# Patient Record
Sex: Female | Born: 1960
Health system: Southern US, Community
[De-identification: ages and names within clinical notes are randomized; demographics above are authoritative.]

## PROBLEM LIST (undated history)

## (undated) DIAGNOSIS — D509 Iron deficiency anemia, unspecified: Secondary | ICD-10-CM

## (undated) DIAGNOSIS — R000 Tachycardia, unspecified: Secondary | ICD-10-CM

## (undated) DIAGNOSIS — I10 Essential (primary) hypertension: Secondary | ICD-10-CM

## (undated) DIAGNOSIS — K219 Gastro-esophageal reflux disease without esophagitis: Secondary | ICD-10-CM

## (undated) DIAGNOSIS — R011 Cardiac murmur, unspecified: Secondary | ICD-10-CM

## (undated) DIAGNOSIS — E559 Vitamin D deficiency, unspecified: Secondary | ICD-10-CM

## (undated) DIAGNOSIS — E0590 Thyrotoxicosis, unspecified without thyrotoxic crisis or storm: Secondary | ICD-10-CM

## (undated) HISTORY — PX: ABDOMINAL HYSTERECTOMY: SHX81

## (undated) HISTORY — DX: Essential (primary) hypertension: I10

## (undated) HISTORY — PX: BREAST CYST ASPIRATION: SHX578

## (undated) HISTORY — DX: Cardiac murmur, unspecified: R01.1

## (undated) HISTORY — DX: Tachycardia, unspecified: R00.0

## (undated) HISTORY — DX: Thyrotoxicosis, unspecified without thyrotoxic crisis or storm: E05.90

## (undated) HISTORY — DX: Vitamin D deficiency, unspecified: E55.9

## (undated) HISTORY — DX: Gastro-esophageal reflux disease without esophagitis: K21.9

## (undated) HISTORY — PX: BREAST EXCISIONAL BIOPSY: SUR124

---
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
## 1999-04-13 HISTORY — PX: VAGINAL HYSTERECTOMY: SHX2639

## 2004-10-11 ENCOUNTER — Emergency Department (HOSPITAL_COMMUNITY): Admission: EM | Admit: 2004-10-11 | Discharge: 2004-10-11 | Payer: Self-pay | Admitting: Emergency Medicine

## 2008-07-18 ENCOUNTER — Encounter: Admission: RE | Admit: 2008-07-18 | Discharge: 2008-07-18 | Payer: Self-pay | Admitting: General Surgery

## 2012-06-12 ENCOUNTER — Ambulatory Visit: Payer: PRIVATE HEALTH INSURANCE | Admitting: Family Medicine

## 2012-06-14 ENCOUNTER — Encounter: Payer: Self-pay | Admitting: Family Medicine

## 2012-06-14 ENCOUNTER — Ambulatory Visit (INDEPENDENT_AMBULATORY_CARE_PROVIDER_SITE_OTHER): Payer: BC Managed Care – PPO | Admitting: Family Medicine

## 2012-06-14 VITALS — BP 140/82 | HR 91 | Temp 97.4°F | Ht 68.0 in | Wt 228.2 lb

## 2012-06-14 DIAGNOSIS — I10 Essential (primary) hypertension: Secondary | ICD-10-CM | POA: Insufficient documentation

## 2012-06-14 DIAGNOSIS — M7582 Other shoulder lesions, left shoulder: Secondary | ICD-10-CM

## 2012-06-14 DIAGNOSIS — M719 Bursopathy, unspecified: Secondary | ICD-10-CM

## 2012-06-14 DIAGNOSIS — E559 Vitamin D deficiency, unspecified: Secondary | ICD-10-CM | POA: Insufficient documentation

## 2012-06-14 DIAGNOSIS — M67919 Unspecified disorder of synovium and tendon, unspecified shoulder: Secondary | ICD-10-CM

## 2012-06-14 DIAGNOSIS — R011 Cardiac murmur, unspecified: Secondary | ICD-10-CM | POA: Insufficient documentation

## 2012-06-14 DIAGNOSIS — E0590 Thyrotoxicosis, unspecified without thyrotoxic crisis or storm: Secondary | ICD-10-CM | POA: Insufficient documentation

## 2012-06-14 DIAGNOSIS — K219 Gastro-esophageal reflux disease without esophagitis: Secondary | ICD-10-CM | POA: Insufficient documentation

## 2012-06-14 DIAGNOSIS — Z79899 Other long term (current) drug therapy: Secondary | ICD-10-CM

## 2012-06-14 LAB — BASIC METABOLIC PANEL
BUN: 10 mg/dL (ref 6–23)
CO2: 29 mEq/L (ref 19–32)
Chloride: 104 mEq/L (ref 96–112)
GFR: 110.49 mL/min (ref >60.00)
Glucose, Bld: 104 mg/dL — ABNORMAL HIGH (ref 70–99)
Sodium: 140 mEq/L (ref 135–145)

## 2012-06-14 MED ORDER — POTASSIUM CHLORIDE CRYS ER 20 MEQ PO TBCR: 20.0000 meq | EXTENDED_RELEASE_TABLET | Freq: Every day | ORAL | Status: DC

## 2012-06-14 MED ORDER — HYDROCHLOROTHIAZIDE 12.5 MG PO CAPS: 12.5000 mg | ORAL_CAPSULE | Freq: Every day | ORAL | Status: DC

## 2012-06-14 MED ORDER — METOPROLOL TARTRATE 50 MG PO TABS: 50.0000 mg | ORAL_TABLET | Freq: Two times a day (BID) | ORAL | Status: DC

## 2012-06-14 NOTE — Progress Notes (Signed)
Nature conservation officer at [facility] [address] [place] [address] Phone: [phone] Fax: [phone]  Date:  06/14/2012   Name:  Annette Romero   DOB:  [birth year]   MRN:  [id number] Gender: female Age: 52 y.o.  Primary Physician:  Hannah Beat, MD  Evaluating MD: Hannah Beat, MD   Chief Complaint: Establish Care   History of Present Illness:  Annette Romero is a 52 y.o. pleasant patient who presents with the following:  52 year old patient:   50 hours a week working  HTN. Has been stable on current meds.   GERD, stable on prilosec  Hyperthyroid: sees Dr. Chestine Spore, considering ablation, but hesitant and controlled on Tapazole.  Fell at her house and could not stop herself about two months ago. And hurt her back and now her eft shoulder continues to hurt.   The patient noted above presents with shoulder pain that has been ongoing for 2 months.  Fell down stairs 2 mo ago The patient denies neck pain or radicular symptoms. Denies dislocation, subluxation, separation of the shoulder. The patient does complain of pain in the overhead plane.  Medications Tried: motrin, tylenol Tried PT: No  Prior shoulder Injury: No Prior surgery: No Prior fracture: No   Patient Active Problem List  Diagnosis  . Hyperthyroidism  . Heart murmur  . Vitamin D deficiency  . Hypertension  . GERD (gastroesophageal reflux disease)    Past Medical History  Diagnosis Date  . Hyperthyroidism   . Heart murmur   . Tachycardia     with increased thyroid level  . Vitamin D deficiency   . Hypertension   . GERD (gastroesophageal reflux disease)     Past Surgical History  Procedure Laterality Date  . Vaginal hysterectomy  2001    heavy bleeding, not cancer  . Cesarean section  1991  . Breast biopsy  1974    breast cyst    History   Social History  . Marital Status: Married    Spouse Name: N/A    Number of Children: 2  . Years of Education: N/A   Occupational  History  . 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 Smoking status: Never Smoker   . Smokeless tobacco: Not on file  . Alcohol Use: No  . Drug Use: No  . Sexually Active: Not on file   Other Topics Concern  . Not on file   Social History Narrative   Separated   2 children   Son, 62, lives in [place], [address], [place]. [place]    Family History  Problem Relation Age of Onset  . CVA Father   . Heart attack Father   . Drug abuse Brother     d/c from drugs    Allergies  Allergen Reactions  . Zantac (Ranitidine Hcl) Rash    No current outpatient prescriptions on file prior to visit.   No current facility-administered medications on file prior to visit.     Review of Systems:   GEN: No fevers, chills. Nontoxic. Primarily MSK c/o today. MSK: Detailed in the HPI GI: tolerating PO intake without difficulty Neuro: No numbness, parasthesias, or tingling associated. Otherwise the pertinent positives of the ROS are noted above.    Physical Examination: BP 140/82  Pulse 91  Temp(Src) 97.4 F (36.3 C) (Oral)  Ht 5\' 8"  (1.727 m)  Wt 228 lb 4 oz (103.534 kg)  BMI 34.71  kg/m2  SpO2 99%  Ideal Body Weight: Weight in (lb) to have BMI = 25: 164.1   GEN: Well-developed,well-nourished,in no acute distress; alert,appropriate and cooperative throughout examination HEENT: Normocephalic and atraumatic without obvious abnormalities. Ears, externally no deformities PULM: Breathing comfortably in no respiratory distress CV: RRR, 2/6 SEM EXT: No clubbing, cyanosis, or edema PSYCH: Normally interactive. Cooperative during the interview. Pleasant. Friendly and conversant. Not anxious or depressed appearing. Normal, full affect.  Shoulder: L Inspection: No muscle wasting or winging Ecchymosis/edema: neg  AC joint, scapula, clavicle: NT Cervical spine: NT, full ROM Spurling's: neg Abduction: full, 5/5 Flexion: full, 5/5 IR, full, lift-off: 5/5 ER at  neutral: full, 5/5 AC crossover: neg Neer: pos Hawkins: pos Drop Test: neg Empty Can: pos Supraspinatus insertion: mild-mod T Bicipital groove: NT Speed's: neg Yergason's: neg Sulcus sign: neg Scapular dyskinesis: none C5-T1 intact  Neuro: Sensation intact Grip 5/5   Assessment and Plan:  Rotator cuff tendonitis, left : AAOS RTC and scap stab, f/u if not improving and we can think about imaging, inj, pt  Encounter for long-term (current) use of other medications - Plan: Basic metabolic panel  Hyperthyroidism: cont to f/u with Dr. Chestine Spore  Heart murmur  Vitamin D deficiency: cont D  Hypertension: cont meds  GERD (gastroesophageal reflux disease)  Orders Today:  Orders Placed This Encounter  Procedures  . Basic metabolic panel    Updated Medication List: (Includes new medications, updates to list, dose adjustments) Meds ordered this encounter  Medications  . aspirin 81 MG tablet    Sig: Take 81 mg by mouth daily.  Marland Kitchen DISCONTD: hydrochlorothiazide (MICROZIDE) 12.5 MG capsule    Sig: Take 12.5 mg by mouth daily.  Marland Kitchen DISCONTD: potassium chloride SA (K-DUR,KLOR-CON) 20 MEQ tablet    Sig: Take 20 mEq by mouth daily.  Marland Kitchen DISCONTD: metoprolol (LOPRESSOR) 50 MG tablet    Sig: Take 50 mg by mouth 2 (two) times daily.  . methimazole (TAPAZOLE) 10 MG tablet    Sig: Take 10 mg by mouth daily.  . traZODone (DESYREL) 100 MG tablet    Sig: Take 100 mg by mouth at bedtime.  . Vitamin D, Ergocalciferol, (DRISDOL) 50000 UNITS CAPS    Sig: Take 50,000 Units by mouth 2 (two) times a week.  Marland Kitchen omeprazole (PRILOSEC OTC) 20 MG tablet    Sig: Take 20 mg by mouth daily.  . hydrochlorothiazide (MICROZIDE) 12.5 MG capsule    Sig: Take 1 capsule (12.5 mg total) by mouth daily.    Dispense:  90 capsule    Refill:  3  . metoprolol (LOPRESSOR) 50 MG tablet    Sig: Take 1 tablet (50 mg total) by mouth 2 (two) times daily.    Dispense:  90 tablet    Refill:  3  . potassium chloride SA  (K-DUR,KLOR-CON) 20 MEQ tablet    Sig: Take 1 tablet (20 mEq total) by mouth daily.    Dispense:  90 tablet    Refill:  3    Medications Discontinued: Medications Discontinued During This Encounter  Medication Reason  . hydrochlorothiazide (MICROZIDE) 12.5 MG capsule Reorder  . metoprolol (LOPRESSOR) 50 MG tablet Reorder  . potassium chloride SA (K-DUR,KLOR-CON) 20 MEQ tablet Reorder      Signed, Karleen Hampshire T. Copland, MD 06/14/2012 10:24 AM

## 2012-06-29 ENCOUNTER — Encounter: Payer: Self-pay | Admitting: Obstetrics & Gynecology

## 2012-06-29 ENCOUNTER — Ambulatory Visit (INDEPENDENT_AMBULATORY_CARE_PROVIDER_SITE_OTHER): Payer: BC Managed Care – PPO | Admitting: Obstetrics & Gynecology

## 2012-06-29 DIAGNOSIS — Z01419 Encounter for gynecological examination (general) (routine) without abnormal findings: Secondary | ICD-10-CM

## 2012-06-29 DIAGNOSIS — Z1239 Encounter for other screening for malignant neoplasm of breast: Secondary | ICD-10-CM

## 2012-06-29 NOTE — Progress Notes (Signed)
Gynecology Clinic Progress Note  Subjective:     Annette Romero is a 52 y.o. female and is here for a gynecologic physical exam. The patient reports no GYN concerns. Was recently seen by her PCP and had an annual exam; she needs breast and pelvic exam. Not sexuallly active.  History   Social History  . Marital Status: Married    Spouse Name: N/A    Number of Children: 2  . Years of Education: N/A   Occupational History  . 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 Smoking status: Never Smoker   . Smokeless tobacco: Not on file  . Alcohol Use: No  . Drug Use: No  . Sexually Active: Not on file   Other Topics Concern  . Not on file   Social History Narrative   Separated   2 children   Son, 29, lives in [place], [address], [place]. [place]   Health Maintenance  Topic Date Due  . Influenza Vaccine  12/12/1958  . Pap Smear  11/07/1976  . Tetanus/tdap  11/07/1977  . Mammogram  11/07/2008  . Colonoscopy  11/07/2008   The following portions of the patient's history were reviewed and updated as appropriate: allergies, current medications, past family history, past medical history, past social history, past surgical history and problem list.  Review of Systems: Negative apart from is discussed above  Objective:   There were no vitals taken for this visit. GENERAL: Well-developed, well-nourished female in no acute distress.  BREASTS: Symmetric in size. No masses, skin changes, nipple drainage, or lymphadenopathy. ABDOMEN: Soft, nontender, nondistended. No organomegaly. PELVIC: Normal external female genitalia. Vagina is pink and rugated; well-healed vaginal cuff.  Normal discharge.  No abnormal masses palpated during bimanual. No adnexal mass or tenderness.  EXTREMITIES: No cyanosis, clubbing, or edema, 2+ distal pulses.   Assessment:    Healthy female exam.    Plan:   Mammogram scheduled Routine preventative health maintenance measures  emphasized

## 2012-06-29 NOTE — Patient Instructions (Signed)
Preventive Care for Adults, Female A healthy lifestyle and preventive care can promote health and wellness. Preventive health guidelines for women include the following key practices.  A routine yearly physical is a good way to check with your caregiver about your health and preventive screening. It is a chance to share any concerns and updates on your health, and to receive a thorough exam.  Visit your dentist for a routine exam and preventive care every 6 months. Brush your teeth twice a day and floss once a day. Good oral hygiene prevents tooth decay and gum disease.  The frequency of eye exams is based on your age, health, family medical history, use of contact lenses, and other factors. Follow your caregiver's recommendations for frequency of eye exams.  Eat a healthy diet. Foods like vegetables, fruits, whole grains, low-fat dairy products, and lean protein foods contain the nutrients you need without too many calories. Decrease your intake of foods high in solid fats, added sugars, and salt. Eat the right amount of calories for you.Get information about a proper diet from your caregiver, if necessary.  Regular physical exercise is one of the most important things you can do for your health. Most adults should get at least 150 minutes of moderate-intensity exercise (any activity that increases your heart rate and causes you to sweat) each week. In addition, most adults need muscle-strengthening exercises on 2 or more days a week.  Maintain a healthy weight. The body mass index (BMI) is a screening tool to identify possible weight problems. It provides an estimate of body fat based on height and weight. Your caregiver can help determine your BMI, and can help you achieve or maintain a healthy weight.For adults 20 years and older:  A BMI below 18.5 is considered underweight.  A BMI of 18.5 to 24.9 is normal.  A BMI of 25 to 29.9 is considered overweight.  A BMI of 30 and above is  considered obese.  Maintain normal blood lipids and cholesterol levels by exercising and minimizing your intake of saturated fat. Eat a balanced diet with plenty of fruit and vegetables. Blood tests for lipids and cholesterol should begin at age 20 and be repeated every 5 years. If your lipid or cholesterol levels are high, you are over 50, or you are at high risk for heart disease, you may need your cholesterol levels checked more frequently.Ongoing high lipid and cholesterol levels should be treated with medicines if diet and exercise are not effective.  If you smoke, find out from your caregiver how to quit. If you do not use tobacco, do not start.  If you are pregnant, do not drink alcohol. If you are breastfeeding, be very cautious about drinking alcohol. If you are not pregnant and choose to drink alcohol, do not exceed 1 drink per day. One drink is considered to be 12 ounces (355 mL) of beer, 5 ounces (148 mL) of wine, or 1.5 ounces (44 mL) of liquor.  Avoid use of street drugs. Do not share needles with anyone. Ask for help if you need support or instructions about stopping the use of drugs.  High blood pressure causes heart disease and increases the risk of stroke. Your blood pressure should be checked at least every 1 to 2 years. Ongoing high blood pressure should be treated with medicines if weight loss and exercise are not effective.  If you are 54 to 54 years old, ask your caregiver if you should take aspirin to prevent strokes.  Diabetes   screening involves taking a blood sample to check your fasting blood sugar level. This should be done once every 3 years, after age 54, if you are within normal weight and without risk factors for diabetes. Testing should be considered at a younger age or be carried out more frequently if you are overweight and have at least 1 risk factor for diabetes.  Breast cancer screening is essential preventive care for women. You should practice "breast  self-awareness." This means understanding the normal appearance and feel of your breasts and may include breast self-examination. Any changes detected, no matter how small, should be reported to a caregiver. Women in their 20s and 30s should have a clinical breast exam (CBE) by a caregiver as part of a regular health exam every 1 to 3 years. After age 40, women should have a CBE every year. Starting at age 40, women should consider having a mammography (breast X-ray test) every year. Women who have a family history of breast cancer should talk to their caregiver about genetic screening. Women at a high risk of breast cancer should talk to their caregivers about having magnetic resonance imaging (MRI) and a mammography every year.  The Pap test is a screening test for cervical cancer. A Pap test can show cell changes on the cervix that might become cervical cancer if left untreated. A Pap test is a procedure in which cells are obtained and examined from the lower end of the uterus (cervix).  Women should have a Pap test starting at age 21.  Between ages 21 and 29, Pap tests should be repeated every 2 years.  Beginning at age 30, you should have a Pap test every 3 years as long as the past 3 Pap tests have been normal.  Some women have medical problems that increase the chance of getting cervical cancer. Talk to your caregiver about these problems. It is especially important to talk to your caregiver if a new problem develops soon after your last Pap test. In these cases, your caregiver may recommend more frequent screening and Pap tests.  The above recommendations are the same for women who have or have not gotten the vaccine for human papillomavirus (HPV).  If you had a hysterectomy for a problem that was not cancer or a condition that could lead to cancer, then you no longer need Pap tests. Even if you no longer need a Pap test, a regular exam is a good idea to make sure no other problems are  starting.  If you are between ages 65 and 70, and you have had normal Pap tests going back 10 years, you no longer need Pap tests. Even if you no longer need a Pap test, a regular exam is a good idea to make sure no other problems are starting.  If you have had past treatment for cervical cancer or a condition that could lead to cancer, you need Pap tests and screening for cancer for at least 20 years after your treatment.  If Pap tests have been discontinued, risk factors (such as a new sexual partner) need to be reassessed to determine if screening should be resumed.  The HPV test is an additional test that may be used for cervical cancer screening. The HPV test looks for the virus that can cause the cell changes on the cervix. The cells collected during the Pap test can be tested for HPV. The HPV test could be used to screen women aged 30 years and older, and should   be used in women of any age who have unclear Pap test results. After the age of 30, women should have HPV testing at the same frequency as a Pap test.  Colorectal cancer can be detected and often prevented. Most routine colorectal cancer screening begins at the age of 50 and continues through age 75. However, your caregiver may recommend screening at an earlier age if you have risk factors for colon cancer. On a yearly basis, your caregiver may provide home test kits to check for hidden blood in the stool. Use of a small camera at the end of a tube, to directly examine the colon (sigmoidoscopy or colonoscopy), can detect the earliest forms of colorectal cancer. Talk to your caregiver about this at age 50, when routine screening begins. Direct examination of the colon should be repeated every 5 to 10 years through age 75, unless early forms of pre-cancerous polyps or small growths are found.  Hepatitis C blood testing is recommended for all people born from 1945 through 1965 and any individual with known risks for hepatitis C.  Practice  safe sex. Use condoms and avoid high-risk sexual practices to reduce the spread of sexually transmitted infections (STIs). STIs include gonorrhea, chlamydia, syphilis, trichomonas, herpes, HPV, and human immunodeficiency virus (HIV). Herpes, HIV, and HPV are viral illnesses that have no cure. They can result in disability, cancer, and death. Sexually active women aged 25 and younger should be checked for chlamydia. Older women with new or multiple partners should also be tested for chlamydia. Testing for other STIs is recommended if you are sexually active and at increased risk.  Osteoporosis is a disease in which the bones lose minerals and strength with aging. This can result in serious bone fractures. The risk of osteoporosis can be identified using a bone density scan. Women ages 65 and over and women at risk for fractures or osteoporosis should discuss screening with their caregivers. Ask your caregiver whether you should take a calcium supplement or vitamin D to reduce the rate of osteoporosis.  Menopause can be associated with physical symptoms and risks. Hormone replacement therapy is available to decrease symptoms and risks. You should talk to your caregiver about whether hormone replacement therapy is right for you.  Use sunscreen with sun protection factor (SPF) of 30 or more. Apply sunscreen liberally and repeatedly throughout the day. You should seek shade when your shadow is shorter than you. Protect yourself by wearing long sleeves, pants, a wide-brimmed hat, and sunglasses year round, whenever you are outdoors.  Once a month, do a whole body skin exam, using a mirror to look at the skin on your back. Notify your caregiver of new moles, moles that have irregular borders, moles that are larger than a pencil eraser, or moles that have changed in shape or color.  Stay current with required immunizations.  Influenza. You need a dose every fall (or winter). The composition of the flu vaccine  changes each year, so being vaccinated once is not enough.  Pneumococcal polysaccharide. You need 1 to 2 doses if you smoke cigarettes or if you have certain chronic medical conditions. You need 1 dose at age 65 (or older) if you have never been vaccinated.  Tetanus, diphtheria, pertussis (Tdap, Td). Get 1 dose of Tdap vaccine if you are younger than age 65, are over 65 and have contact with an infant, are a healthcare worker, are pregnant, or simply want to be protected from whooping cough. After that, you need a Td   booster dose every 10 years. Consult your caregiver if you have not had at least 3 tetanus and diphtheria-containing shots sometime in your life or have a deep or dirty wound.  HPV. You need this vaccine if you are a woman age 26 or younger. The vaccine is given in 3 doses over 6 months.  Measles, mumps, rubella (MMR). You need at least 1 dose of MMR if you were born in 1957 or later. You may also need a second dose.  Meningococcal. If you are age 19 to 21 and a first-year college student living in a residence hall, or have one of several medical conditions, you need to get vaccinated against meningococcal disease. You may also need additional booster doses.  Zoster (shingles). If you are age 60 or older, you should get this vaccine.  Varicella (chickenpox). If you have never had chickenpox or you were vaccinated but received only 1 dose, talk to your caregiver to find out if you need this vaccine.  Hepatitis A. You need this vaccine if you have a specific risk factor for hepatitis A virus infection or you simply wish to be protected from this disease. The vaccine is usually given as 2 doses, 6 to 18 months apart.  Hepatitis B. You need this vaccine if you have a specific risk factor for hepatitis B virus infection or you simply wish to be protected from this disease. The vaccine is given in 3 doses, usually over 6 months. Preventive Services / Frequency Ages 19 to 39  Blood  pressure check.** / Every 1 to 2 years.  Lipid and cholesterol check.** / Every 5 years beginning at age 20.  Clinical breast exam.** / Every 3 years for women in their 20s and 30s.  Pap test.** / Every 2 years from ages 21 through 29. Every 3 years starting at age 30 through age 65 or 70 with a history of 3 consecutive normal Pap tests.  HPV screening.** / Every 3 years from ages 30 through ages 65 to 70 with a history of 3 consecutive normal Pap tests.  Hepatitis C blood test.** / For any individual with known risks for hepatitis C.  Skin self-exam. / Monthly.  Influenza immunization.** / Every year.  Pneumococcal polysaccharide immunization.** / 1 to 2 doses if you smoke cigarettes or if you have certain chronic medical conditions.  Tetanus, diphtheria, pertussis (Tdap, Td) immunization. / A one-time dose of Tdap vaccine. After that, you need a Td booster dose every 10 years.  HPV immunization. / 3 doses over 6 months, if you are 26 and younger.  Measles, mumps, rubella (MMR) immunization. / You need at least 1 dose of MMR if you were born in 1957 or later. You may also need a second dose.  Meningococcal immunization. / 1 dose if you are age 19 to 21 and a first-year college student living in a residence hall, or have one of several medical conditions, you need to get vaccinated against meningococcal disease. You may also need additional booster doses.  Varicella immunization.** / Consult your caregiver.  Hepatitis A immunization.** / Consult your caregiver. 2 doses, 6 to 18 months apart.  Hepatitis B immunization.** / Consult your caregiver. 3 doses usually over 6 months. Ages 40 to 64  Blood pressure check.** / Every 1 to 2 years.  Lipid and cholesterol check.** / Every 5 years beginning at age 20.  Clinical breast exam.** / Every year after age 40.  Mammogram.** / Every year beginning at age 40   and continuing for as long as you are in good health. Consult with your  caregiver.  Pap test.** / Every 3 years starting at age 30 through age 65 or 70 with a history of 3 consecutive normal Pap tests.  HPV screening.** / Every 3 years from ages 30 through ages 65 to 70 with a history of 3 consecutive normal Pap tests.  Fecal occult blood test (FOBT) of stool. / Every year beginning at age 50 and continuing until age 75. You may not need to do this test if you get a colonoscopy every 10 years.  Flexible sigmoidoscopy or colonoscopy.** / Every 5 years for a flexible sigmoidoscopy or every 10 years for a colonoscopy beginning at age 50 and continuing until age 75.  Hepatitis C blood test.** / For all people born from 1945 through 1965 and any individual with known risks for hepatitis C.  Skin self-exam. / Monthly.  Influenza immunization.** / Every year.  Pneumococcal polysaccharide immunization.** / 1 to 2 doses if you smoke cigarettes or if you have certain chronic medical conditions.  Tetanus, diphtheria, pertussis (Tdap, Td) immunization.** / A one-time dose of Tdap vaccine. After that, you need a Td booster dose every 10 years.  Measles, mumps, rubella (MMR) immunization. / You need at least 1 dose of MMR if you were born in 1957 or later. You may also need a second dose.  Varicella immunization.** / Consult your caregiver.  Meningococcal immunization.** / Consult your caregiver.  Hepatitis A immunization.** / Consult your caregiver. 2 doses, 6 to 18 months apart.  Hepatitis B immunization.** / Consult your caregiver. 3 doses, usually over 6 months. Ages 65 and over  Blood pressure check.** / Every 1 to 2 years.  Lipid and cholesterol check.** / Every 5 years beginning at age 20.  Clinical breast exam.** / Every year after age 40.  Mammogram.** / Every year beginning at age 40 and continuing for as long as you are in good health. Consult with your caregiver.  Pap test.** / Every 3 years starting at age 30 through age 65 or 70 with a 3  consecutive normal Pap tests. Testing can be stopped between 65 and 70 with 3 consecutive normal Pap tests and no abnormal Pap or HPV tests in the past 10 years.  HPV screening.** / Every 3 years from ages 30 through ages 65 or 70 with a history of 3 consecutive normal Pap tests. Testing can be stopped between 65 and 70 with 3 consecutive normal Pap tests and no abnormal Pap or HPV tests in the past 10 years.  Fecal occult blood test (FOBT) of stool. / Every year beginning at age 50 and continuing until age 75. You may not need to do this test if you get a colonoscopy every 10 years.  Flexible sigmoidoscopy or colonoscopy.** / Every 5 years for a flexible sigmoidoscopy or every 10 years for a colonoscopy beginning at age 50 and continuing until age 75.  Hepatitis C blood test.** / For all people born from 1945 through 1965 and any individual with known risks for hepatitis C.  Osteoporosis screening.** / A one-time screening for women ages 65 and over and women at risk for fractures or osteoporosis.  Skin self-exam. / Monthly.  Influenza immunization.** / Every year.  Pneumococcal polysaccharide immunization.** / 1 dose at age 65 (or older) if you have never been vaccinated.  Tetanus, diphtheria, pertussis (Tdap, Td) immunization. / A one-time dose of Tdap vaccine if you are over   65 and have contact with an infant, are a healthcare worker, or simply want to be protected from whooping cough. After that, you need a Td booster dose every 10 years.  Varicella immunization.** / Consult your caregiver.  Meningococcal immunization.** / Consult your caregiver.  Hepatitis A immunization.** / Consult your caregiver. 2 doses, 6 to 18 months apart.  Hepatitis B immunization.** / Check with your caregiver. 3 doses, usually over 6 months. ** Family history and personal history of risk and conditions may change your caregiver's recommendations. Document Released: 05/25/2001 Document Revised: 06/21/2011  Document Reviewed: 08/24/2010 [employer] Patient Information 2013 [employer].  

## 2012-07-06 ENCOUNTER — Ambulatory Visit (HOSPITAL_COMMUNITY)
Admission: RE | Admit: 2012-07-06 | Discharge: 2012-07-06 | Disposition: A | Discharge status: Home / Self Care | Payer: BC Managed Care – PPO | Source: Ambulatory Visit | Attending: Obstetrics & Gynecology | Admitting: Obstetrics & Gynecology

## 2012-07-06 DIAGNOSIS — Z1239 Encounter for other screening for malignant neoplasm of breast: Secondary | ICD-10-CM

## 2012-07-06 DIAGNOSIS — Z1231 Encounter for screening mammogram for malignant neoplasm of breast: Secondary | ICD-10-CM | POA: Insufficient documentation

## 2012-07-06 DIAGNOSIS — Z01419 Encounter for gynecological examination (general) (routine) without abnormal findings: Secondary | ICD-10-CM

## 2012-08-10 ENCOUNTER — Other Ambulatory Visit: Payer: Self-pay

## 2012-08-10 MED ORDER — TRAZODONE HCL 100 MG PO TABS: 100.0000 mg | ORAL_TABLET | Freq: Every day | ORAL | Status: DC

## 2012-08-10 NOTE — Telephone Encounter (Signed)
Refilled electronically as directed.

## 2012-08-10 NOTE — Telephone Encounter (Signed)
Ok #30, 3 refills

## 2012-08-10 NOTE — Telephone Encounter (Signed)
Pt left v/m requesting refill Trazodone to [facility]; pt had tried taking Melatonin but did not help pt sleep.Please advise.

## 2012-12-26 ENCOUNTER — Telehealth: Payer: Self-pay | Admitting: Family Medicine

## 2012-12-26 NOTE — Telephone Encounter (Signed)
Annette Romero dropped off a physcial form to be filed out.

## 2012-12-26 NOTE — Telephone Encounter (Signed)
Routine Preventive Care Form placed in Dr. Cyndie Chime in box.

## 2012-12-26 NOTE — Telephone Encounter (Signed)
i will review tomorrow.

## 2012-12-27 NOTE — Telephone Encounter (Signed)
Per Dr. Patsy Lager.  He has not done a complete physical for this patient, so he is unable to sign form.  Left message for patient to return phone call.

## 2012-12-28 NOTE — Telephone Encounter (Signed)
Left message for patient to return my call.

## 2012-12-29 NOTE — Telephone Encounter (Signed)
Patient notified as instructed by telephone.  CPE scheduled for 01/01/2013 @ 10:30am.  Ileana Ladd

## 2013-01-01 ENCOUNTER — Ambulatory Visit (INDEPENDENT_AMBULATORY_CARE_PROVIDER_SITE_OTHER): Payer: BC Managed Care – PPO | Admitting: Family Medicine

## 2013-01-01 ENCOUNTER — Encounter: Payer: Self-pay | Admitting: Family Medicine

## 2013-01-01 VITALS — BP 110/78 | HR 90 | Temp 97.8°F | Ht 68.0 in | Wt 227.5 lb

## 2013-01-01 DIAGNOSIS — I10 Essential (primary) hypertension: Secondary | ICD-10-CM

## 2013-01-01 DIAGNOSIS — E559 Vitamin D deficiency, unspecified: Secondary | ICD-10-CM

## 2013-01-01 DIAGNOSIS — R5383 Other fatigue: Secondary | ICD-10-CM

## 2013-01-01 DIAGNOSIS — E0590 Thyrotoxicosis, unspecified without thyrotoxic crisis or storm: Secondary | ICD-10-CM

## 2013-01-01 DIAGNOSIS — Z0000 Encounter for general adult medical examination without abnormal findings: Secondary | ICD-10-CM

## 2013-01-01 DIAGNOSIS — Z13220 Encounter for screening for lipoid disorders: Secondary | ICD-10-CM

## 2013-01-01 DIAGNOSIS — R5381 Other malaise: Secondary | ICD-10-CM

## 2013-01-01 DIAGNOSIS — Z1211 Encounter for screening for malignant neoplasm of colon: Secondary | ICD-10-CM

## 2013-01-01 LAB — CBC WITH DIFFERENTIAL/PLATELET
Basophils Relative: 0.5 % (ref 0.0–3.0)
HCT: 38.7 % (ref 36.0–46.0)
Hemoglobin: 12.8 g/dL (ref 12.0–15.0)
Monocytes Absolute: 0.5 10*3/uL (ref 0.1–1.0)
Monocytes Relative: 7 % (ref 3.0–12.0)
Neutro Abs: 3.6 10*3/uL (ref 1.4–7.7)
Platelets: 263 10*3/uL (ref 150.0–400.0)
RDW: 14.2 % (ref 11.5–14.6)

## 2013-01-01 LAB — BASIC METABOLIC PANEL
BUN: 10 mg/dL (ref 6–23)
CO2: 27 mEq/L (ref 19–32)
Chloride: 106 mEq/L (ref 96–112)
Creatinine, Ser: 0.8 mg/dL (ref 0.4–1.2)
GFR: 94.71 mL/min (ref >60.00)
Sodium: 139 mEq/L (ref 135–145)

## 2013-01-01 LAB — HEPATIC FUNCTION PANEL
ALT: 18 U/L (ref 0–35)
AST: 21 U/L (ref 0–37)
Albumin: 3.8 g/dL (ref 3.5–5.2)
Alkaline Phosphatase: 79 U/L (ref 39–117)
Total Bilirubin: 0.5 mg/dL (ref 0.3–1.2)
Total Protein: 7.5 g/dL (ref 6.0–8.3)

## 2013-01-01 LAB — LIPID PANEL
HDL: 49.9 mg/dL (ref >39.00)
VLDL: 11.8 mg/dL (ref 0.0–40.0)

## 2013-01-01 LAB — TSH: TSH: 0.59 u[IU]/mL (ref 0.35–5.50)

## 2013-01-01 NOTE — Progress Notes (Signed)
Nature conservation officer at [facility] [address] [place] [address] Phone: [phone] Fax: [phone]  Date:  01/01/2013   Name:  Annette Romero   DOB:  09/27/1960   MRN:  [id number] Gender: female Age: 52 y.o.  Primary Physician:  Annette Romero, Annette Romero  Evaluating Annette Romero: Annette Romero, Annette Romero   Chief Complaint: Annual Exam   History of Present Illness:  Annette Romero is a 51 y.o. pleasant patient who presents with the following:  CPX:  Colon  March - went to GYN Endocrinologist - sees Dr. Chestine Spore. 6-7 months ago was last time thyroid.   Vitamin D level  Health Maintenance Summary Reviewed and updated, unless pt declines services.  Tobacco History Reviewed. Non-smoker Alcohol: No concerns, no excessive use Exercise Habits: Some activity, rec at least 30 mins 5 times a week STD concerns: none Drug Use: None Birth control method: Menses regular: yes Lumps or breast concerns: no Breast Cancer Family History: no  Health Maintenance  Topic Date Due  . Pap Smear  11/07/1976  . Tetanus/tdap  11/07/1977  . Colonoscopy  11/07/2008  . Influenza Vaccine  11/10/2012  . Mammogram  07/07/2014    Labs reviewed with the patient.  Results for orders placed in visit on 06/14/12  BASIC METABOLIC PANEL      Result Value Range   Sodium 140  135 - 145 mEq/L   Potassium 3.2 (*) 3.5 - 5.1 mEq/L   Chloride 104  96 - 112 mEq/L   CO2 29  19 - 32 mEq/L   Glucose, Bld 104 (*) 70 - 99 mg/dL   BUN 10  6 - 23 mg/dL   Creatinine, Ser 0.7  0.4 - 1.2 mg/dL   Calcium 9.4  8.4 - 86.5 mg/dL   GFR 784.69  >62.95 mL/min    GYN OV in 06/2012  Patient Active Problem List   Diagnosis Date Noted  . Hyperthyroidism   . Heart murmur   . Vitamin D deficiency   . Hypertension   . GERD (gastroesophageal reflux disease)     Past Medical History  Diagnosis Date  . Hyperthyroidism   . Heart murmur   . Tachycardia     with increased thyroid level  . Vitamin D deficiency   .  Hypertension   . GERD (gastroesophageal reflux disease)     Past Surgical History  Procedure Laterality Date  . Vaginal hysterectomy  2001    heavy bleeding due to fibroids, not cancer  . Cesarean section  1991  . Breast biopsy  1974    breast cyst    History   Social History  . Marital Status: Married    Spouse Name: N/A    Number of Children: 2  . Years of Education: N/A   Occupational History  . nurse     LPN at [employer]   Social History Main Topics  . Smoking status: Never Smoker   . Smokeless tobacco: Never Used  . Alcohol Use: No  . Drug Use: No  . Sexual Activity: Not on file   Other Topics Concern  . Not on file   Social History Narrative   Separated   2 children   Son, 7, lives in [place], [address], [place]. [place]    Family History  Problem Relation Age of Onset  . CVA Father   . Heart attack Father   . Drug abuse Brother     d/c from drugs    Allergies  Allergen Reactions  . Zantac [Ranitidine Hcl] Rash    Medication list has been reviewed and updated.  Outpatient Prescriptions Prior to Visit  Medication Sig Dispense Refill  . aspirin 81 MG tablet Take 81 mg by mouth daily.      . hydrochlorothiazide (MICROZIDE) 12.5 MG capsule Take 1 capsule (12.5 mg total) by mouth daily.  90 capsule  3  . metoprolol (LOPRESSOR) 50 MG tablet Take 1 tablet (50 mg total) by mouth 2 (two) times daily.  90 tablet  3  . Multiple Vitamin (MULTIVITAMIN) capsule Take 1 capsule by mouth daily.      Marland Kitchen omeprazole (PRILOSEC OTC) 20 MG tablet Take 20 mg by mouth as needed.       . potassium chloride SA (K-DUR,KLOR-CON) 20 MEQ tablet Take 1 tablet (20 mEq total) by mouth daily.  90 tablet  3  . traZODone (DESYREL) 100 MG tablet Take 1 tablet (100 mg total) by mouth at bedtime.  30 tablet  3  . methimazole (TAPAZOLE) 10 MG tablet Take 10 mg by mouth daily.       No facility-administered medications prior to visit.    Review of Systems:   General: Denies  fever, chills, sweats. No significant weight loss. Eyes: Denies blurring,significant itching ENT: Denies earache, sore throat, and hoarseness.  Cardiovascular: Denies chest pains, palpitations, dyspnea on exertion,  Respiratory: Denies cough, dyspnea at rest,wheeezing Breast: no concerns about lumps GI: Denies nausea, vomiting, diarrhea, constipation, change in bowel habits, abdominal pain, melena, hematochezia GU: Denies dysuria, hematuria, urinary hesitancy, nocturia, denies STD risk, no concerns about discharge Musculoskeletal: Denies back pain, joint pain Derm: Denies rash, itching Neuro: Denies  paresthesias, frequent falls, frequent headaches Psych: Denies depression, anxiety Endocrine: Denies cold intolerance, heat intolerance, polydipsia Heme: Denies enlarged lymph nodes Allergy: No hayfever  Physical Examination: BP 110/78  Pulse 90  Temp(Src) 97.8 F (36.6 C) (Oral)  Ht 5\' 8"  (1.727 m)  Wt 227 lb 8 oz (103.193 kg)  BMI 34.6 kg/m2  Ideal Body Weight: Weight in (lb) to have BMI = 25: 164.1   GEN: well developed, well nourished, no acute distress Eyes: conjunctiva and lids normal, PERRLA, EOMI ENT: TM clear, nares clear, oral exam WNL Neck: supple, no lymphadenopathy, no thyromegaly, no JVD Pulm: clear to auscultation and percussion, respiratory effort normal CV: regular rate and rhythm, S1-S2, no murmur, rub or gallop, no bruits Chest: no scars, masses, no lumps BREAST: breast exam declined GI: soft, non-tender; no hepatosplenomegaly, masses; active bowel sounds all quadrants GU: GU exam declined Lymph: no cervical, axillary or inguinal adenopathy MSK: gait normal, muscle tone and strength WNL, no joint swelling, effusions, discoloration, crepitus  SKIN: clear, good turgor, color WNL, no rashes, lesions, or ulcerations Neuro: normal mental status, normal strength, sensation, and motion Psych: alert; oriented to person, place and time, normally interactive and not  anxious or depressed in appearance.    Assessment and Plan:  Routine general medical examination at a health care facility  Special screening for malignant neoplasms, colon - Plan: Ambulatory referral to Gastroenterology  Hyperthyroidism - Plan: TSH  Vitamin D deficiency - Plan: Vit D  25 hydroxy (rtn osteoporosis monitoring)  Screening for lipoid disorders - Plan: Lipid panel  Other malaise and fatigue - Plan: Basic metabolic panel, CBC with Differential, Hepatic function panel  Hypertension  The patient's preventative maintenance and recommended screening tests for an annual wellness exam were reviewed in full today. Brought up to date unless services declined.  Counselled  on the importance of diet, exercise, and its role in overall health and mortality. The patient's FH and SH was reviewed, including their home life, tobacco status, and drug and alcohol status.   Colon Check thyroid and other labs  Orders Today:  Orders Placed This Encounter  Procedures  . Basic metabolic panel  . CBC with Differential  . Hepatic function panel  . Lipid panel  . TSH  . Vit D  25 hydroxy (rtn osteoporosis monitoring)  . Ambulatory referral to Gastroenterology    Referral Priority:  Routine    Referral Type:  Consultation    Referral Reason:  Specialty Services Required    Requested Specialty:  Gastroenterology    Number of Visits Requested:  1    Updated Medication List: (Includes new medications, updates to list, dose adjustments) Meds ordered this encounter  Medications  . Ginkgo Biloba 40 MG TABS    Sig: Take 2 tablets by mouth daily.    Medications Discontinued: Medications Discontinued During This Encounter  Medication Reason  . methimazole (TAPAZOLE) 10 MG tablet Change in therapy      Signed, Aries Kasa T. Esmee Fallaw, Annette Romero 01/01/2013 10:48 AM

## 2013-01-01 NOTE — Patient Instructions (Addendum)
REFERRAL: GO THE THE FRONT ROOM AT THE ENTRANCE OF OUR CLINIC, NEAR CHECK IN. ASK FOR MARION. SHE WILL HELP YOU SET UP YOUR REFERRAL. DATE: TIME:  

## 2013-01-02 LAB — VITAMIN D 25 HYDROXY (VIT D DEFICIENCY, FRACTURES): Vit D, 25-Hydroxy: 41 ng/mL (ref 30–89)

## 2013-01-02 LAB — LDL CHOLESTEROL, DIRECT: Direct LDL: 142.2 mg/dL

## 2013-01-05 ENCOUNTER — Telehealth: Payer: Self-pay

## 2013-01-05 NOTE — Telephone Encounter (Signed)
i sent via mychart. It looks like she got them.  Labs are essentially completely normal.  Potassium is mildly low. She already takes potassium. Keep doing so. It would be reasonable but not necessary to add some dietary potassium like a banana every other day.

## 2013-01-05 NOTE — Telephone Encounter (Signed)
Patient notified as instructed by telephone. 

## 2013-01-05 NOTE — Telephone Encounter (Signed)
Pt left v/m returning call about recent lab results. Pt request cb [id number].

## 2013-01-08 ENCOUNTER — Encounter: Payer: Self-pay | Admitting: Family Medicine

## 2013-02-15 ENCOUNTER — Other Ambulatory Visit: Payer: Self-pay

## 2013-02-23 ENCOUNTER — Other Ambulatory Visit: Payer: Self-pay | Admitting: Family Medicine

## 2013-02-23 NOTE — Telephone Encounter (Signed)
Last office visit 01/01/2013.  Ok to refill?

## 2013-08-31 ENCOUNTER — Ambulatory Visit (INDEPENDENT_AMBULATORY_CARE_PROVIDER_SITE_OTHER): Payer: BC Managed Care – PPO | Admitting: Family Medicine

## 2013-08-31 ENCOUNTER — Encounter: Payer: Self-pay | Admitting: Family Medicine

## 2013-08-31 VITALS — BP 110/74 | HR 77 | Temp 97.8°F | Ht 68.0 in | Wt 234.5 lb

## 2013-08-31 DIAGNOSIS — R9431 Abnormal electrocardiogram [ECG] [EKG]: Secondary | ICD-10-CM

## 2013-08-31 DIAGNOSIS — R002 Palpitations: Secondary | ICD-10-CM

## 2013-08-31 DIAGNOSIS — E0590 Thyrotoxicosis, unspecified without thyrotoxic crisis or storm: Secondary | ICD-10-CM

## 2013-08-31 DIAGNOSIS — R079 Chest pain, unspecified: Secondary | ICD-10-CM

## 2013-08-31 NOTE — Assessment & Plan Note (Signed)
More frequent and persistent.  Pt told to stop energy supplement with green tea extract which could be contributing. EKG today normal but taken during NONevent. Recommend referralt o cardiology for likely holter monitor.  Continue current dose of BBlocker as BP and pulse currently fairly low.

## 2013-08-31 NOTE — Patient Instructions (Addendum)
Stop energy pill. Push fluids, water. Stop at front desk to set up referral. Continue metoprolol 50 mg twice daily. If severe chest pain or persistent symptoms... Go ER.

## 2013-08-31 NOTE — Assessment & Plan Note (Signed)
Some evidence of possible anterolateral heart damage... Secondary to past MI vs heart strain.  Likely need cardiac stress test and ECHO.... No acute need for ER visit etc. Refer to cards within next week.

## 2013-08-31 NOTE — Progress Notes (Signed)
Pre visit review using our clinic review tool, if applicable. No additional management support is needed unless otherwise documented below in the visit note. 

## 2013-08-31 NOTE — Assessment & Plan Note (Signed)
No records but per pt she has been occasionally compliant with tapazole, but states that recent labs test showed nml thyroid. Told to stop tapazole per Dr. Chestine Spore ENDO.

## 2013-08-31 NOTE — Progress Notes (Signed)
   Subjective:    Patient ID: Annette Romero, female    DOB: 03-26-1961, 53 y.o.   MRN: [id number]  HPI   53 year old female  Pt of Dr. Lorelei Pont with heart murmer, HTN presents with palpitations intermittantly.  This has been ongoing since 2003-2004. She was placed on BBlocker in past.   In last month she is having mores frequent episodes 1-2 times a week, lasts  longer . HR goes up 184. She did note it went down to 34 for a few minutes, then back up to 166.  Last episode 1 week ago. No chest pain but some discomfort off and on. No DOE, no SOB.   She is Chief Executive Officer, she measures O2sat, BP nml.  She is seeing Dr Loletta Specter for hyperthyroid. Called today to say nml thyroid, low vit D. Told to stop tapazole ( has been on off and on for 3-4 years)  She does not drink caffeine,occ chocolate.  Nonsmoker.  She does take energy and met pill at night ( hase gree tea extract in it) ( works 11-7AM because she feels sluggish)   Review of Systems  Constitutional: Negative for fever and fatigue.  HENT: Negative for ear pain.   Eyes: Negative for pain.  Respiratory: Negative for chest tightness and shortness of breath.   Cardiovascular: Negative for chest pain, palpitations and leg swelling.  Gastrointestinal: Negative for abdominal pain.  Genitourinary: Negative for dysuria.       Objective:   Physical Exam  Constitutional: Vital signs are normal. She appears well-developed and well-nourished. She is cooperative.  Non-toxic appearance. She does not appear ill. No distress.  overweight  HENT:  Head: Normocephalic.  Right Ear: Hearing, tympanic membrane, external ear and ear canal normal. Tympanic membrane is not erythematous, not retracted and not bulging.  Left Ear: Hearing, tympanic membrane, external ear and ear canal normal. Tympanic membrane is not erythematous, not retracted and not bulging.  Nose: No mucosal edema or rhinorrhea. Right sinus exhibits no maxillary sinus tenderness and  no frontal sinus tenderness. Left sinus exhibits no maxillary sinus tenderness and no frontal sinus tenderness.  Mouth/Throat: Uvula is midline, oropharynx is clear and moist and mucous membranes are normal.  Eyes: Conjunctivae, EOM and lids are normal. Pupils are equal, round, and reactive to light. Lids are everted and swept, no foreign bodies found.  Neck: Trachea normal and normal range of motion. Neck supple. Carotid bruit is not present. No mass and no thyromegaly present.  Cardiovascular: Normal rate, regular rhythm, S1 normal, S2 normal, normal heart sounds, intact distal pulses and normal pulses.  Exam reveals no gallop and no friction rub.   No murmur heard. Pulmonary/Chest: Effort normal and breath sounds normal. Not tachypneic. No respiratory distress. She has no decreased breath sounds. She has no wheezes. She has no rhonchi. She has no rales.  Abdominal: Soft. Normal appearance and bowel sounds are normal. There is no tenderness.  Neurological: She is alert.  Skin: Skin is warm, dry and intact. No rash noted.  Psychiatric: Her speech is normal and behavior is normal. Judgment and thought content normal. Her mood appears not anxious. Cognition and memory are normal. She does not exhibit a depressed mood.          Assessment & Plan:

## 2013-09-07 ENCOUNTER — Ambulatory Visit: Payer: BC Managed Care – PPO | Admitting: Cardiovascular Disease

## 2013-09-07 ENCOUNTER — Encounter: Payer: Self-pay | Admitting: *Deleted

## 2013-09-18 ENCOUNTER — Other Ambulatory Visit: Payer: Self-pay | Admitting: Family Medicine

## 2013-10-11 ENCOUNTER — Ambulatory Visit (INDEPENDENT_AMBULATORY_CARE_PROVIDER_SITE_OTHER): Payer: BC Managed Care – PPO | Admitting: Cardiovascular Disease

## 2013-10-11 ENCOUNTER — Encounter: Payer: Self-pay | Admitting: Cardiovascular Disease

## 2013-10-11 VITALS — BP 132/98 | HR 80 | Ht 67.5 in | Wt 237.2 lb

## 2013-10-11 DIAGNOSIS — R011 Cardiac murmur, unspecified: Secondary | ICD-10-CM

## 2013-10-11 DIAGNOSIS — R000 Tachycardia, unspecified: Secondary | ICD-10-CM

## 2013-10-11 DIAGNOSIS — E0590 Thyrotoxicosis, unspecified without thyrotoxic crisis or storm: Secondary | ICD-10-CM

## 2013-10-11 DIAGNOSIS — I471 Supraventricular tachycardia, unspecified: Secondary | ICD-10-CM | POA: Insufficient documentation

## 2013-10-11 DIAGNOSIS — E876 Hypokalemia: Secondary | ICD-10-CM

## 2013-10-11 MED ORDER — DILTIAZEM HCL ER COATED BEADS 120 MG PO CP24: 120.0000 mg | ORAL_CAPSULE | Freq: Every day | ORAL | Status: DC

## 2013-10-11 NOTE — Assessment & Plan Note (Signed)
Prior potassium 3.2. She is on significant supplemental potassium daily. This is likely secondary to the HCTZ. We will hold the HCTZ, start diltiazem as above.

## 2013-10-11 NOTE — Assessment & Plan Note (Signed)
Recent lab work showed relatively normal thyroid numbers. She is off Tapazole. Do not think this is playing a role in her arrhythmia. Suggested she hold off on any stimulants

## 2013-10-11 NOTE — Assessment & Plan Note (Signed)
No significant murmur appreciated on exam today. She would like to hold off on echocardiogram at this time.

## 2013-10-11 NOTE — Patient Instructions (Addendum)
Please hold the HCTZ  Take 1/2  potassium every other day Start diltiazem one a day at dinner  If your blood pressure runs low, Cut the metoprolol in 1/2 twice a day  Please call us if you have new issues that need to be addressed before your next appt.  Your physician wants you to follow-up in: 1 month.

## 2013-10-11 NOTE — Progress Notes (Signed)
Patient ID: Annette Romero, female    DOB: 12-08-1960, 53 y.o.   MRN: [id number]  HPI Comments: Annette Romero is a 53 year old woman with episodes of tachycardia starting back in 2002, at which time she was told that she had thyroid disease. She presents to establish care in the [place] office.  [place]. At that time she started having palpitations, episodes of severe tachycardia with acute onset. She was told she had thyroid disease, started on metoprolol. Initially was on 25 mg twice a day with titration up to 50 mg twice a day. This helped her symptoms mildly but she continued to have symptoms. She had a thyroid scan was told that she had thyroid "crisis". She was started on Tapazole 10 mg daily which improved her cardiac symptoms.   By her account, she stopped the Tapazole and symptoms have recurred over the past 1-1/2 years. She has stayed on metoprolol. She reports having 2-3 episodes per year, 6-7 episodes this year. Last episode was 2 weeks ago. It lasted 45 minutes, described as a severe tachycardia with heart rate greater than 200 beats per minute, acute onset. Typically happens when she goes to work. She has been taking some herbal supplements poor energy so she can stay awake at night. She is only recently stopped this.  Previous potassium 3.2,  9 months ago and one year ago. More recent potassium in 08/29/2013 was 4.3. She does take potassium supplementation.  She's never had a Holter monitor or echocardiogram. EKG shows normal sinus rhythm with rate 80 beats a minute, no significant ST or T wave changes  Outpatient Encounter Prescriptions as of 10/11/2013  Medication Sig  . aspirin 81 MG tablet Take 81 mg by mouth daily.  . metoprolol (LOPRESSOR) 50 MG tablet TAKE 1 TABLET (50 MG TOTAL) BY MOUTH 2 (TWO) TIMES DAILY.  Marland Kitchen. omeprazole (PRILOSEC OTC) 20 MG tablet Take 20 mg by mouth as needed.   . potassium chloride SA (K-DUR,KLOR-CON) 20 MEQ tablet Take  1 tablet (20 mEq total) by mouth daily.  . traZODone (DESYREL) 100 MG tablet TAKE 1.5 TABLET (150 MG TOTAL) BY MOUTH AT BEDTIME.  .  hydrochlorothiazide (MICROZIDE) 12.5 MG capsule TAKE 1 CAPSULE (12.5 MG TOTAL) BY MOUTH DAILY.    Review of Systems  Constitutional: Negative.   HENT: Negative.   Eyes: Negative.   Respiratory: Negative.   Cardiovascular: Positive for palpitations.       Tachycardia with acute onset lasting 30-45 minutes  Gastrointestinal: Negative.   Endocrine: Negative.   Musculoskeletal: Negative.   Skin: Negative.   Allergic/Immunologic: Negative.   Neurological: Negative.   Hematological: Negative.   Psychiatric/Behavioral: Negative.   All other systems reviewed and are negative.   BP 132/98  Pulse 80  Ht 5' 7.5" (1.715 m)  Wt 237 lb 4 oz (107.616 kg)  BMI 36.59 kg/m2  Physical Exam  Nursing note and vitals reviewed. Constitutional: She is oriented to person, place, and time. She appears well-developed and well-nourished.  HENT:  Head: Normocephalic.  Nose: Nose normal.  Mouth/Throat: Oropharynx is clear and moist.  Eyes: Conjunctivae are normal. Pupils are equal, round, and reactive to light.  Neck: Normal range of motion. Neck supple. No JVD present.  Cardiovascular: Normal rate, regular rhythm, S1 normal, S2 normal, normal heart sounds and intact distal pulses.  Exam reveals no gallop and no friction rub.   No murmur heard. Pulmonary/Chest: Effort normal and breath sounds normal. No respiratory distress. She  has no wheezes. She has no rales. She exhibits no tenderness.  Abdominal: Soft. Bowel sounds are normal. She exhibits no distension. There is no tenderness.  Musculoskeletal: Normal range of motion. She exhibits no edema and no tenderness.  Lymphadenopathy:    She has no cervical adenopathy.  Neurological: She is alert and oriented to person, place, and time. Coordination normal.  Skin: Skin is warm and dry. No rash noted. No erythema.   Psychiatric: She has a normal mood and affect. Her behavior is normal. Judgment and thought content normal.    Assessment and Plan

## 2013-10-11 NOTE — Assessment & Plan Note (Addendum)
Her story is consistent with SVT. Heart rate 200 beats per minute or more, acute onset. Dating back 10 years or more, worse this year. We have suggested she hold her HCTZ, start diltiazem 120 mg daily. She would like to hold off on Holter monitor and echocardiogram at this time and try to fix this with medication. We did discuss Valsalva and carotid sinus massage maneuvers. We also discussed going to the far department or EMTs for EKG when she has arrhythmia. Also offered for her to come to the office if we are open for EKG. we did discuss trying other arrhythmia medications that we would need a Holter prior to this. Also did discuss ablation

## 2013-11-08 ENCOUNTER — Other Ambulatory Visit: Payer: Self-pay | Admitting: Family Medicine

## 2013-11-08 NOTE — Telephone Encounter (Addendum)
Received refill request electronically from pharmacy. Refill request does not match the medication sheet which shows 1.5. Left message for patient to call back to confirm how she takes it.

## 2013-11-09 NOTE — Telephone Encounter (Signed)
Spoke with Ms. Annette Romero.  She states she is taking 1.5 tablets of trazadone at bedtime.

## 2013-11-12 ENCOUNTER — Ambulatory Visit: Payer: BC Managed Care – PPO | Admitting: Cardiovascular Disease

## 2013-11-14 ENCOUNTER — Ambulatory Visit (INDEPENDENT_AMBULATORY_CARE_PROVIDER_SITE_OTHER): Payer: BC Managed Care – PPO | Admitting: Cardiovascular Disease

## 2013-11-14 ENCOUNTER — Encounter: Payer: Self-pay | Admitting: Cardiovascular Disease

## 2013-11-14 VITALS — BP 110/72 | HR 62 | Ht 68.0 in | Wt 241.5 lb

## 2013-11-14 DIAGNOSIS — I471 Supraventricular tachycardia: Secondary | ICD-10-CM

## 2013-11-14 DIAGNOSIS — I10 Essential (primary) hypertension: Secondary | ICD-10-CM

## 2013-11-14 DIAGNOSIS — R002 Palpitations: Secondary | ICD-10-CM

## 2013-11-14 DIAGNOSIS — E0590 Thyrotoxicosis, unspecified without thyrotoxic crisis or storm: Secondary | ICD-10-CM

## 2013-11-14 DIAGNOSIS — E559 Vitamin D deficiency, unspecified: Secondary | ICD-10-CM

## 2013-11-14 DIAGNOSIS — E876 Hypokalemia: Secondary | ICD-10-CM

## 2013-11-14 MED ORDER — DILTIAZEM HCL 30 MG PO TABS: 30.0000 mg | ORAL_TABLET | Freq: Three times a day (TID) | ORAL | Status: DC | PRN

## 2013-11-14 NOTE — Assessment & Plan Note (Signed)
Blood pressure is well controlled on today's visit. No changes made to the medications. 

## 2013-11-14 NOTE — Patient Instructions (Addendum)
You are doing well.  Please take diltiazem 30 mg dose as needed for tachycardia  We will check potassium/BMP today  Please call us if you have new issues that need to be addressed before your next appt.  Your physician wants you to follow-up in: 6 months.  You will receive a reminder letter in the mail two months in advance. If you don't receive a letter, please call our office to schedule the follow-up appointment.

## 2013-11-14 NOTE — Assessment & Plan Note (Addendum)
Previously had low vitamin D. She may benefit from a supplement

## 2013-11-14 NOTE — Assessment & Plan Note (Signed)
Previous hypokinemia likely from HCTZ. We will avoid diuretics. Recheck her potassium today. This is essentially normal, we will hold her low dose potassium

## 2013-11-14 NOTE — Progress Notes (Signed)
Patient ID: Annette Romero, female    DOB: March 04, 1961, 53 y.o.   MRN: [id number]  HPI Comments: Annette Romero is a 53 year old woman with episodes of tachycardia starting back in 2002, at which time she was told that she had thyroid disease. She presents for routine followup  In followup today, she reports that she is doing very well. No significant symptoms of tachycardia. Possibly has short palpitations but nothing significant. She is tolerating diltiazem daily with metoprolol twice a day. She's not had followup with endocrine. No recent TSH apart from several months ago at which time it was normal.  Previously he had a  thyroid scan was told that she had thyroid "crisis". She was started on Tapazole 10 mg daily which improved her cardiac symptoms.   she stopped the Tapazole and symptoms  recurred over the past 1-1/2 years.  Symptoms since improved with calcium channel blocker and beta blocker Also is taking herbal supplements for her poor energy so she can stay awake at night. Symptoms improved without the supplements and on better rate control  HCTZ was held on last clinic visit, potassium cut in half to avoid hypokalemia  She's never had a Holter monitor or echocardiogram. EKG shows normal sinus rhythm with rate 62 beats a minute, no significant ST or T wave changes  Outpatient Encounter Prescriptions as of 11/14/2013  Medication Sig  . aspirin 81 MG tablet Take 81 mg by mouth daily.  Marland Kitchen. diltiazem (CARDIZEM CD) 120 MG 24 hr capsule Take 1 capsule (120 mg total) by mouth daily.  . metoprolol (LOPRESSOR) 50 MG tablet TAKE 1 TABLET (50 MG TOTAL) BY MOUTH 2 (TWO) TIMES DAILY.  Marland Kitchen. omeprazole (PRILOSEC OTC) 20 MG tablet Take 20 mg by mouth as needed.   . potassium chloride SA (K-DUR,KLOR-CON) 20 MEQ tablet Take 10 mEq by mouth every other day.  . traZODone (DESYREL) 100 MG tablet Take 1.5 tablets (150 mg total) by mouth at bedtime.    Review of Systems  Constitutional: Negative.   HENT: Negative.    Eyes: Negative.   Respiratory: Negative.   Cardiovascular: Positive for palpitations.  Gastrointestinal: Negative.   Endocrine: Negative.   Musculoskeletal: Negative.   Skin: Negative.   Allergic/Immunologic: Negative.   Neurological: Negative.   Hematological: Negative.   Psychiatric/Behavioral: Negative.   All other systems reviewed and are negative.   BP 110/72  Pulse 62  Ht 5\' 8"  (1.727 m)  Wt 241 lb 8 oz (109.544 kg)  BMI 36.73 kg/m2  Physical Exam  Nursing note and vitals reviewed. Constitutional: She is oriented to person, place, and time. She appears well-developed and well-nourished.  HENT:  Head: Normocephalic.  Nose: Nose normal.  Mouth/Throat: Oropharynx is clear and moist.  Eyes: Conjunctivae are normal. Pupils are equal, round, and reactive to light.  Neck: Normal range of motion. Neck supple. No JVD present.  Cardiovascular: Normal rate, regular rhythm, S1 normal, S2 normal, normal heart sounds and intact distal pulses.  Exam reveals no gallop and no friction rub.   No murmur heard. Pulmonary/Chest: Effort normal and breath sounds normal. No respiratory distress. She has no wheezes. She has no rales. She exhibits no tenderness.  Abdominal: Soft. Bowel sounds are normal. She exhibits no distension. There is no tenderness.  Musculoskeletal: Normal range of motion. She exhibits no edema and no tenderness.  Lymphadenopathy:    She has no cervical adenopathy.  Neurological: She is alert and oriented to person, place, and time. Coordination normal.  Skin: Skin  is warm and dry. No rash noted. No erythema.  Psychiatric: She has a normal mood and affect. Her behavior is normal. Judgment and thought content normal.    Assessment and Plan

## 2013-11-14 NOTE — Assessment & Plan Note (Signed)
Previously on Tapazole. Has been doing well without Tapazole on beta blockers. Prior TSH was normal range

## 2013-11-14 NOTE — Assessment & Plan Note (Signed)
Symptoms significantly improved on her current medication regimen. No changes made today. Diltiazem 30 mg pills offered also for when necessary use for breakthrough tachycardia

## 2013-11-15 LAB — BASIC METABOLIC PANEL
BUN/Creatinine Ratio: 11 (ref 9–23)
BUN: 9 mg/dL (ref 6–24)
CO2: 26 mmol/L (ref 18–29)
Calcium: 9.7 mg/dL (ref 8.7–10.2)
Chloride: 99 mmol/L (ref 97–108)
Creatinine, Ser: 0.84 mg/dL (ref 0.57–1.00)
GFR calc Af Amer: 90 mL/min/{1.73_m2} (ref >59)
GFR, EST NON AFRICAN AMERICAN: 78 mL/min/{1.73_m2} (ref >59)
Glucose: 98 mg/dL (ref 65–99)
POTASSIUM: 4.2 mmol/L (ref 3.5–5.2)
SODIUM: 141 mmol/L (ref 134–144)

## 2013-11-19 ENCOUNTER — Telehealth: Payer: Self-pay

## 2013-11-19 NOTE — Telephone Encounter (Signed)
Pt would like lab results. States she had an episode this morning where her "heart was racing". Please cll

## 2013-11-19 NOTE — Telephone Encounter (Signed)
Spoke w/ pt.  She reports that pt she had an episode this am, but it was very brief and is feeling better now.  Advised her of lab results.  Asked her to call back if episodes recur.

## 2013-12-09 ENCOUNTER — Other Ambulatory Visit: Payer: Self-pay | Admitting: Family Medicine

## 2014-03-10 ENCOUNTER — Other Ambulatory Visit: Payer: Self-pay | Admitting: Family Medicine

## 2014-03-18 ENCOUNTER — Telehealth: Payer: Self-pay | Admitting: Family Medicine

## 2014-03-18 ENCOUNTER — Other Ambulatory Visit: Payer: Self-pay | Admitting: Family Medicine

## 2014-03-18 NOTE — Telephone Encounter (Signed)
Ok to ref 3 months worth.  F/u cpx - please schedule

## 2014-03-18 NOTE — Telephone Encounter (Signed)
Last office visit 08/31/2013 with Dr. Ermalene SearingBedsole for CP.  Last CPE 01/01/2013.  Ok to refill?

## 2014-03-18 NOTE — Telephone Encounter (Signed)
Left message asking pt to call office please schedule below appointment   Please call and schedule CPE with fasting labs prior with Dr. Patsy Lageropland.   Thanks,  Lupita LeashDonna

## 2014-03-20 NOTE — Telephone Encounter (Signed)
Left message asking pt to call office  °

## 2014-03-22 NOTE — Telephone Encounter (Signed)
Left message asking pt to call office  °

## 2014-03-25 ENCOUNTER — Encounter: Payer: Self-pay | Admitting: Family Medicine

## 2014-03-25 NOTE — Telephone Encounter (Signed)
Mailed letter °

## 2014-04-05 ENCOUNTER — Other Ambulatory Visit: Payer: Self-pay | Admitting: Family Medicine

## 2014-05-04 ENCOUNTER — Other Ambulatory Visit: Payer: Self-pay | Admitting: Family Medicine

## 2014-05-05 NOTE — Telephone Encounter (Signed)
Last office visit 08/31/2013 with Dr. Ermalene SearingBedsole.  Last CPE 01/01/2013.  Last refilled 11/09/2013 for #45 with 2 refills. Ok to refill?

## 2014-05-06 NOTE — Telephone Encounter (Signed)
Ok to ref 45, 2 ref

## 2014-05-07 ENCOUNTER — Other Ambulatory Visit: Payer: Self-pay | Admitting: Family Medicine

## 2014-05-29 ENCOUNTER — Telehealth: Payer: Self-pay

## 2014-05-29 NOTE — Telephone Encounter (Signed)
Pt called states her BP was 145/101 this morning at 2 am.(she works 3rd shift, and was sent home due to her BP) She has some questions regarding her medications. States she also has headaches she thinks is coming from this. Please call. She has an appt on 2/23

## 2014-05-29 NOTE — Telephone Encounter (Signed)
Left message for pt to call back  °

## 2014-05-31 NOTE — Telephone Encounter (Signed)
Left message for pt to call back  °

## 2014-06-04 ENCOUNTER — Encounter: Payer: Self-pay | Admitting: Cardiovascular Disease

## 2014-06-04 ENCOUNTER — Ambulatory Visit (INDEPENDENT_AMBULATORY_CARE_PROVIDER_SITE_OTHER): Payer: BLUE CROSS/BLUE SHIELD | Admitting: Cardiovascular Disease

## 2014-06-04 VITALS — BP 120/78 | HR 77 | Ht 68.0 in | Wt 234.8 lb

## 2014-06-04 DIAGNOSIS — R002 Palpitations: Secondary | ICD-10-CM

## 2014-06-04 DIAGNOSIS — I159 Secondary hypertension, unspecified: Secondary | ICD-10-CM

## 2014-06-04 DIAGNOSIS — I471 Supraventricular tachycardia, unspecified: Secondary | ICD-10-CM

## 2014-06-04 DIAGNOSIS — E0590 Thyrotoxicosis, unspecified without thyrotoxic crisis or storm: Secondary | ICD-10-CM

## 2014-06-04 MED ORDER — POTASSIUM CHLORIDE 20 MEQ PO PACK: 20.0000 meq | PACK | ORAL | Status: DC

## 2014-06-04 MED ORDER — DILTIAZEM HCL 30 MG PO TABS: 30.0000 mg | ORAL_TABLET | Freq: Three times a day (TID) | ORAL | Status: DC | PRN

## 2014-06-04 MED ORDER — DILTIAZEM HCL ER COATED BEADS 120 MG PO CP24: 120.0000 mg | ORAL_CAPSULE | Freq: Every day | ORAL | Status: DC

## 2014-06-04 MED ORDER — METOPROLOL TARTRATE 50 MG PO TABS: ORAL_TABLET | ORAL | Status: DC

## 2014-06-04 NOTE — Assessment & Plan Note (Signed)
For the most part blood pressure well controlled. In the setting of headache blood pressure went up, likely situational from the headache pain. Suggested if headaches continue, that she talk with primary care. Consider taking NSAIDs as needed for possible tension headache

## 2014-06-04 NOTE — Progress Notes (Signed)
Patient ID: Annette Romero, female    DOB: [birth year], 54 y.o.   MRN: [id number]  HPI Comments: Annette Romero is a 54 year old woman with episodes of tachycardia starting back in 2002, at which time she was told that she had thyroid disease. She presents for routine followup of her tachycardia  In followup today, she reports that she is doing very well. No significant symptoms of tachycardia.   She is tolerating diltiazem daily with metoprolol twice a day. She's not had followup with endocrine. Recent episodes of headaches. Etiology of her headaches not clear, seems to come up the back of her neck.  Had headaches at work recently, 2 last week with high blood pressure. Typically blood pressure runs low She thinks she is had 4 bad headaches this year so far Continues to take some herbal supplements She takes one half potassium pill every other day  EKG done on today's visit  shows normal sinus rhythm with rate 77 bpm, no significant ST or T-wave changes  Other past medical history Previously he had a  thyroid scan was told that she had thyroid "crisis". She was started on Tapazole 10 mg daily which improved her cardiac symptoms.   she stopped the Tapazole and symptoms  recurred over the past 1-1/2 years.  Symptoms since improved with calcium channel blocker and beta blocker  She's never had a Holter monitor or echocardiogram.   Allergies  Allergen Reactions  . Zantac [Ranitidine Hcl] Rash    Outpatient Encounter Prescriptions as of 06/04/2014  Medication Sig  . aspirin 81 MG tablet Take 81 mg by mouth daily.  . Coenzyme Q10 (CO Q-10) 100 MG CAPS Take 100 mg by mouth daily.  Marland Kitchen. diltiazem (CARDIZEM CD) 120 MG 24 hr capsule Take 1 capsule (120 mg total) by mouth daily.  Marland Kitchen. diltiazem (CARDIZEM) 30 MG tablet Take 1 tablet (30 mg total) by mouth 3 (three) times daily as needed.  . metoprolol (LOPRESSOR) 50 MG tablet TAKE 1 TABLET TWICE A DAY  . Multiple Vitamins-Minerals (MULTIPLE VITAMINS/WOMENS  PO) Take by mouth daily.  . NON FORMULARY I-arginine three tablets twice a day.  Marland Kitchen. omeprazole (PRILOSEC OTC) 20 MG tablet Take 20 mg by mouth as needed.   . potassium chloride (KLOR-CON) 20 MEQ packet Take 20 mEq by mouth every other day.  . traZODone (DESYREL) 100 MG tablet TAKE 1 AND 1/2 TABLET BY MOUTH AT BEDTIME  . [DISCONTINUED] diltiazem (CARDIZEM CD) 120 MG 24 hr capsule Take 1 capsule (120 mg total) by mouth daily.  . [DISCONTINUED] diltiazem (CARDIZEM) 30 MG tablet Take 1 tablet (30 mg total) by mouth 3 (three) times daily as needed.  . [DISCONTINUED] KLOR-CON M20 20 MEQ tablet TAKE 1 TABLET (20 MEQ TOTAL) BY MOUTH DAILY. (NEED TO SCHEDULE ANNUAL PHYSICAL WITH DR COPLAND!)  . [DISCONTINUED] metoprolol (LOPRESSOR) 50 MG tablet TAKE 1 TABLET TWICE A DAY **MUST HAVE FOLLOW UP FOR FURTHER REFILLS**  . [DISCONTINUED] potassium chloride (KLOR-CON) 20 MEQ packet Take 20 mEq by mouth every other day.    Past Medical History  Diagnosis Date  . Hyperthyroidism   . Heart murmur   . Tachycardia     with increased thyroid level  . Vitamin D deficiency   . Hypertension   . GERD (gastroesophageal reflux disease)     Past Surgical History  Procedure Laterality Date  . Vaginal hysterectomy  2001    heavy bleeding due to fibroids, not cancer  . Cesarean section  1991  . Breast  biopsy  1974    breast cyst    Social History  reports that she has never smoked. She has never used smokeless tobacco. She reports that she does not drink alcohol or use illicit drugs.  Family History family history includes CVA in her father; Drug abuse in her brother; Heart attack in her father; Heart failure in her father.   Review of Systems  Constitutional: Negative.   Eyes: Negative.   Respiratory: Negative.   Cardiovascular: Negative.   Gastrointestinal: Negative.   Musculoskeletal: Negative.   Skin: Negative.   Allergic/Immunologic: Negative.   Neurological: Positive for headaches.   Hematological: Negative.   Psychiatric/Behavioral: Negative.   All other systems reviewed and are negative.   BP 120/78 mmHg  Pulse 77  Ht  (1.727 m)  Wt 234 lb 12 oz (106.482 kg)  BMI 35.70 kg/m2  Physical Exam  Constitutional: She is oriented to person, place, and time. She appears well-developed and well-nourished.  HENT:  Head: Normocephalic.  Nose: Nose normal.  Mouth/Throat: Oropharynx is clear and moist.  Eyes: Conjunctivae are normal. Pupils are equal, round, and reactive to light.  Neck: Normal range of motion. Neck supple. No JVD present.  Cardiovascular: Normal rate, regular rhythm, S1 normal, S2 normal, normal heart sounds and intact distal pulses.  Exam reveals no gallop and no friction rub.   No murmur heard. Pulmonary/Chest: Effort normal and breath sounds normal. No respiratory distress. She has no wheezes. She has no rales. She exhibits no tenderness.  Abdominal: Soft. Bowel sounds are normal. She exhibits no distension. There is no tenderness.  Musculoskeletal: Normal range of motion. She exhibits no edema or tenderness.  Lymphadenopathy:    She has no cervical adenopathy.  Neurological: She is alert and oriented to person, place, and time. Coordination normal.  Skin: Skin is warm and dry. No rash noted. No erythema.  Psychiatric: She has a normal mood and affect. Her behavior is normal. Judgment and thought content normal.    Assessment and Plan  Nursing note and vitals reviewed.

## 2014-06-04 NOTE — Assessment & Plan Note (Signed)
No symptoms of arrhythmia, will continue current medications

## 2014-06-04 NOTE — Assessment & Plan Note (Signed)
She denies any tachycardia concerning for hyperthyroidism. Overall she feels well. Recommended follow-up with primary care with routine lab testing

## 2014-06-04 NOTE — Patient Instructions (Addendum)
You are doing well. No medication changes were made.  Take naproxen as needed for headache  Please call us if you have new issues that need to be addressed before your next appt.  Your physician wants you to follow-up in: 6 months.  You will receive a reminder letter in the mail two months in advance. If you don't receive a letter, please call our office to schedule the follow-up appointment.

## 2014-06-11 ENCOUNTER — Other Ambulatory Visit: Payer: Self-pay | Admitting: Cardiovascular Disease

## 2014-07-21 ENCOUNTER — Other Ambulatory Visit: Payer: Self-pay | Admitting: Cardiovascular Disease

## 2014-08-10 ENCOUNTER — Other Ambulatory Visit: Payer: Self-pay | Admitting: Cardiovascular Disease

## 2014-10-07 ENCOUNTER — Telehealth: Payer: Self-pay | Admitting: *Deleted

## 2014-10-07 NOTE — Telephone Encounter (Signed)
LMOM to call back about mammo.

## 2014-10-17 ENCOUNTER — Other Ambulatory Visit: Payer: Self-pay | Admitting: Family Medicine

## 2014-10-17 NOTE — Telephone Encounter (Signed)
Electronic refill request. Not seen since 08/2013 and last filled  45 tablet 2 05/06/2014   Please advise

## 2014-10-17 NOTE — Telephone Encounter (Signed)
Ok to refill 45, 1 refill  F/u CPX

## 2014-10-17 NOTE — Telephone Encounter (Signed)
Please call and schedule CPE with fasting labs prior with Dr. Copland.  

## 2014-10-17 NOTE — Telephone Encounter (Signed)
Last office visit 08/31/2013 with Dr. Ermalene SearingBedsole.  No future appointment scheduled.  Last refilled 05/06/2014 for #45 with 2 refills.  Ok to refill?

## 2014-11-29 ENCOUNTER — Other Ambulatory Visit: Payer: Self-pay | Admitting: Family Medicine

## 2014-11-29 NOTE — Telephone Encounter (Signed)
F/u CPX schedule, ok to refill #90, 0 ref

## 2014-11-29 NOTE — Telephone Encounter (Signed)
Left message asking pt to call office  °

## 2014-11-29 NOTE — Telephone Encounter (Signed)
Last office visit 08/31/2013 with Dr. Ermalene Searing.  Last CPE 01/01/2013.   Refill?

## 2014-11-29 NOTE — Telephone Encounter (Signed)
Please call and schedule CPE with fasting labs prior with Dr. Patsy Lager.  Will need appointment to continue getting refills on her medications.

## 2014-12-24 ENCOUNTER — Other Ambulatory Visit: Payer: Self-pay | Admitting: Family Medicine

## 2014-12-24 NOTE — Telephone Encounter (Signed)
Last office visit 08/31/2013 with Dr. Ermalene Searing.  Last refilled 10/17/2014 for #45 with 1 refill with note needs office visit.  No future appointments scheduled.  Refill?

## 2015-01-20 ENCOUNTER — Other Ambulatory Visit: Payer: Self-pay | Admitting: Family Medicine

## 2015-01-20 DIAGNOSIS — I519 Heart disease, unspecified: Secondary | ICD-10-CM

## 2015-01-20 DIAGNOSIS — R5383 Other fatigue: Secondary | ICD-10-CM

## 2015-01-20 DIAGNOSIS — E039 Hypothyroidism, unspecified: Secondary | ICD-10-CM

## 2015-01-20 DIAGNOSIS — E559 Vitamin D deficiency, unspecified: Secondary | ICD-10-CM

## 2015-01-20 DIAGNOSIS — Z13220 Encounter for screening for lipoid disorders: Secondary | ICD-10-CM

## 2015-01-22 ENCOUNTER — Other Ambulatory Visit (INDEPENDENT_AMBULATORY_CARE_PROVIDER_SITE_OTHER): Payer: BLUE CROSS/BLUE SHIELD

## 2015-01-22 DIAGNOSIS — E039 Hypothyroidism, unspecified: Secondary | ICD-10-CM | POA: Diagnosis not present

## 2015-01-22 DIAGNOSIS — I519 Heart disease, unspecified: Secondary | ICD-10-CM

## 2015-01-22 DIAGNOSIS — Z13220 Encounter for screening for lipoid disorders: Secondary | ICD-10-CM | POA: Diagnosis not present

## 2015-01-22 DIAGNOSIS — R5383 Other fatigue: Secondary | ICD-10-CM

## 2015-01-22 DIAGNOSIS — E559 Vitamin D deficiency, unspecified: Secondary | ICD-10-CM

## 2015-01-22 LAB — HEPATIC FUNCTION PANEL
ALBUMIN: 4.2 g/dL (ref 3.5–5.2)
ALK PHOS: 106 U/L (ref 39–117)
ALT: 17 U/L (ref 0–35)
AST: 25 U/L (ref 0–37)
Bilirubin, Direct: 0.1 mg/dL (ref 0.0–0.3)
TOTAL PROTEIN: 8 g/dL (ref 6.0–8.3)
Total Bilirubin: 0.3 mg/dL (ref 0.2–1.2)

## 2015-01-22 LAB — CBC WITH DIFFERENTIAL/PLATELET
BASOS PCT: 0.4 % (ref 0.0–3.0)
Basophils Absolute: 0 10*3/uL (ref 0.0–0.1)
EOS ABS: 0.1 10*3/uL (ref 0.0–0.7)
Eosinophils Relative: 1.6 % (ref 0.0–5.0)
HCT: 39.5 % (ref 36.0–46.0)
HEMOGLOBIN: 12.8 g/dL (ref 12.0–15.0)
LYMPHS ABS: 3.2 10*3/uL (ref 0.7–4.0)
Lymphocytes Relative: 44.4 % (ref 12.0–46.0)
MCHC: 32.3 g/dL (ref 30.0–36.0)
MCV: 78.6 fl (ref 78.0–100.0)
MONO ABS: 0.6 10*3/uL (ref 0.1–1.0)
Monocytes Relative: 7.9 % (ref 3.0–12.0)
NEUTROS PCT: 45.7 % (ref 43.0–77.0)
Neutro Abs: 3.3 10*3/uL (ref 1.4–7.7)
Platelets: 282 10*3/uL (ref 150.0–400.0)
RBC: 5.03 Mil/uL (ref 3.87–5.11)
RDW: 14.8 % (ref 11.5–15.5)
WBC: 7.2 10*3/uL (ref 4.0–10.5)

## 2015-01-22 LAB — BASIC METABOLIC PANEL
BUN: 11 mg/dL (ref 6–23)
CO2: 29 meq/L (ref 19–32)
CREATININE: 0.73 mg/dL (ref 0.40–1.20)
Calcium: 9.6 mg/dL (ref 8.4–10.5)
Chloride: 102 mEq/L (ref 96–112)
GFR: 105.98 mL/min (ref >60.00)
Glucose, Bld: 86 mg/dL (ref 70–99)
Potassium: 3.7 mEq/L (ref 3.5–5.1)
Sodium: 139 mEq/L (ref 135–145)

## 2015-01-22 LAB — LIPID PANEL
CHOLESTEROL: 201 mg/dL — AB (ref 0–200)
HDL: 52.7 mg/dL (ref >39.00)
LDL Cholesterol: 137 mg/dL — ABNORMAL HIGH (ref 0–99)
NONHDL: 148.69
TRIGLYCERIDES: 57 mg/dL (ref 0.0–149.0)
Total CHOL/HDL Ratio: 4
VLDL: 11.4 mg/dL (ref 0.0–40.0)

## 2015-01-22 LAB — TSH: TSH: 0.91 u[IU]/mL (ref 0.35–4.50)

## 2015-01-22 LAB — T4, FREE: FREE T4: 0.82 ng/dL (ref 0.60–1.60)

## 2015-01-22 LAB — VITAMIN D 25 HYDROXY (VIT D DEFICIENCY, FRACTURES): VITD: 24.55 ng/mL — ABNORMAL LOW (ref 30.00–100.00)

## 2015-01-22 LAB — T3, FREE: T3 FREE: 3.8 pg/mL (ref 2.3–4.2)

## 2015-01-27 ENCOUNTER — Encounter: Payer: Self-pay | Admitting: Family Medicine

## 2015-01-27 ENCOUNTER — Ambulatory Visit (INDEPENDENT_AMBULATORY_CARE_PROVIDER_SITE_OTHER): Payer: BLUE CROSS/BLUE SHIELD | Admitting: Family Medicine

## 2015-01-27 VITALS — BP 124/80 | HR 90 | Temp 98.3°F | Ht 67.5 in | Wt 238.8 lb

## 2015-01-27 DIAGNOSIS — Z0000 Encounter for general adult medical examination without abnormal findings: Secondary | ICD-10-CM | POA: Diagnosis not present

## 2015-01-27 DIAGNOSIS — Z23 Encounter for immunization: Secondary | ICD-10-CM | POA: Diagnosis not present

## 2015-01-27 MED ORDER — TRAZODONE HCL 150 MG PO TABS: 150.0000 mg | ORAL_TABLET | Freq: Every evening | ORAL | Status: DC | PRN

## 2015-01-27 NOTE — Progress Notes (Signed)
Dr. Frederico Hamman T. Jenai Scaletta, MD, [facility] [place], [address] Phone: [phone] Fax: [phone]  01/27/2015  Patient: Annette Romero, MRN: [id number], DOB: Sep 18, 1960, 54 y.o.  Primary Physician:  Owens Loffler, MD   No chief complaint on file.  Subjective:   Annette Romero is a 54 y.o. pleasant patient who presents with the following:  Health Maintenance Summary Reviewed and updated, unless pt declines services.  Tobacco History Reviewed. Non-smoker Alcohol: No concerns, no excessive use Exercise Habits: Some activity, rec at least 30 mins 5 times a week STD concerns: none Drug Use: None Lumps or breast concerns: no Breast Cancer Family History: no  Colon - in [place]. Declines -  Tdap - get booster Mammo Flu - gets at work  Questions about her hyperthyroidism. Has seen Dr. Carlis Abbott and Dr. Chalmers Cater.  Unsure how to answer her questions.  Questions about Tapazole, Vit D.  Health Maintenance  Topic Date Due  . Hepatitis C Screening  February 13, 1961  . HIV Screening  11/07/1973  . MAMMOGRAM  07/07/2014  . COLONOSCOPY  01/28/2016 (Originally 11/07/2008)  . INFLUENZA VACCINE  11/11/2015  . TETANUS/TDAP  01/26/2025    Immunization History  Administered Date(s) Administered  . Influenza,inj,Quad PF,36+ Mos 01/27/2015  . Tdap 01/27/2015   Patient Active Problem List   Diagnosis Date Noted  . Paroxysmal SVT (supraventricular tachycardia) ([place]) 10/11/2013  . Hyperthyroidism   . Heart murmur   . Vitamin D deficiency   . Hypertension   . GERD (gastroesophageal reflux disease)    Past Medical History  Diagnosis Date  . Hyperthyroidism   . Heart murmur   . Tachycardia     with increased thyroid level  . Vitamin D deficiency   . Hypertension   . GERD (gastroesophageal reflux disease)    Past Surgical History  Procedure Laterality Date  . Vaginal hysterectomy  2001    heavy bleeding due to fibroids, not  cancer  . Cesarean section  1991  . Breast biopsy  1974    breast cyst   Social History   Social History  . Marital Status: Married    Spouse Name: N/A  . Number of Children: 2  . Years of Education: N/A   Occupational History  . nurse     LPN at [employer]  . Smoking status: Never Smoker   . Smokeless tobacco: Never Used  . Alcohol Use: No  . Drug Use: No  . Sexual Activity: Not on file   Other Topics Concern  . Not on file   Social History Narrative   Nurse at [employer]   Separated   2 children   Son, 20, lives in [place], 45, [place]. [place]   Family History  Problem Relation Age of Onset  . CVA Father   . Heart attack Father   . Heart failure Father   . Drug abuse Brother     d/c from drugs   Allergies  Allergen Reactions  . Zantac [Ranitidine Hcl] Rash    Medication list has been reviewed and updated.   General: Denies fever, chills, sweats. No significant weight loss. Eyes: Denies blurring,significant itching ENT: Denies earache, sore throat, and hoarseness.  Cardiovascular: Denies chest pains, palpitations, dyspnea on exertion,  Respiratory: Denies cough, dyspnea at rest,wheeezing Breast: no concerns about lumps GI: Denies nausea, vomiting, diarrhea, constipation, change in bowel habits, abdominal pain, melena, hematochezia GU:  Denies dysuria, hematuria, urinary hesitancy, nocturia, denies STD risk, no concerns about discharge Musculoskeletal: Denies back pain, joint pain Derm: Denies rash, itching Neuro: Denies  paresthesias, frequent falls, frequent headaches Psych: Denies depression, anxiety Endocrine: Denies cold intolerance, heat intolerance, polydipsia Heme: Denies enlarged lymph nodes Allergy: No hayfever  Objective:   BP 124/80 mmHg  Pulse 90  Temp(Src) 98.3 F (36.8 C) (Oral)  Ht 5' 7.5" (1.715 m)  Wt 238 lb 12 oz (108.296 kg)  BMI 36.82 kg/m2 No exam data present  GEN: well  developed, well nourished, no acute distress Eyes: conjunctiva and lids normal, PERRLA, EOMI ENT: TM clear, nares clear, oral exam WNL Neck: supple, no lymphadenopathy, no thyromegaly, no JVD Pulm: clear to auscultation and percussion, respiratory effort normal CV: regular rate and rhythm, S1-S2, no murmur, rub or gallop, no bruits Chest: no scars, masses, no lumps BREAST: breast exam declined GI: soft, non-tender; no hepatosplenomegaly, masses; active bowel sounds all quadrants GU: GU exam declined Lymph: no cervical, axillary or inguinal adenopathy MSK: gait normal, muscle tone and strength WNL, no joint swelling, effusions, discoloration, crepitus  SKIN: clear, good turgor, color WNL, no rashes, lesions, or ulcerations Neuro: normal mental status, normal strength, sensation, and motion Psych: alert; oriented to person, place and time, normally interactive and not anxious or depressed in appearance.   All labs reviewed with patient. Lipids:    Component Value Date/Time   CHOL 201* 01/22/2015 0921   TRIG 57.0 01/22/2015 0921   HDL 52.70 01/22/2015 0921   LDLDIRECT 142.2 01/01/2013 1113   VLDL 11.4 01/22/2015 0921   CHOLHDL 4 01/22/2015 0921   CBC: CBC Latest Ref Rng 01/22/2015 01/01/2013  WBC 4.0 - 10.5 K/uL 7.2 7.1  Hemoglobin 12.0 - 15.0 g/dL 12.8 12.8  Hematocrit 36.0 - 46.0 % 39.5 38.7  Platelets 150.0 - 400.0 K/uL 282.0 465.0    Basic Metabolic Panel:    Component Value Date/Time   NA 139 01/22/2015 0921   NA 141 11/14/2013 1606   K 3.7 01/22/2015 0921   CL 102 01/22/2015 0921   CO2 29 01/22/2015 0921   BUN 11 01/22/2015 0921   BUN 9 11/14/2013 1606   CREATININE 0.73 01/22/2015 0921   GLUCOSE 86 01/22/2015 0921   GLUCOSE 98 11/14/2013 1606   CALCIUM 9.6 01/22/2015 0921   Hepatic Function Latest Ref Rng 01/22/2015 01/01/2013  Total Protein 6.0 - 8.3 g/dL 8.0 7.5  Albumin 3.5 - 5.2 g/dL 4.2 3.8  AST 0 - 37 U/L 25 21  ALT 0 - 35 U/L 17 18  Alk Phosphatase 39 -  117 U/L 106 79  Total Bilirubin 0.2 - 1.2 mg/dL 0.3 0.5  Bilirubin, Direct 0.0 - 0.3 mg/dL 0.1 0.0    Lab Results  Component Value Date   TSH 0.91 01/22/2015   No results found.  Assessment and Plan:   Healthcare maintenance  Need for prophylactic vaccination with combined diphtheria-tetanus-pertussis (DTP) vaccine - Plan: Tdap vaccine greater than or equal to 7yo IM  Need for prophylactic vaccination and inoculation against influenza - Plan: Flu Vaccine QUAD 36+ mos IM   Health Maintenance Exam: The patient's preventative maintenance and recommended screening tests for an annual wellness exam were reviewed in full today. Brought up to date unless services declined.  Counselled on the importance of diet, exercise, and its role in overall health and mortality. The patient's FH and SH was reviewed, including their home life, tobacco status, and drug and alcohol status.  Trial discontinuing the  patient's other medications including metoprolol and some of her supplements.  Discontinue hydrochlorothiazide as well.  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  She is to call me if it is above 140/90.  Follow-up: No Follow-up on file. Or follow-up in 1 year for complete physical examination  New Prescriptions   No medications on file   Modified Medications   Modified Medication Previous Medication   TRAZODONE (DESYREL) 150 MG TABLET traZODone (DESYREL) 100 MG tablet      Take 1 tablet (150 mg total) by mouth at bedtime as needed for sleep.    TAKE 1 AND 1/2 TABLET BY MOUTH AT BEDTIME   Orders Placed This Encounter  Procedures  . Tdap vaccine greater than or equal to 7yo IM  . Flu Vaccine QUAD 36+ mos IM    Signed,  Annette Beadles T. Elsye Mccollister, MD   Patient's Medications  New Prescriptions   No medications on file  Previous Medications   DILTIAZEM (CARDIZEM CD) 120 MG 24 HR CAPSULE    TAKE 1 CAPSULE (120 MG TOTAL) BY MOUTH DAILY.   DILTIAZEM (CARDIZEM) 30 MG  TABLET    Take 1 tablet (30 mg total) by mouth 3 (three) times daily as needed.   MULTIPLE VITAMINS-MINERALS (MULTIPLE VITAMINS/WOMENS PO)    Take by mouth daily.  Modified Medications   Modified Medication Previous Medication   TRAZODONE (DESYREL) 150 MG TABLET traZODone (DESYREL) 100 MG tablet      Take 1 tablet (150 mg total) by mouth at bedtime as needed for sleep.    TAKE 1 AND 1/2 TABLET BY MOUTH AT BEDTIME  Discontinued Medications   ASPIRIN 81 MG TABLET    Take 81 mg by mouth daily.   COENZYME Q10 (CO Q-10) 100 MG CAPS    Take 100 mg by mouth daily.   DILTIAZEM (CARDIZEM CD) 120 MG 24 HR CAPSULE    Take 1 capsule (120 mg total) by mouth daily.   HYDROCHLOROTHIAZIDE (MICROZIDE) 12.5 MG CAPSULE    TAKE 1 CAPSULE (12.5 MG TOTAL) BY MOUTH DAILY.   KLOR-CON M20 20 MEQ TABLET    Take only when taking HCTZ   METOPROLOL (LOPRESSOR) 50 MG TABLET    TAKE 1 TABLET BY MOUTH TWICE A DAY   METOPROLOL (LOPRESSOR) 50 MG TABLET    TAKE 1 TABLET BY MOUTH TWICE A DAY   NON FORMULARY    I-arginine three tablets twice a day.   OMEPRAZOLE (PRILOSEC OTC) 20 MG TABLET    Take 20 mg by mouth as needed.    POTASSIUM CHLORIDE (KLOR-CON) 20 MEQ PACKET    Take 20 mEq by mouth every other day.

## 2015-01-27 NOTE — Patient Instructions (Addendum)
Vitamin D, 2000 units, once a day (Over the counter)  You do not need a referral to make a mammogram appointment, and you may call to make her own mammogram appointment directly around your schedule.  [facility]:  [facility] [phone] [address] [place], [place] [address]  [facility] ([facility]) [address] [place], [place] [address] Phone: [phone] Toll Free: [phone]  MAMMOGRAPHY IN [place]:  [facility]([place]) [phone] Located on the campus of [facility]([place])  [facility] [place] [facility]) [address]  [place], [place] [address]

## 2015-01-27 NOTE — Progress Notes (Signed)
Pre visit review using our clinic review tool, if applicable. No additional management support is needed unless otherwise documented below in the visit note. 

## 2015-01-28 ENCOUNTER — Encounter: Payer: Self-pay | Admitting: Family Medicine

## 2015-02-14 ENCOUNTER — Telehealth: Payer: Self-pay | Admitting: Cardiovascular Disease

## 2015-02-14 NOTE — Telephone Encounter (Signed)
3 attempts to schedule OD fu from recall.   LMOV to call office for scheduling.     Deleting recall.

## 2015-02-21 ENCOUNTER — Other Ambulatory Visit: Payer: Self-pay | Admitting: Cardiovascular Disease

## 2015-02-21 NOTE — Telephone Encounter (Signed)
Spoke with patient and she stated that she felt like she was doing ok off of this medication. For the last few days she has been having bad headaches and she started taking the metoprolol again and the headaches have subsided. Ok to refill? Please advise. Thanks, MI

## 2015-02-25 ENCOUNTER — Telehealth: Payer: Self-pay

## 2015-02-25 MED ORDER — LOSARTAN POTASSIUM 50 MG PO TABS: 50.0000 mg | ORAL_TABLET | Freq: Two times a day (BID) | ORAL | Status: DC

## 2015-02-25 NOTE — Telephone Encounter (Signed)
Elevated BP's are noted.   I would restart  Lopressor 50 mg, 1 po bid  Electronically prescribe to the pharmacy of their choice. (May call in if pharmacy does not participate in electronic prescriptions) Call in #30, 11 refills. OR if they prefer a 90 day supply, #90 with 3 refills is OK, too Prescription instructions above

## 2015-02-25 NOTE — Telephone Encounter (Signed)
Lurena JoinerRebecca notified as instructed by telephone.  Prescription for Lopressor 50 mg [facility] [place] as instructed by Dr. Patsy Lageropland.

## 2015-02-25 NOTE — Telephone Encounter (Signed)
Pt left note requesting refill lopressor 50 mg; cardiologist told pt she doesn't have elevated BP.pt notes she has been on lopressor and HCTZ for over 10 years. Pt thinks was told last month when saw Dr Patsy Lageropland to monitor BP and take as needed.pt having h/as and elevated BP;BP was 142/94 last night; at 1 AM BP 137/84 and 9 AM this morning BP 153/93 and pt has h/a. Per 01/27/15 annual exam noted Dr Patsy Lageropland advised pt to D/C metoprolol and if BP > 140/90 pt to cb.Please advise. [facility] [place]. Pt request cb.

## 2015-02-26 MED ORDER — METOPROLOL TARTRATE 50 MG PO TABS: 50.0000 mg | ORAL_TABLET | Freq: Two times a day (BID) | ORAL | Status: DC

## 2015-02-26 NOTE — Telephone Encounter (Addendum)
Patient came by office this morning stating she picked up her prescription that we sent in yesterday and she had never taking losartan and her insurance will not pay for two times a day dosing.  She has not taken any of the Losartan.  After reviewing phone note I realized I sent in the wrong medication.  Advised patient to take the Losartan prescription  back to pharmacy and I will send in correct medication.  [facility] notified.

## 2015-02-26 NOTE — Addendum Note (Signed)
Addended by: Damita LackLORING, Carmelina Balducci S on: 02/26/2015 08:31 AM   Modules accepted: Orders

## 2015-02-26 NOTE — Addendum Note (Signed)
Addended by: Damita LackLORING, Nikelle Malatesta S on: 02/26/2015 08:47 AM   Modules accepted: Orders, Medications

## 2015-02-26 NOTE — Telephone Encounter (Signed)
Noted  

## 2015-03-18 ENCOUNTER — Ambulatory Visit (INDEPENDENT_AMBULATORY_CARE_PROVIDER_SITE_OTHER): Payer: BLUE CROSS/BLUE SHIELD | Admitting: Family Medicine

## 2015-03-18 ENCOUNTER — Encounter: Payer: Self-pay | Admitting: Family Medicine

## 2015-03-18 VITALS — BP 144/89 | HR 79 | Temp 97.9°F | Wt 243.2 lb

## 2015-03-18 DIAGNOSIS — I10 Essential (primary) hypertension: Secondary | ICD-10-CM | POA: Diagnosis not present

## 2015-03-18 NOTE — Assessment & Plan Note (Signed)
Deteriorated. >25 minutes spent in face to face time with patient, >50% spent in counselling or coordination of care  In talking with pt, complicated history- paroxysmal SVT along with hyperthyroidism.  Unclear what is contributing at this point although her heart rate is controlled. Advised to take HCTZ 12.5 mg daily along with Kdur daily and to follow up with Dr. Patsy Lageropland in 2 weeks for BP follow up and labs. Continue to check BP at work and to keep us updated. The patient indicates understanding of these issues and agrees with the plan.

## 2015-03-18 NOTE — Patient Instructions (Signed)
Great to meet you. Let's start taking HCTZ 12.5 mg daily along with your potassium 10 mEq daily. Follow up with Dr. Patsy Lageropland in 2 weeks.

## 2015-03-18 NOTE — Progress Notes (Signed)
Pre visit review using our clinic review tool, if applicable. No additional management support is needed unless otherwise documented below in the visit note. 

## 2015-03-18 NOTE — Progress Notes (Signed)
Subjective:   Patient ID: Annette Romero, female    DOB: 1960-12-25, 54 y.o.   MRN: [id number]  Annette Romero is a pleasant 54 y.o. year old female pt of Dr. Patsy Lager, new to me, who presents to clinic today with Hypertension  on 03/18/2015  HPI:  HTN- followed by cardiology as well. Currently taking Cardizem CD 120 mg daily, Lopressor 50 mg daily, and HCTZ 12.5 mg as needed for edema. Also takes KDur 10 meq when she takes HCTZ.   Over past several weeks, worsening headaches.   She is an Charity fundraiser- checking it at work- BP has been ranging 140s-150s/90s- 100s.  Headaches are persisting. Heart is no longer racing.  Also has hyperthyroidism which has been contributing to tachycardia, HTN- notes reviewed.  Lab Results  Component Value Date   TSH 0.91 01/22/2015   Lab Results  Component Value Date   NA 139 01/22/2015   K 3.7 01/22/2015   CL 102 01/22/2015   CO2 29 01/22/2015      Lab Results  Component Value Date   CREATININE 0.73 01/22/2015   BP Readings from Last 3 Encounters:  03/18/15 144/89  01/27/15 124/80  06/04/14 120/78   Current Outpatient Prescriptions on File Prior to Visit  Medication Sig Dispense Refill  . diltiazem (CARDIZEM CD) 120 MG 24 hr capsule TAKE 1 CAPSULE (120 MG TOTAL) BY MOUTH DAILY. 30 capsule 6  . metoprolol (LOPRESSOR) 50 MG tablet Take 1 tablet (50 mg total) by mouth 2 (two) times daily. 180 tablet 1  . Multiple Vitamins-Minerals (MULTIPLE VITAMINS/WOMENS PO) Take by mouth daily.    . traZODone (DESYREL) 150 MG tablet Take 1 tablet (150 mg total) by mouth at bedtime as needed for sleep. 30 tablet 5   No current facility-administered medications on file prior to visit.    Allergies  Allergen Reactions  . Zantac [Ranitidine Hcl] Rash    Past Medical History  Diagnosis Date  . Hyperthyroidism   . Heart murmur   . Tachycardia     with increased thyroid level  . Vitamin D deficiency   . Hypertension   . GERD (gastroesophageal reflux disease)      Past Surgical History  Procedure Laterality Date  . Vaginal hysterectomy  2001    heavy bleeding due to fibroids, not cancer  . Cesarean section  1991  . Breast biopsy  1974    breast cyst    Family History  Problem Relation Age of Onset  . CVA Father   . Heart attack Father   . Heart failure Father   . Drug abuse Brother     d/c from drugs    Social History   Social History  . Marital Status: Married    Spouse Name: N/A  . Number of Children: 2  . Years of Education: N/A   Occupational History  . nurse     LPN at [employer]   Social History Main Topics  . Smoking status: Never Smoker   . Smokeless tobacco: Never Used  . Alcohol Use: No  . Drug Use: No  . Sexual Activity: Not on file   Other Topics Concern  . Not on file   Social History Narrative   Nurse at [employer]   Separated   2 children   Son, 40, lives in [place], [address], [place]. [place]   The PMH, PSH, Social History, Family History, Medications, and allergies have been reviewed in [facility], and have been updated if  relevant.   Review of Systems  Constitutional: Positive for fatigue.  Eyes: Negative.   Respiratory: Negative.   Cardiovascular: Negative for palpitations.  Gastrointestinal: Negative.   Neurological: Positive for headaches. Negative for dizziness, tremors, seizures, syncope, facial asymmetry, speech difficulty, weakness, light-headedness and numbness.  All other systems reviewed and are negative.      Objective:    BP 144/89 mmHg  Pulse 79  Temp(Src) 97.9 F (36.6 C) (Oral)  Wt 243 lb 4 oz (110.337 kg)  SpO2 99%   Physical Exam  Constitutional: She is oriented to person, place, and time. She appears well-developed and well-nourished. No distress.  HENT:  Head: Normocephalic.  Eyes: Conjunctivae are normal.  Neck: Normal range of motion.  Cardiovascular: Normal rate and regular rhythm.   Pulmonary/Chest: Effort normal and breath sounds normal.    Musculoskeletal: Normal range of motion. She exhibits no edema.  Neurological: She is alert and oriented to person, place, and time. No cranial nerve deficit.  Skin: Skin is warm and dry. She is not diaphoretic.  Psychiatric: She has a normal mood and affect. Her behavior is normal. Judgment and thought content normal.  Nursing note and vitals reviewed.         Assessment & Plan:   Essential hypertension No Follow-up on file.

## 2015-04-02 ENCOUNTER — Ambulatory Visit: Payer: BLUE CROSS/BLUE SHIELD | Admitting: Family Medicine

## 2015-04-03 ENCOUNTER — Telehealth: Payer: Self-pay | Admitting: Family Medicine

## 2015-04-03 NOTE — Telephone Encounter (Signed)
Please have her follow her BP at work / home. (she is a Engineer, civil (consulting)nurse).  If above 140/90 consistently, she should follow-up with me again.

## 2015-04-03 NOTE — Telephone Encounter (Signed)
Pt did not come in for their appt on 04/02/15 for follow up. Please let me know if pt needs to be contacted immediately for follow up or no follow up needed. Best phone number to contact pt is [phone].

## 2015-04-03 NOTE — Telephone Encounter (Signed)
Called and left a voicemail for pt to call us back to relay message below from Dr. Patsy Lageropland.

## 2015-06-16 ENCOUNTER — Other Ambulatory Visit: Payer: Self-pay | Admitting: Family Medicine

## 2015-06-18 ENCOUNTER — Ambulatory Visit (INDEPENDENT_AMBULATORY_CARE_PROVIDER_SITE_OTHER): Payer: BLUE CROSS/BLUE SHIELD | Admitting: Family Medicine

## 2015-06-18 ENCOUNTER — Encounter: Payer: Self-pay | Admitting: Family Medicine

## 2015-06-18 VITALS — BP 115/76 | HR 76 | Temp 97.6°F | Ht 67.5 in | Wt 238.8 lb

## 2015-06-18 DIAGNOSIS — R51 Headache: Secondary | ICD-10-CM

## 2015-06-18 DIAGNOSIS — I10 Essential (primary) hypertension: Secondary | ICD-10-CM

## 2015-06-18 DIAGNOSIS — R519 Headache, unspecified: Secondary | ICD-10-CM

## 2015-06-18 MED ORDER — METOPROLOL TARTRATE 50 MG PO TABS: 50.0000 mg | ORAL_TABLET | Freq: Two times a day (BID) | ORAL | Status: DC

## 2015-06-18 MED ORDER — HYDROCHLOROTHIAZIDE 12.5 MG PO CAPS: 12.5000 mg | ORAL_CAPSULE | Freq: Every day | ORAL | Status: DC

## 2015-06-18 MED ORDER — DILTIAZEM HCL ER COATED BEADS 120 MG PO CP24: ORAL_CAPSULE | ORAL | Status: DC

## 2015-06-18 NOTE — Progress Notes (Signed)
Pre visit review using our clinic review tool, if applicable. No additional management support is needed unless otherwise documented below in the visit note. 

## 2015-06-18 NOTE — Progress Notes (Signed)
Dr. Karleen HampshireSpencer T. Yulisa Chirico, MD, CAQ Sports Medicine Primary Care and Sports Medicine [address] [place], [address] Phone: [phone] Fax: [phone]  06/18/2015  Patient: Annette CellaRebecca Romero, MRN: [id number], DOB: Aug 15, 1960, 55 y.o.  Primary Physician:  Hannah BeatSpencer Carlethia Mesquita, MD   Chief Complaint  Patient presents with  . Follow-up    Blood Pressure  . Migraine   Subjective:   Annette Romero is a 55 y.o. very pleasant female patient who presents with the following:  BP 148/102.   Taking potassium and lopressor.   Headache, ? Migraine  The patient ran out of her blood pressure medication, specifically her hydrochlorothiazide, and she is also out of her Cardizem extended release.   This is only been for a few days, but in that time she developed a significant headache.  She has no history of significant headache or migraine, but now she does have some mild photophobia which is the first time this is ever occurred in her life along with a severe headache.  She has taken some caffeine, but this is the extent of what she has tried thus far.  Past Medical History, Surgical History, Social History, Family History, Problem List, Medications, and Allergies have been reviewed and updated if relevant.  Patient Active Problem List   Diagnosis Date Noted  . Paroxysmal SVT (supraventricular tachycardia) (HCC) 10/11/2013  . Hyperthyroidism   . Heart murmur   . Vitamin D deficiency   . Hypertension   . GERD (gastroesophageal reflux disease)     Past Medical History  Diagnosis Date  . Hyperthyroidism   . Heart murmur   . Tachycardia     with increased thyroid level  . Vitamin D deficiency   . Hypertension   . GERD (gastroesophageal reflux disease)     Past Surgical History  Procedure Laterality Date  . Vaginal hysterectomy  2001    heavy bleeding due to fibroids, not cancer  . Cesarean section  1991  . Breast biopsy  1974    breast cyst    Social History   Social History    . Marital Status: Married    Spouse Name: N/A  . Number of Children: 2  . Years of Education: N/A   Occupational History  . 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 Smoking status: Never Smoker   . Smokeless tobacco: Never Used  . Alcohol Use: No  . Drug Use: No  . Sexual Activity: Not on file   Other Topics Concern  . Not on file   Social History Narrative   Nurse at [employer]   Separated   2 children   Son, 1433, lives in [place]   Daughter, [address], [place]    Family History  Problem Relation Age of Onset  . CVA Father   . Heart attack Father   . Heart failure Father   . Drug abuse Brother     d/c from drugs    Allergies  Allergen Reactions  . Zantac [Ranitidine Hcl] Rash    Medication list reviewed and updated in full in [facility].   GEN: No acute illnesses, no fevers, chills. GI: No n/v/d, eating normally Pulm: No SOB Interactive and getting along well at home.  Otherwise, ROS is as per the HPI.  Objective:   BP 115/76 mmHg  Pulse 76  Temp(Src) 97.6 F (36.4 C) (Oral)  Ht 5' 7.5" (1.715 m)  Wt 238 lb  12 oz (108.296 kg)  BMI 36.82 kg/m2  GEN: WDWN, NAD, Non-toxic, A & O x 3 HEENT: Atraumatic, Normocephalic. Neck supple. No masses, No LAD. Ears and Nose: No external deformity. CV: RRR, No M/G/R. No JVD. No thrill. No extra heart sounds. PULM: CTA B, no wheezes, crackles, rhonchi. No retractions. No resp. distress. No accessory muscle use. EXTR: No c/c/e NEURO Normal gait.  PSYCH: Normally interactive. Conversant. Not depressed or anxious appearing.  Calm demeanor.   Laboratory and Imaging Data:  Assessment and Plan:   Essential hypertension  Acute nonintractable headache, unspecified headache type  I'm suspicious that her headache is primarily driven from sudden discontinuation of her hypertensive medications.  We will resume these.  She has tolerated these for a long time.  Recommended for headache to  do 1 g of Tylenol along with ibuprofen 800 mg.  She can go to sleep now having just finished her shift.  Follow-up: for CPX this year  New Prescriptions   No medications on file   Modified Medications   Modified Medication Previous Medication   DILTIAZEM (CARDIZEM CD) 120 MG 24 HR CAPSULE diltiazem (CARDIZEM CD) 120 MG 24 hr capsule      TAKE 1 CAPSULE (120 MG TOTAL) BY MOUTH DAILY.    TAKE 1 CAPSULE (120 MG TOTAL) BY MOUTH DAILY.   HYDROCHLOROTHIAZIDE (MICROZIDE) 12.5 MG CAPSULE hydrochlorothiazide (MICROZIDE) 12.5 MG capsule      Take 1 capsule (12.5 mg total) by mouth daily.    TAKE 1 CAPSULE (12.5 MG TOTAL) BY MOUTH DAILY.   METOPROLOL (LOPRESSOR) 50 MG TABLET metoprolol (LOPRESSOR) 50 MG tablet      Take 1 tablet (50 mg total) by mouth 2 (two) times daily.    Take 1 tablet (50 mg total) by mouth 2 (two) times daily.   No orders of the defined types were placed in this encounter.    Signed,  Elpidio Galea. Dariona Postma, MD   Patient's Medications  New Prescriptions   No medications on file  Previous Medications   MULTIPLE VITAMINS-MINERALS (MULTIPLE VITAMINS/WOMENS PO)    Take by mouth daily.   POTASSIUM CHLORIDE (K-DUR) 10 MEQ TABLET    Take 10 mEq by mouth daily.   TRAZODONE (DESYREL) 150 MG TABLET    Take 1 tablet (150 mg total) by mouth at bedtime as needed for sleep.  Modified Medications   Modified Medication Previous Medication   DILTIAZEM (CARDIZEM CD) 120 MG 24 HR CAPSULE diltiazem (CARDIZEM CD) 120 MG 24 hr capsule      TAKE 1 CAPSULE (120 MG TOTAL) BY MOUTH DAILY.    TAKE 1 CAPSULE (120 MG TOTAL) BY MOUTH DAILY.   HYDROCHLOROTHIAZIDE (MICROZIDE) 12.5 MG CAPSULE hydrochlorothiazide (MICROZIDE) 12.5 MG capsule      Take 1 capsule (12.5 mg total) by mouth daily.    TAKE 1 CAPSULE (12.5 MG TOTAL) BY MOUTH DAILY.   METOPROLOL (LOPRESSOR) 50 MG TABLET metoprolol (LOPRESSOR) 50 MG tablet      Take 1 tablet (50 mg total) by mouth 2 (two) times daily.    Take 1 tablet (50 mg total)  by mouth 2 (two) times daily.  Discontinued Medications   No medications on file

## 2015-07-04 ENCOUNTER — Other Ambulatory Visit: Payer: Self-pay | Admitting: Cardiovascular Disease

## 2015-07-04 ENCOUNTER — Telehealth: Payer: Self-pay

## 2015-07-04 NOTE — Telephone Encounter (Signed)
-----   Message from Charlynn GrimesHiraa M Stroud sent at 07/04/2015 12:43 PM EDT ----- Called patient, she said she is now with another provider  ----- Message -----    From: Festus AloeSharon G Laiyah Exline, CMA    Sent: 07/04/2015   9:37 AM      To: Charlynn GrimesHiraa M Stroud  Patient needs a follow up appointment with Dr. Mariah MillingGollan!  I am sending in one refill until she gets her appt.  Thanks again, Bishop DublinSharon Kalyb Pemble

## 2015-07-04 NOTE — Telephone Encounter (Signed)
See message below °

## 2015-10-09 ENCOUNTER — Other Ambulatory Visit: Payer: Self-pay | Admitting: Family Medicine

## 2015-10-09 NOTE — Telephone Encounter (Signed)
Last office visit 06/18/2015.  Last refilled 01/27/2015 for #30 with 5 refills.  Ok to refill?

## 2015-10-30 ENCOUNTER — Other Ambulatory Visit: Payer: Self-pay | Admitting: Cardiovascular Disease

## 2015-11-07 ENCOUNTER — Other Ambulatory Visit: Payer: Self-pay

## 2015-11-07 NOTE — Telephone Encounter (Signed)
Refuse prescription refill... Needs appt to be seen by PCP or Dr. Mariah Milling first for labs as well as appt.

## 2015-11-07 NOTE — Telephone Encounter (Signed)
Pt left v/m requesting refill K to  [facility] [place]. Last seen 06/18/15. Last refilled # 15 x 6 by Dr Mariah Milling on 06/04/14. Dr Mariah Milling denied refill request. Please advise.

## 2015-11-11 NOTE — Telephone Encounter (Signed)
I see where Dr. Dayton Martes recommended 10 mEq Kdur in 03/2016, but did not have BMET checked afterward. I think this pt needs to at least have labs to make sure potassium not to high on this supplement. Have her schedule BMET. If she has been taking Kdur dialy and potassium is in nml range, I will refill without and appointment.

## 2015-11-11 NOTE — Telephone Encounter (Signed)
Left message for Annette Romero, that we are unable to refill her K-Dur until she either schedules an office visit or at least comes in for lab work.  Needs to have a BMET drawn to make sure her potasium is in normal range before refills can be given per Dr. Ermalene Searing.

## 2015-11-11 NOTE — Telephone Encounter (Signed)
See office note from Dr. Dayton Martes on 03/18/2015.  She instructed patient to take K-Dur daily along with HCTZ.  Should we refill?

## 2015-11-21 ENCOUNTER — Other Ambulatory Visit: Payer: Self-pay | Admitting: Cardiovascular Disease

## 2015-11-26 ENCOUNTER — Other Ambulatory Visit: Payer: Self-pay | Admitting: Family Medicine

## 2015-12-09 ENCOUNTER — Telehealth: Payer: Self-pay | Admitting: Family Medicine

## 2015-12-09 NOTE — Telephone Encounter (Signed)
Left message for Ms. Brister that her letter is ready to be picked up at the front desk.

## 2015-12-09 NOTE — Telephone Encounter (Signed)
Pt called stating she needs a letter stating when she had her cpx last year Please advise when ready for pick up

## 2016-01-19 ENCOUNTER — Other Ambulatory Visit: Payer: Self-pay | Admitting: Family Medicine

## 2016-01-19 DIAGNOSIS — R5383 Other fatigue: Secondary | ICD-10-CM

## 2016-01-19 DIAGNOSIS — E7849 Other hyperlipidemia: Secondary | ICD-10-CM

## 2016-01-19 DIAGNOSIS — Z114 Encounter for screening for human immunodeficiency virus [HIV]: Secondary | ICD-10-CM

## 2016-01-19 DIAGNOSIS — E0590 Thyrotoxicosis, unspecified without thyrotoxic crisis or storm: Secondary | ICD-10-CM

## 2016-01-19 DIAGNOSIS — E559 Vitamin D deficiency, unspecified: Secondary | ICD-10-CM

## 2016-01-19 DIAGNOSIS — Z1159 Encounter for screening for other viral diseases: Secondary | ICD-10-CM

## 2016-01-26 ENCOUNTER — Other Ambulatory Visit: Payer: BLUE CROSS/BLUE SHIELD

## 2016-01-27 ENCOUNTER — Other Ambulatory Visit (INDEPENDENT_AMBULATORY_CARE_PROVIDER_SITE_OTHER): Payer: Commercial Managed Care - PPO

## 2016-01-27 DIAGNOSIS — E559 Vitamin D deficiency, unspecified: Secondary | ICD-10-CM

## 2016-01-27 DIAGNOSIS — R5383 Other fatigue: Secondary | ICD-10-CM

## 2016-01-27 DIAGNOSIS — E0590 Thyrotoxicosis, unspecified without thyrotoxic crisis or storm: Secondary | ICD-10-CM | POA: Diagnosis not present

## 2016-01-27 DIAGNOSIS — E784 Other hyperlipidemia: Secondary | ICD-10-CM | POA: Diagnosis not present

## 2016-01-27 DIAGNOSIS — Z114 Encounter for screening for human immunodeficiency virus [HIV]: Secondary | ICD-10-CM

## 2016-01-27 DIAGNOSIS — E7849 Other hyperlipidemia: Secondary | ICD-10-CM

## 2016-01-27 DIAGNOSIS — Z1159 Encounter for screening for other viral diseases: Secondary | ICD-10-CM

## 2016-01-27 LAB — LIPID PANEL
CHOL/HDL RATIO: 4
Cholesterol: 187 mg/dL (ref 0–200)
HDL: 44.5 mg/dL (ref >39.00)
LDL Cholesterol: 121 mg/dL — ABNORMAL HIGH (ref 0–99)
NonHDL: 142.78
TRIGLYCERIDES: 110 mg/dL (ref 0.0–149.0)
VLDL: 22 mg/dL (ref 0.0–40.0)

## 2016-01-27 LAB — HEPATIC FUNCTION PANEL
ALBUMIN: 4 g/dL (ref 3.5–5.2)
ALK PHOS: 84 U/L (ref 39–117)
ALT: 11 U/L (ref 0–35)
AST: 18 U/L (ref 0–37)
Bilirubin, Direct: 0 mg/dL (ref 0.0–0.3)
TOTAL PROTEIN: 7.5 g/dL (ref 6.0–8.3)
Total Bilirubin: 0.2 mg/dL (ref 0.2–1.2)

## 2016-01-27 LAB — BASIC METABOLIC PANEL
BUN: 13 mg/dL (ref 6–23)
CALCIUM: 9.2 mg/dL (ref 8.4–10.5)
CHLORIDE: 106 meq/L (ref 96–112)
CO2: 30 mEq/L (ref 19–32)
CREATININE: 0.83 mg/dL (ref 0.40–1.20)
GFR: 91.06 mL/min (ref >60.00)
Glucose, Bld: 104 mg/dL — ABNORMAL HIGH (ref 70–99)
Potassium: 3.5 mEq/L (ref 3.5–5.1)
Sodium: 142 mEq/L (ref 135–145)

## 2016-01-27 LAB — CBC WITH DIFFERENTIAL/PLATELET
BASOS ABS: 0 10*3/uL (ref 0.0–0.1)
BASOS PCT: 0.5 % (ref 0.0–3.0)
EOS ABS: 0.1 10*3/uL (ref 0.0–0.7)
Eosinophils Relative: 1.1 % (ref 0.0–5.0)
HEMATOCRIT: 35.2 % — AB (ref 36.0–46.0)
HEMOGLOBIN: 11.7 g/dL — AB (ref 12.0–15.0)
LYMPHS PCT: 49.7 % — AB (ref 12.0–46.0)
Lymphs Abs: 3.6 10*3/uL (ref 0.7–4.0)
MCHC: 33.1 g/dL (ref 30.0–36.0)
MCV: 77.1 fl — ABNORMAL LOW (ref 78.0–100.0)
Monocytes Absolute: 0.5 10*3/uL (ref 0.1–1.0)
Monocytes Relative: 7.4 % (ref 3.0–12.0)
Neutro Abs: 3 10*3/uL (ref 1.4–7.7)
Neutrophils Relative %: 41.3 % — ABNORMAL LOW (ref 43.0–77.0)
Platelets: 267 10*3/uL (ref 150.0–400.0)
RBC: 4.57 Mil/uL (ref 3.87–5.11)
RDW: 14 % (ref 11.5–15.5)
WBC: 7.3 10*3/uL (ref 4.0–10.5)

## 2016-01-27 LAB — T3, FREE: T3 FREE: 3.2 pg/mL (ref 2.3–4.2)

## 2016-01-27 LAB — T4, FREE: Free T4: 0.71 ng/dL (ref 0.60–1.60)

## 2016-01-27 LAB — TSH: TSH: 1.52 u[IU]/mL (ref 0.35–4.50)

## 2016-01-27 LAB — VITAMIN D 25 HYDROXY (VIT D DEFICIENCY, FRACTURES): VITD: 25.22 ng/mL — AB (ref 30.00–100.00)

## 2016-01-28 ENCOUNTER — Ambulatory Visit (INDEPENDENT_AMBULATORY_CARE_PROVIDER_SITE_OTHER): Payer: Commercial Managed Care - PPO | Admitting: Family Medicine

## 2016-01-28 ENCOUNTER — Encounter: Payer: Self-pay | Admitting: Family Medicine

## 2016-01-28 VITALS — BP 100/74 | HR 76 | Temp 97.7°F | Ht 68.0 in | Wt 231.8 lb

## 2016-01-28 DIAGNOSIS — Z0000 Encounter for general adult medical examination without abnormal findings: Secondary | ICD-10-CM | POA: Diagnosis not present

## 2016-01-28 LAB — HIV ANTIBODY (ROUTINE TESTING W REFLEX): HIV 1&2 Ab, 4th Generation: NONREACTIVE

## 2016-01-28 LAB — HEPATITIS C ANTIBODY: HCV AB: NEGATIVE

## 2016-01-28 NOTE — Progress Notes (Signed)
Pre visit review using our clinic review tool, if applicable. No additional management support is needed unless otherwise documented below in the visit note. 

## 2016-01-28 NOTE — Progress Notes (Signed)
Dr. Frederico Hamman T. Symphany Fleissner, MD, [facility] Sports Medicine [place], [address] Phone: [phone] Fax: [phone]  01/28/2016  Patient: Annette Romero, MRN: [id number], DOB: 1960-08-05, 55 y.o.  Primary Physician:  Owens Loffler, MD   Chief Complaint  Patient presents with  . Annual Exam   Subjective:   Annette Romero is a 55 y.o. pleasant patient who presents with the following:  Health Maintenance Summary Reviewed and updated, unless pt declines services.  Tobacco History Reviewed. Non-smoker Alcohol: No concerns, no excessive use Exercise Habits: Walking 1-2 days a week, STD concerns: none Drug Use: None Birth control method: hyst Menses regular: n/a Lumps or breast concerns: no Breast Cancer Family History: no  Health Maintenance  Topic Date Due  . MAMMOGRAM  07/07/2014  . COLONOSCOPY  01/28/2016 (Originally 11/07/2008)  . TETANUS/TDAP  01/26/2025  . INFLUENZA VACCINE  Completed  . Hepatitis C Screening  Completed  . HIV Screening  Completed    Immunization History  Administered Date(s) Administered  . Influenza,inj,Quad PF,36+ Mos 01/27/2015  . Influenza-Unspecified 01/20/2016  . Tdap 01/27/2015   Patient Active Problem List   Diagnosis Date Noted  . Paroxysmal SVT (supraventricular tachycardia) ([place]) 10/11/2013  . Hyperthyroidism   . Heart murmur   . Vitamin D deficiency   . Hypertension   . GERD (gastroesophageal reflux disease)    Past Medical History:  Diagnosis Date  . GERD (gastroesophageal reflux disease)   . Heart murmur   . Hypertension   . Hyperthyroidism   . Tachycardia    with increased thyroid level  . Vitamin D deficiency    Past Surgical History:  Procedure Laterality Date  . BREAST BIOPSY  1974   breast cyst  . CESAREAN SECTION  1991  . VAGINAL HYSTERECTOMY  2001   heavy bleeding due to fibroids, not cancer   Social History   Social History  . Marital status: Married   Spouse name: N/A  . Number of children: 2  . Years of education: N/A   Occupational History  . 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 Smoking status: Never Smoker  . Smokeless tobacco: Never Used  . Alcohol use No  . Drug use: No  . Sexual activity: Not on file   Other Topics Concern  . 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 [place]   Family History  Problem Relation Age of Onset  . CVA Father   . Heart attack Father   . Heart failure Father   . Drug abuse Brother     d/c from drugs   Allergies  Allergen Reactions  . Zantac [Ranitidine Hcl] Rash    Medication list has been reviewed and updated.   General: Denies fever, chills, sweats. No significant weight loss. Eyes: Denies blurring,significant itching ENT: Denies earache, sore throat, and hoarseness.  Cardiovascular: Denies chest pains, palpitations, dyspnea on exertion,  Respiratory: Denies cough, dyspnea at rest,wheeezing Breast: no concerns about lumps GI: Denies nausea, vomiting, diarrhea, constipation, change in bowel habits, abdominal pain, melena, hematochezia GU: Denies dysuria, hematuria, urinary hesitancy, nocturia, denies STD risk, no concerns about discharge Musculoskeletal: Denies back pain, joint pain Derm: Denies rash, itching Neuro: Denies  paresthesias, frequent falls, frequent headaches Psych: Denies depression, anxiety Endocrine: Denies cold  intolerance, heat intolerance, polydipsia Heme: Denies enlarged lymph nodes Allergy: No hayfever  Objective:   BP 100/74   Pulse 76   Temp 97.7 F (36.5 C) (Oral)   Ht _0  (1.727 m)   Wt 231 lb 12 oz (105.1 kg)   BMI 35.24 kg/m  No exam data present  GEN: well developed, well nourished, no acute distress Eyes: conjunctiva and lids normal, PERRLA, EOMI ENT: TM clear, nares clear, oral exam WNL Neck: supple, no  lymphadenopathy, no thyromegaly, no JVD Pulm: clear to auscultation and percussion, respiratory effort normal CV: regular rate and rhythm, S1-S2, no murmur, rub or gallop, no bruits Chest: no scars, masses, no lumps BREAST: breast exam declined GI: soft, non-tender; no hepatosplenomegaly, masses; active bowel sounds all quadrants GU: GU exam declined Lymph: no cervical, axillary or inguinal adenopathy MSK: gait normal, muscle tone and strength WNL, no joint swelling, effusions, discoloration, crepitus  SKIN: clear, good turgor, color WNL, no rashes, lesions, or ulcerations Neuro: normal mental status, normal strength, sensation, and motion Psych: alert; oriented to person, place and time, normally interactive and not anxious or depressed in appearance.   All labs reviewed with patient. Lipids:    Component Value Date/Time   CHOL 187 01/27/2016 0901   TRIG 110.0 01/27/2016 0901   HDL 44.50 01/27/2016 0901   LDLDIRECT 142.2 01/01/2013 1113   VLDL 22.0 01/27/2016 0901   CHOLHDL 4 01/27/2016 0901   CBC: CBC Latest Ref Rng & Units 01/27/2016 01/22/2015 01/01/2013  WBC 4.0 - 10.5 K/uL 7.3 7.2 7.1  Hemoglobin 12.0 - 15.0 g/dL 11.7(L) 12.8 12.8  Hematocrit 36.0 - 46.0 % 35.2(L) 39.5 38.7  Platelets 150.0 - 400.0 K/uL 267.0 282.0 096.2    Basic Metabolic Panel:    Component Value Date/Time   NA 142 01/27/2016 0901   NA 141 11/14/2013 1606   K 3.5 01/27/2016 0901   CL 106 01/27/2016 0901   CO2 30 01/27/2016 0901   BUN 13 01/27/2016 0901   BUN 9 11/14/2013 1606   CREATININE 0.83 01/27/2016 0901   GLUCOSE 104 (H) 01/27/2016 0901   CALCIUM 9.2 01/27/2016 0901   Hepatic Function Latest Ref Rng & Units 01/27/2016 01/22/2015 01/01/2013  Total Protein 6.0 - 8.3 g/dL 7.5 8.0 7.5  Albumin 3.5 - 5.2 g/dL 4.0 4.2 3.8  AST 0 - 37 U/L _1 ALT 0 - 35 U/L _2 Alk Phosphatase 39 - 117 U/L 84 106 79  Total Bilirubin 0.2 - 1.2 mg/dL 0.2 0.3 0.5  Bilirubin, Direct 0.0 - 0.3 mg/dL  0.0 0.1 0.0    Lab Results  Component Value Date   TSH 1.52 01/27/2016   No results found.  Assessment and Plan:   Healthcare maintenance  She will call for mammography Declines colon - will call about cologuard  Health Maintenance Exam: The patient's preventative maintenance and recommended screening tests for an annual wellness exam were reviewed in full today. Brought up to date unless services declined.  Counselled on the importance of diet, exercise, and its role in overall health and mortality. The patient's FH and SH was reviewed, including their home life, tobacco status, and drug and alcohol status.  Follow-up: No Follow-up on file. Or follow-up in 1 year for complete physical examination  Signed,  Frederico Hamman T. Covey Baller, MD   Patient's Medications  New Prescriptions   No medications on file  Previous Medications   CYANOCOBALAMIN 1000 MCG/ML LIQD    Take 1,000 mg by  mouth daily.   DILTIAZEM (CARDIZEM CD) 120 MG 24 HR CAPSULE    TAKE 1 CAPSULE (120 MG TOTAL) BY MOUTH DAILY.   METOPROLOL (LOPRESSOR) 50 MG TABLET    Take 1 tablet (50 mg total) by mouth 2 (two) times daily.   MULTIPLE VITAMINS-MINERALS (MULTIPLE VITAMINS/WOMENS PO)    Take by mouth daily.   POTASSIUM CHLORIDE (K-DUR) 10 MEQ TABLET    Take 10 mEq by mouth daily.   TRAZODONE (DESYREL) 150 MG TABLET    TAKE 1 TABLET (150 MG TOTAL) BY MOUTH AT BEDTIME AS NEEDED FOR SLEEP.  Modified Medications   No medications on file  Discontinued Medications   HYDROCHLOROTHIAZIDE (MICROZIDE) 12.5 MG CAPSULE    Take 1 capsule (12.5 mg total) by mouth daily.   KLOR-CON M20 20 MEQ TABLET    TAKE 1 TABLET BY MOUTH EVERY OTHER DAY

## 2016-01-28 NOTE — Patient Instructions (Signed)
Vitamin D 2,000 units a day  Iron Sulfate 325 mg, 1 tablet a day

## 2016-04-29 ENCOUNTER — Emergency Department: Payer: Commercial Managed Care - PPO

## 2016-04-29 ENCOUNTER — Inpatient Hospital Stay
Admission: EM | Admit: 2016-04-29 | Discharge: 2016-05-02 | DRG: 494 | Disposition: A | Discharge status: Home / Self Care | Payer: Commercial Managed Care - PPO | Attending: Orthopedic Surgery | Admitting: Orthopedic Surgery

## 2016-04-29 ENCOUNTER — Encounter: Payer: Self-pay | Admitting: Emergency Medicine

## 2016-04-29 DIAGNOSIS — Z8781 Personal history of (healed) traumatic fracture: Secondary | ICD-10-CM

## 2016-04-29 DIAGNOSIS — E559 Vitamin D deficiency, unspecified: Secondary | ICD-10-CM | POA: Diagnosis present

## 2016-04-29 DIAGNOSIS — Z79899 Other long term (current) drug therapy: Secondary | ICD-10-CM

## 2016-04-29 DIAGNOSIS — Z888 Allergy status to other drugs, medicaments and biological substances status: Secondary | ICD-10-CM

## 2016-04-29 DIAGNOSIS — Z9889 Other specified postprocedural states: Secondary | ICD-10-CM

## 2016-04-29 DIAGNOSIS — D509 Iron deficiency anemia, unspecified: Secondary | ICD-10-CM | POA: Diagnosis present

## 2016-04-29 DIAGNOSIS — Z90710 Acquired absence of both cervix and uterus: Secondary | ICD-10-CM

## 2016-04-29 DIAGNOSIS — Z823 Family history of stroke: Secondary | ICD-10-CM

## 2016-04-29 DIAGNOSIS — J101 Influenza due to other identified influenza virus with other respiratory manifestations: Secondary | ICD-10-CM | POA: Diagnosis not present

## 2016-04-29 DIAGNOSIS — Y92039 Unspecified place in apartment as the place of occurrence of the external cause: Secondary | ICD-10-CM

## 2016-04-29 DIAGNOSIS — Z8249 Family history of ischemic heart disease and other diseases of the circulatory system: Secondary | ICD-10-CM

## 2016-04-29 DIAGNOSIS — K219 Gastro-esophageal reflux disease without esophagitis: Secondary | ICD-10-CM | POA: Diagnosis present

## 2016-04-29 DIAGNOSIS — Z419 Encounter for procedure for purposes other than remedying health state, unspecified: Secondary | ICD-10-CM

## 2016-04-29 DIAGNOSIS — M25571 Pain in right ankle and joints of right foot: Secondary | ICD-10-CM

## 2016-04-29 DIAGNOSIS — S82851A Displaced trimalleolar fracture of right lower leg, initial encounter for closed fracture: Secondary | ICD-10-CM | POA: Diagnosis not present

## 2016-04-29 DIAGNOSIS — R262 Difficulty in walking, not elsewhere classified: Secondary | ICD-10-CM

## 2016-04-29 DIAGNOSIS — W001XXA Fall from stairs and steps due to ice and snow, initial encounter: Secondary | ICD-10-CM | POA: Diagnosis present

## 2016-04-29 DIAGNOSIS — E0590 Thyrotoxicosis, unspecified without thyrotoxic crisis or storm: Secondary | ICD-10-CM | POA: Diagnosis present

## 2016-04-29 DIAGNOSIS — I10 Essential (primary) hypertension: Secondary | ICD-10-CM | POA: Diagnosis present

## 2016-04-29 HISTORY — DX: Iron deficiency anemia, unspecified: D50.9

## 2016-04-29 NOTE — ED Triage Notes (Signed)
Pt presents to ED via [facility] with c/o R foot pain. Per EMS pt fell going into her apartment last night. Per EMS pt c/o swelling and pain at this time.

## 2016-04-29 NOTE — ED Provider Notes (Signed)
[facility] Emergency Department Provider Note   ____________________________________________   First MD Initiated Contact with Patient 04/29/16 2343     (approximate)  I have reviewed the triage vital signs and the nursing notes.   HISTORY  Chief Complaint Foot Pain    HPI Annette Romero is a 56 y.o. female who comes into the hospital today with some right ankle pain. The patient reports that she fell after getting off of work yesterday. She was going up the stairs in the snow. This occurred around midnight last night. The patient reports that her right ankle hurts and hurts every time she tries to put weight on it. She has been taking Motrin and using DayQuil for cold. She's also been icing it and using Epsom salts. The patient reports that at rest the pain is not that bad but when she places weight on it is 8-9 out of 10 in intensity. The patient decided to come into the hospital today for evaluation. She denies hitting her head with the fall. She said she did hit her shoulder but that does not hurt at all. The patient is here for evaluation.   Past Medical History:  Diagnosis Date  . GERD (gastroesophageal reflux disease)   . Heart murmur   . Hypertension   . Hyperthyroidism   . Iron deficiency anemia   . Tachycardia    with increased thyroid level  . Vitamin D deficiency     Patient Active Problem List   Diagnosis Date Noted  . Closed right trimalleolar fracture 04/30/2016  . Paroxysmal SVT (supraventricular tachycardia) (HCC) 10/11/2013  . Hyperthyroidism   . Heart murmur   . Vitamin D deficiency   . Hypertension   . GERD (gastroesophageal reflux disease)     Past Surgical History:  Procedure Laterality Date  . BREAST BIOPSY  1974   breast cyst  . CESAREAN SECTION  1991  . VAGINAL HYSTERECTOMY  2001   heavy bleeding due to fibroids, not cancer    Prior to Admission medications   Medication Sig Start Date End Date Taking?  Authorizing Provider  Cyanocobalamin 1000 MCG/ML LIQD Take 1,000 mg by mouth daily.   Yes Historical Provider, MD  diltiazem (CARDIZEM CD) 120 MG 24 hr capsule TAKE 1 CAPSULE (120 MG TOTAL) BY MOUTH DAILY. 06/18/15  Yes Spencer Copland, MD  KLOR-CON M20 20 MEQ tablet Take 20 mEq by mouth every other day. 04/12/16  Yes Historical Provider, MD  metoprolol (LOPRESSOR) 50 MG tablet Take 1 tablet (50 mg total) by mouth 2 (two) times daily. 06/18/15  Yes Hannah Beat, MD  Multiple Vitamins-Minerals (MULTIPLE VITAMINS/WOMENS PO) Take by mouth daily.   Yes Historical Provider, MD  traZODone (DESYREL) 150 MG tablet TAKE 1 TABLET (150 MG TOTAL) BY MOUTH AT BEDTIME AS NEEDED FOR SLEEP. 10/10/15  Yes Hannah Beat, MD    Allergies Zantac [ranitidine hcl]  Family History  Problem Relation Age of Onset  . CVA Father   . Heart attack Father   . Heart failure Father   . Drug abuse Brother     d/c from drugs    Social History Social History  Substance Use Topics  . Smoking status: Never Smoker  . Smokeless tobacco: Never Used  . Alcohol use No    Review of Systems Constitutional: No fever/chills Eyes: No visual changes. ENT: No sore throat. Cardiovascular: Denies chest pain. Respiratory: Denies shortness of breath. Gastrointestinal: No abdominal pain.  No nausea, no vomiting.  No diarrhea.  No constipation. Genitourinary: Negative for dysuria. Musculoskeletal:Right ankle pain Skin: Negative for rash. Neurological: Negative for headaches, focal weakness or numbness.  10-point ROS otherwise negative.  ____________________________________________   PHYSICAL EXAM:  VITAL SIGNS: ED Triage Vitals  Enc Vitals Group     BP 04/29/16 2244 125/73     Pulse Rate 04/29/16 2244 92     Resp 04/29/16 2244 18     Temp 04/29/16 2244 99.2 F (37.3 C)     Temp Source 04/29/16 2244 Oral     SpO2 04/29/16 2244 100 %     Weight 04/29/16 2242 230 lb (104.3 kg)     Height 04/29/16 2242 5\' 8"  (1.727 m)      Head Circumference --      Peak Flow --      Pain Score 04/29/16 2243 10     Pain Loc --      Pain Edu? --      Excl. in GC? --     Constitutional: Alert and oriented. Well appearing and in Mild distress. Eyes: Conjunctivae are normal. PERRL. EOMI. Head: Atraumatic. Nose: No congestion/rhinnorhea. Mouth/Throat: Mucous membranes are moist.  Oropharynx non-erythematous. Cardiovascular: Normal rate, regular rhythm. Grossly normal heart sounds.  Good peripheral circulation. Respiratory: Normal respiratory effort.  No retractions. Lungs CTAB. Gastrointestinal: Soft and nontender. No distention. Positive bowel sounds Musculoskeletal: The patient has some mild swelling to her right ankle with some pain with passive range of motion and tenderness to palpation around the entire ankle. Sensation is intact motion is intact and color is intact.   Neurologic:  Normal speech and language.  Skin:  Skin is warm, dry and intact. Psychiatric: Mood and affect are normal.   ____________________________________________   LABS (all labs ordered are listed, but only abnormal results are displayed)  Labs Reviewed  CBC - Abnormal; Notable for the following:       Result Value   MCV 77.3 (*)    MCH 25.4 (*)    RDW 15.1 (*)    All other components within normal limits  BASIC METABOLIC PANEL - Abnormal; Notable for the following:    Calcium 8.8 (*)    All other components within normal limits   ____________________________________________  EKG  Normal ____________________________________________  RADIOLOGY  Right ankle xray Right foot xray ____________________________________________   PROCEDURES  Procedure(s) performed: please, see procedure note(s).  .Splint Application Date/Time: 04/30/2016 1:45 AM Performed by: Rebecka Apley Authorized by: Rebecka Apley   Consent:    Consent obtained:  Verbal   Consent given by:  Patient Pre-procedure details:    Sensation:   Normal Procedure details:    Laterality:  Right   Location:  Ankle   Ankle:  R ankle   Cast type:  Short leg   Splint type:  Sugar tong and short leg   Supplies:  Ortho-Glass Post-procedure details:    Pain:  Unchanged   Sensation:  Normal   Patient tolerance of procedure:  Tolerated well, no immediate complications    Critical Care performed: No  ____________________________________________   INITIAL IMPRESSION / ASSESSMENT AND PLAN / ED COURSE  Pertinent labs & imaging results that were available during my care of the patient were reviewed by me and considered in my medical decision making (see chart for details).  This is a 56 year old female who comes into the hospital today with some right ankle pain after a fall yesterday. The patient appears to have a trimalleolar fracture on x-ray. I will give  the patient a dose of morphine 4 mg as well as Zofran. I did contact the orthopedic surgeon on call Dr. Rosita KeaMenz and he reports that he will admit the patient for surgery tomorrow. I discussed this with the patient as well and she has no other questions or concerns. We will place a splint on the patient's right ankle.  Clinical Course as of Apr 30 200  Thu Apr 29, 2016  2340 1. Mildly displaced distal fibular fracture. Medial malleolar fracture with asymmetric widening of medial mortise. Additional fractures of the posterior malleolus of the distal tibia as well as the anterior, inferior distal tibia with articular extension.   DG Ankle Complete Right [AW]  2341 Coarse calcification adjacent to the cuboid, favor a large os with remote avulsion felt less likely. No definite acute osseous abnormality.  Partially visualized medial malleolar and fibular fracture   DG Foot Complete Right [AW]    Clinical Course User Index [AW] Rebecka ApleyAllison P Derran Sear, MD   The patient has been admitted to the orthopedic surgery service. She did receive a dose of morphine and  Zofran.  ____________________________________________   FINAL CLINICAL IMPRESSION(S) / ED DIAGNOSES  Final diagnoses:  Closed trimalleolar fracture of right ankle, initial encounter      NEW MEDICATIONS STARTED DURING THIS VISIT:  New Prescriptions   No medications on file     Note:  This document was prepared using Dragon voice recognition software and may include unintentional dictation errors.    Rebecka ApleyAllison P Mirage Pfefferkorn, MD 04/30/16 [phone]

## 2016-04-30 ENCOUNTER — Inpatient Hospital Stay: Payer: Commercial Managed Care - PPO

## 2016-04-30 ENCOUNTER — Inpatient Hospital Stay: Payer: Commercial Managed Care - PPO | Admitting: Anesthesiology

## 2016-04-30 ENCOUNTER — Encounter
Admission: EM | Disposition: A | Discharge status: Home / Self Care | Payer: Self-pay | Source: Home / Self Care | Attending: Orthopedic Surgery

## 2016-04-30 ENCOUNTER — Encounter: Payer: Self-pay | Admitting: Anesthesiology

## 2016-04-30 DIAGNOSIS — Z8249 Family history of ischemic heart disease and other diseases of the circulatory system: Secondary | ICD-10-CM | POA: Diagnosis not present

## 2016-04-30 DIAGNOSIS — K219 Gastro-esophageal reflux disease without esophagitis: Secondary | ICD-10-CM | POA: Diagnosis present

## 2016-04-30 DIAGNOSIS — J101 Influenza due to other identified influenza virus with other respiratory manifestations: Secondary | ICD-10-CM | POA: Diagnosis not present

## 2016-04-30 DIAGNOSIS — Z888 Allergy status to other drugs, medicaments and biological substances status: Secondary | ICD-10-CM | POA: Diagnosis not present

## 2016-04-30 DIAGNOSIS — W001XXA Fall from stairs and steps due to ice and snow, initial encounter: Secondary | ICD-10-CM | POA: Diagnosis present

## 2016-04-30 DIAGNOSIS — Y92039 Unspecified place in apartment as the place of occurrence of the external cause: Secondary | ICD-10-CM | POA: Diagnosis not present

## 2016-04-30 DIAGNOSIS — D509 Iron deficiency anemia, unspecified: Secondary | ICD-10-CM | POA: Diagnosis present

## 2016-04-30 DIAGNOSIS — S82851A Displaced trimalleolar fracture of right lower leg, initial encounter for closed fracture: Secondary | ICD-10-CM | POA: Diagnosis present

## 2016-04-30 DIAGNOSIS — E559 Vitamin D deficiency, unspecified: Secondary | ICD-10-CM | POA: Diagnosis present

## 2016-04-30 DIAGNOSIS — Z823 Family history of stroke: Secondary | ICD-10-CM | POA: Diagnosis not present

## 2016-04-30 DIAGNOSIS — Z90710 Acquired absence of both cervix and uterus: Secondary | ICD-10-CM | POA: Diagnosis not present

## 2016-04-30 DIAGNOSIS — E0590 Thyrotoxicosis, unspecified without thyrotoxic crisis or storm: Secondary | ICD-10-CM | POA: Diagnosis present

## 2016-04-30 DIAGNOSIS — I10 Essential (primary) hypertension: Secondary | ICD-10-CM | POA: Diagnosis present

## 2016-04-30 DIAGNOSIS — Z79899 Other long term (current) drug therapy: Secondary | ICD-10-CM | POA: Diagnosis not present

## 2016-04-30 HISTORY — PX: ORIF ANKLE FRACTURE: SHX5408

## 2016-04-30 HISTORY — DX: Displaced trimalleolar fracture of right lower leg, initial encounter for closed fracture: S82.851A

## 2016-04-30 LAB — BASIC METABOLIC PANEL
Anion gap: 6 (ref 5–15)
BUN: 9 mg/dL (ref 6–20)
CHLORIDE: 106 mmol/L (ref 101–111)
CO2: 25 mmol/L (ref 22–32)
CREATININE: 0.85 mg/dL (ref 0.44–1.00)
Calcium: 8.8 mg/dL — ABNORMAL LOW (ref 8.9–10.3)
GFR calc non Af Amer: >60 mL/min (ref >60)
GLUCOSE: 92 mg/dL (ref 65–99)
Potassium: 3.6 mmol/L (ref 3.5–5.1)
Sodium: 137 mmol/L (ref 135–145)

## 2016-04-30 LAB — URINALYSIS, ROUTINE W REFLEX MICROSCOPIC
BACTERIA UA: NONE SEEN
BILIRUBIN URINE: NEGATIVE
Glucose, UA: NEGATIVE mg/dL
HGB URINE DIPSTICK: NEGATIVE
Ketones, ur: 20 mg/dL — AB
LEUKOCYTES UA: NEGATIVE
NITRITE: NEGATIVE
PROTEIN: 30 mg/dL — AB
Specific Gravity, Urine: 1.029 (ref 1.005–1.030)
pH: 5 (ref 5.0–8.0)

## 2016-04-30 LAB — CBC
HEMATOCRIT: 36.9 % (ref 35.0–47.0)
HEMOGLOBIN: 12.1 g/dL (ref 12.0–16.0)
MCH: 25.4 pg — ABNORMAL LOW (ref 26.0–34.0)
MCHC: 32.8 g/dL (ref 32.0–36.0)
MCV: 77.3 fL — AB (ref 80.0–100.0)
Platelets: 194 10*3/uL (ref 150–440)
RBC: 4.77 MIL/uL (ref 3.80–5.20)
RDW: 15.1 % — ABNORMAL HIGH (ref 11.5–14.5)
WBC: 6.2 10*3/uL (ref 3.6–11.0)

## 2016-04-30 LAB — INFLUENZA PANEL BY PCR (TYPE A & B)
INFLBPCR: NEGATIVE
Influenza A By PCR: POSITIVE — AB

## 2016-04-30 SURGERY — OPEN REDUCTION INTERNAL FIXATION (ORIF) ANKLE FRACTURE
Anesthesia: Spinal | Site: Ankle | Laterality: Right | Wound class: Clean

## 2016-04-30 MED ORDER — ACETAMINOPHEN 650 MG RE SUPP
650.0000 mg | Freq: Four times a day (QID) | RECTAL | Status: DC | PRN
  Filled 2016-04-30: qty 1

## 2016-04-30 MED ORDER — BISACODYL 10 MG RE SUPP: 10.0000 mg | Freq: Every day | RECTAL | Status: DC | PRN

## 2016-04-30 MED ORDER — LIDOCAINE HCL (PF) 2 % IJ SOLN
INTRAMUSCULAR | Status: AC
  Filled 2016-04-30: qty 2

## 2016-04-30 MED ORDER — MORPHINE SULFATE (PF) 4 MG/ML IV SOLN
4.0000 mg | Freq: Once | INTRAVENOUS | Status: AC
Start: 2016-04-30 — End: 2016-04-30
  Administered 2016-04-30: 4 mg via INTRAVENOUS
  Filled 2016-04-30: qty 1

## 2016-04-30 MED ORDER — MIDAZOLAM HCL 2 MG/2ML IJ SOLN
INTRAMUSCULAR | Status: AC
  Filled 2016-04-30: qty 2

## 2016-04-30 MED ORDER — MAGNESIUM HYDROXIDE 400 MG/5ML PO SUSP: 30.0000 mL | Freq: Every day | ORAL | Status: DC | PRN

## 2016-04-30 MED ORDER — VITAMIN B-12 1000 MCG PO TABS
1000.0000 ug | ORAL_TABLET | Freq: Every day | ORAL | Status: DC
  Administered 2016-04-30 – 2016-05-02 (×3): 1000 ug via ORAL
  Filled 2016-04-30 (×3): qty 1

## 2016-04-30 MED ORDER — OSELTAMIVIR PHOSPHATE 75 MG PO CAPS
75.0000 mg | ORAL_CAPSULE | Freq: Two times a day (BID) | ORAL | Status: DC
  Administered 2016-04-30 – 2016-05-02 (×4): 75 mg via ORAL
  Filled 2016-04-30 (×4): qty 1

## 2016-04-30 MED ORDER — NEOMYCIN-POLYMYXIN B GU 40-200000 IR SOLN
Status: AC
  Filled 2016-04-30: qty 2

## 2016-04-30 MED ORDER — METHOCARBAMOL 1000 MG/10ML IJ SOLN
500.0000 mg | Freq: Four times a day (QID) | INTRAVENOUS | Status: DC | PRN
  Filled 2016-04-30: qty 5

## 2016-04-30 MED ORDER — PHENYLEPHRINE 40 MCG/ML (10ML) SYRINGE FOR IV PUSH (FOR BLOOD PRESSURE SUPPORT)
PREFILLED_SYRINGE | INTRAVENOUS | Status: AC
  Filled 2016-04-30: qty 10

## 2016-04-30 MED ORDER — SODIUM CHLORIDE 0.9 % IV SOLN
INTRAVENOUS | Status: DC
  Administered 2016-04-30 – 2016-05-01 (×3): via INTRAVENOUS

## 2016-04-30 MED ORDER — ZOLPIDEM TARTRATE 5 MG PO TABS: 5.0000 mg | ORAL_TABLET | Freq: Every evening | ORAL | Status: DC | PRN

## 2016-04-30 MED ORDER — NEOMYCIN-POLYMYXIN B GU 40-200000 IR SOLN
Status: DC | PRN
  Administered 2016-04-30: 2 mL

## 2016-04-30 MED ORDER — FENTANYL CITRATE (PF) 100 MCG/2ML IJ SOLN
INTRAMUSCULAR | Status: DC | PRN
  Administered 2016-04-30: 50 ug via INTRAVENOUS

## 2016-04-30 MED ORDER — METOCLOPRAMIDE HCL 10 MG PO TABS: 5.0000 mg | ORAL_TABLET | Freq: Three times a day (TID) | ORAL | Status: DC | PRN

## 2016-04-30 MED ORDER — FENTANYL CITRATE (PF) 100 MCG/2ML IJ SOLN
INTRAMUSCULAR | Status: AC
  Filled 2016-04-30: qty 2

## 2016-04-30 MED ORDER — ONDANSETRON HCL 4 MG/2ML IJ SOLN
4.0000 mg | Freq: Once | INTRAMUSCULAR | Status: AC
  Administered 2016-04-30: 4 mg via INTRAVENOUS
  Filled 2016-04-30: qty 2

## 2016-04-30 MED ORDER — EPHEDRINE SULFATE 50 MG/ML IJ SOLN
INTRAMUSCULAR | Status: DC | PRN
  Administered 2016-04-30 (×2): 10 mg via INTRAVENOUS

## 2016-04-30 MED ORDER — CEFAZOLIN SODIUM-DEXTROSE 2-4 GM/100ML-% IV SOLN
2.0000 g | Freq: Four times a day (QID) | INTRAVENOUS | Status: AC
  Administered 2016-04-30 – 2016-05-01 (×3): 2 g via INTRAVENOUS
  Filled 2016-04-30 (×3): qty 100

## 2016-04-30 MED ORDER — TRAZODONE HCL 50 MG PO TABS
150.0000 mg | ORAL_TABLET | Freq: Every day | ORAL | Status: DC
  Administered 2016-04-30 – 2016-05-01 (×2): 150 mg via ORAL
  Filled 2016-04-30 (×2): qty 1

## 2016-04-30 MED ORDER — POTASSIUM CHLORIDE CRYS ER 20 MEQ PO TBCR
20.0000 meq | EXTENDED_RELEASE_TABLET | ORAL | Status: DC
  Administered 2016-04-30 – 2016-05-02 (×2): 20 meq via ORAL
  Filled 2016-04-30 (×2): qty 1

## 2016-04-30 MED ORDER — EPHEDRINE 5 MG/ML INJ
INTRAVENOUS | Status: AC
  Filled 2016-04-30: qty 10

## 2016-04-30 MED ORDER — HYDROCHLOROTHIAZIDE 12.5 MG PO CAPS
12.5000 mg | ORAL_CAPSULE | Freq: Every day | ORAL | Status: DC
  Administered 2016-04-30 – 2016-05-02 (×3): 12.5 mg via ORAL
  Filled 2016-04-30 (×3): qty 1

## 2016-04-30 MED ORDER — PHENYLEPHRINE HCL 10 MG/ML IJ SOLN
INTRAMUSCULAR | Status: DC | PRN
  Administered 2016-04-30 (×2): 100 ug via INTRAVENOUS
  Administered 2016-04-30 (×2): 200 ug via INTRAVENOUS

## 2016-04-30 MED ORDER — ACETAMINOPHEN 325 MG PO TABS
650.0000 mg | ORAL_TABLET | Freq: Four times a day (QID) | ORAL | Status: DC | PRN
  Administered 2016-04-30 – 2016-05-01 (×3): 650 mg via ORAL
  Filled 2016-04-30 (×4): qty 2

## 2016-04-30 MED ORDER — CEFAZOLIN SODIUM 1 G IJ SOLR
1.0000 g | Freq: Once | INTRAMUSCULAR | Status: DC
  Filled 2016-04-30 (×2): qty 10

## 2016-04-30 MED ORDER — PROPOFOL 500 MG/50ML IV EMUL
INTRAVENOUS | Status: DC | PRN
  Administered 2016-04-30: 100 ug/kg/min via INTRAVENOUS

## 2016-04-30 MED ORDER — ONDANSETRON HCL 4 MG/2ML IJ SOLN: 4.0000 mg | Freq: Four times a day (QID) | INTRAMUSCULAR | Status: DC | PRN

## 2016-04-30 MED ORDER — OXYCODONE HCL 5 MG PO TABS
5.0000 mg | ORAL_TABLET | ORAL | Status: DC | PRN
  Administered 2016-04-30 (×3): 5 mg via ORAL
  Administered 2016-05-01 – 2016-05-02 (×4): 10 mg via ORAL
  Filled 2016-04-30 (×2): qty 1
  Filled 2016-04-30: qty 2
  Filled 2016-04-30: qty 1
  Filled 2016-04-30 (×3): qty 2

## 2016-04-30 MED ORDER — METOCLOPRAMIDE HCL 5 MG/ML IJ SOLN
5.0000 mg | Freq: Three times a day (TID) | INTRAMUSCULAR | Status: DC | PRN
Start: 2016-04-30 — End: 2016-05-02

## 2016-04-30 MED ORDER — DOCUSATE SODIUM 100 MG PO CAPS
100.0000 mg | ORAL_CAPSULE | Freq: Two times a day (BID) | ORAL | Status: DC
  Administered 2016-04-30 – 2016-05-02 (×4): 100 mg via ORAL
  Filled 2016-04-30 (×4): qty 1

## 2016-04-30 MED ORDER — ONDANSETRON HCL 4 MG/2ML IJ SOLN: 4.0000 mg | Freq: Once | INTRAMUSCULAR | Status: DC | PRN

## 2016-04-30 MED ORDER — MORPHINE SULFATE (PF) 2 MG/ML IV SOLN
2.0000 mg | INTRAVENOUS | Status: DC | PRN
  Administered 2016-04-30 (×2): 2 mg via INTRAVENOUS
  Filled 2016-04-30 (×2): qty 1

## 2016-04-30 MED ORDER — PROPOFOL 500 MG/50ML IV EMUL
INTRAVENOUS | Status: AC
  Filled 2016-04-30: qty 50

## 2016-04-30 MED ORDER — METOPROLOL TARTRATE 50 MG PO TABS
50.0000 mg | ORAL_TABLET | Freq: Two times a day (BID) | ORAL | Status: DC
  Administered 2016-04-30 – 2016-05-02 (×5): 50 mg via ORAL
  Filled 2016-04-30 (×5): qty 1

## 2016-04-30 MED ORDER — ONDANSETRON HCL 4 MG PO TABS: 4.0000 mg | ORAL_TABLET | Freq: Four times a day (QID) | ORAL | Status: DC | PRN

## 2016-04-30 MED ORDER — MIDAZOLAM HCL 5 MG/5ML IJ SOLN
INTRAMUSCULAR | Status: DC | PRN
  Administered 2016-04-30: 2 mg via INTRAVENOUS

## 2016-04-30 MED ORDER — MAGNESIUM CITRATE PO SOLN
1.0000 | Freq: Once | ORAL | Status: DC | PRN
  Filled 2016-04-30 (×2): qty 296

## 2016-04-30 MED ORDER — DIPHENHYDRAMINE HCL 12.5 MG/5ML PO ELIX: 12.5000 mg | ORAL_SOLUTION | ORAL | Status: DC | PRN

## 2016-04-30 MED ORDER — ENOXAPARIN SODIUM 40 MG/0.4ML ~~LOC~~ SOLN
40.0000 mg | SUBCUTANEOUS | Status: DC
  Administered 2016-05-01 – 2016-05-02 (×2): 40 mg via SUBCUTANEOUS
  Filled 2016-04-30 (×2): qty 0.4

## 2016-04-30 MED ORDER — FENTANYL CITRATE (PF) 100 MCG/2ML IJ SOLN: 25.0000 ug | INTRAMUSCULAR | Status: DC | PRN

## 2016-04-30 MED ORDER — METHOCARBAMOL 500 MG PO TABS
500.0000 mg | ORAL_TABLET | Freq: Four times a day (QID) | ORAL | Status: DC | PRN
  Administered 2016-04-30 (×2): 500 mg via ORAL
  Filled 2016-04-30 (×6): qty 1

## 2016-04-30 MED ORDER — DILTIAZEM HCL ER COATED BEADS 120 MG PO CP24
120.0000 mg | ORAL_CAPSULE | Freq: Every day | ORAL | Status: DC
  Administered 2016-04-30 – 2016-05-02 (×3): 120 mg via ORAL
  Filled 2016-04-30 (×3): qty 1

## 2016-04-30 MED ORDER — BUPIVACAINE IN DEXTROSE 0.75-8.25 % IT SOLN
INTRATHECAL | Status: DC | PRN
  Administered 2016-04-30 (×2): 1.6 mL via INTRATHECAL

## 2016-04-30 SURGICAL SUPPLY — 58 items
BANDAGE ACE 4X5 VEL STRL LF (GAUZE/BANDAGES/DRESSINGS) ×4 IMPLANT
BIT DRILL 2.5X2.75 QC CALB (BIT) ×2 IMPLANT
BIT DRILL 2.9X70 QC CALB (BIT) ×2 IMPLANT
BLADE SURG SZ10 CARB STEEL (BLADE) ×4 IMPLANT
BNDG ESMARK 4X12 TAN STRL LF (GAUZE/BANDAGES/DRESSINGS) ×2 IMPLANT
CANISTER SUCT 1200ML W/VALVE (MISCELLANEOUS) ×2 IMPLANT
CHLORAPREP W/TINT 26ML (MISCELLANEOUS) ×2 IMPLANT
CUFF TOURN 24 STER (MISCELLANEOUS) IMPLANT
CUFF TOURN 30 STER DUAL PORT (MISCELLANEOUS) ×2 IMPLANT
DRAPE C-ARM 42X72 X-RAY (DRAPES) ×2 IMPLANT
DRAPE C-ARMOR (DRAPES) ×2 IMPLANT
DRAPE FLUOR MINI C-ARM 54X84 (DRAPES) IMPLANT
DRAPE INCISE IOBAN 66X45 STRL (DRAPES) ×2 IMPLANT
DRAPE U-SHAPE 47X51 STRL (DRAPES) ×2 IMPLANT
DRSG EMULSION OIL 3X8 NADH (GAUZE/BANDAGES/DRESSINGS) ×2 IMPLANT
ELECT CAUTERY BLADE 6.4 (BLADE) ×2 IMPLANT
ELECT REM PT RETURN 9FT ADLT (ELECTROSURGICAL) ×2
ELECTRODE REM PT RTRN 9FT ADLT (ELECTROSURGICAL) ×1 IMPLANT
GAUZE PETRO XEROFOAM 1X8 (MISCELLANEOUS) ×2 IMPLANT
GAUZE SPONGE 4X4 12PLY STRL (GAUZE/BANDAGES/DRESSINGS) ×2 IMPLANT
GLOVE BIOGEL PI IND STRL 9 (GLOVE) ×1 IMPLANT
GLOVE BIOGEL PI INDICATOR 9 (GLOVE) ×1
GLOVE INDICATOR 7.5 STRL GRN (GLOVE) ×2 IMPLANT
GLOVE SURG SYN 9.0  PF PI (GLOVE) ×1
GLOVE SURG SYN 9.0 PF PI (GLOVE) ×1 IMPLANT
GOWN SRG 2XL LVL 4 RGLN SLV (GOWNS) ×1 IMPLANT
GOWN STRL NON-REIN 2XL LVL4 (GOWNS) ×1
GOWN STRL REUS W/ TWL LRG LVL3 (GOWN DISPOSABLE) ×1 IMPLANT
GOWN STRL REUS W/TWL LRG LVL3 (GOWN DISPOSABLE) ×2
HEMOVAC 400ML (MISCELLANEOUS)
KIT DRAIN HEMOVAC JP 7FR 400ML (MISCELLANEOUS) IMPLANT
KIT RM TURNOVER STRD PROC AR (KITS) ×2 IMPLANT
LABEL OR SOLS (LABEL) ×2 IMPLANT
NS IRRIG 1000ML POUR BTL (IV SOLUTION) ×2 IMPLANT
PACK EXTREMITY ARMC (MISCELLANEOUS) ×2 IMPLANT
PAD ABD DERMACEA PRESS 5X9 (GAUZE/BANDAGES/DRESSINGS) ×4 IMPLANT
PAD CAST CTTN 4X4 STRL (SOFTGOODS) ×2 IMPLANT
PAD PREP 24X41 OB/GYN DISP (PERSONAL CARE ITEMS) ×2 IMPLANT
PADDING CAST COTTON 4X4 STRL (SOFTGOODS) ×4
PLATE ACE 100DEG 6HOLE (Plate) ×2 IMPLANT
SCREW ACE CAN 4.0 34M (Screw) ×2 IMPLANT
SCREW CORTICAL 3.5MM  16MM (Screw) ×3 IMPLANT
SCREW CORTICAL 3.5MM 16MM (Screw) ×3 IMPLANT
SCREW NLOCK CANC HEX 4X14 (Screw) ×2 IMPLANT
SCREW NLOCK CANC HEX 4X16 (Screw) ×2 IMPLANT
SPLINT CAST 1 STEP 5X30 WHT (MISCELLANEOUS) ×2 IMPLANT
SPONGE LAP 18X18 5 PK (GAUZE/BANDAGES/DRESSINGS) IMPLANT
STAPLER SKIN PROX 35W (STAPLE) ×2 IMPLANT
STOCKINETTE STRL 6IN 960660 (GAUZE/BANDAGES/DRESSINGS) ×2 IMPLANT
SUT ETHILON 3-0 FS-10 30 BLK (SUTURE) ×2
SUT MNCRL AB 4-0 PS2 18 (SUTURE) ×4 IMPLANT
SUT VIC AB 0 CT1 36 (SUTURE) ×2 IMPLANT
SUT VIC AB 2-0 SH 27 (SUTURE) ×4
SUT VIC AB 2-0 SH 27XBRD (SUTURE) ×2 IMPLANT
SUT VIC AB 3-0 SH 27 (SUTURE) ×2
SUT VIC AB 3-0 SH 27X BRD (SUTURE) ×1 IMPLANT
SUTURE EHLN 3-0 FS-10 30 BLK (SUTURE) ×1 IMPLANT
SYRINGE 10CC LL (SYRINGE) ×2 IMPLANT

## 2016-04-30 NOTE — Progress Notes (Signed)
Dr. Truett PernaMentz rounded on pt in room 348.  Informed of elevated temp last night and urine  Specimen result in Epic. Request for home meds to be ordered.

## 2016-04-30 NOTE — Anesthesia Preprocedure Evaluation (Addendum)
Anesthesia Evaluation  Patient identified by MRN, date of birth, ID band Patient awake    Reviewed: Allergy & Precautions, NPO status , Patient's Chart, lab work & pertinent test results, reviewed documented beta blocker date and time   Airway Mallampati: II  TM Distance: >3 FB     Dental  (+) Chipped   Pulmonary           Cardiovascular hypertension, Pt. on medications and Pt. on home beta blockers      Neuro/Psych    GI/Hepatic GERD  Controlled,  Endo/Other  Hyperthyroidism   Renal/GU      Musculoskeletal   Abdominal   Peds  Hematology  (+) anemia ,   Anesthesia Other Findings Does not take blood thinners of any kind. Slight fever this am. No systemic signs. Pt feels well except for dry throat. No problems with SVT for the last several months.  Reproductive/Obstetrics                           Anesthesia Physical Anesthesia Plan  ASA: III  Anesthesia Plan: Spinal   Post-op Pain Management:    Induction:   Airway Management Planned:   Additional Equipment:   Intra-op Plan:   Post-operative Plan:   Informed Consent: I have reviewed the patients History and Physical, chart, labs and discussed the procedure including the risks, benefits and alternatives for the proposed anesthesia with the patient or authorized representative who has indicated his/her understanding and acceptance.     Plan Discussed with: CRNA  Anesthesia Plan Comments:         Anesthesia Quick Evaluation

## 2016-04-30 NOTE — Transfer of Care (Signed)
Immediate Anesthesia Transfer of Care Note  Patient: Annette CellaRebecca Romero  Procedure(s) Performed: Procedure(s): OPEN REDUCTION INTERNAL FIXATION (ORIF) ANKLE FRACTURE (Right)  Patient Location: PACU  Anesthesia Type:Spinal  Level of Consciousness: sedated  Airway & Oxygen Therapy: Patient Spontanous Breathing and Patient connected to face mask oxygen  Post-op Assessment: Report given to RN and Post -op Vital signs reviewed and stable  Post vital signs: Reviewed and stable  Last Vitals:  Vitals:   04/30/16 1245 04/30/16 1508  BP: 125/85 98/65  Pulse: 70 71  Resp: 20 14  Temp: (!) 38.6 C (!) 38.1 C    Last Pain:  Vitals:   04/30/16 1245  TempSrc: Tympanic  PainSc: 7          Complications: No apparent anesthesia complications

## 2016-04-30 NOTE — Anesthesia Procedure Notes (Signed)
Spinal  Patient location during procedure: OR Staffing Anesthesiologist: Berdine AddisonHOMAS, Laree Garron Performed: anesthesiologist  Preanesthetic Checklist Completed: patient identified, site marked, surgical consent, pre-op evaluation, timeout performed, IV checked and risks and benefits discussed Spinal Block Patient position: sitting Prep: Betadine Patient monitoring: heart rate, cardiac monitor, continuous pulse ox and blood pressure Approach: midline Location: L3-4 Injection technique: single-shot Needle Needle type: Pencil-Tip  Needle gauge: 25 G Needle length: 9 cm Assessment Sensory level: T10 Additional Notes 1358 Marcaine 1.176ml.

## 2016-04-30 NOTE — Consult Note (Signed)
[facility] [place] [facility]   PATIENT NAME: Annette CellaRebecca Baroni    MR#:  [id number]  DATE OF BIRTH:  [birth year]  DATE OF CONSULT:  04/30/2016  PRIMARY CARE PHYSICIAN: Hannah BeatSpencer Copland, MD   REQUESTING/REFERRING PHYSICIAN: Dr. Kennedy BuckerMichael Menz  CHIEF COMPLAINT:   Fever, flu  HISTORY OF PRESENT ILLNESS:  Annette Romero  is a 56 y.o. female with a known history of Hypertension, hyperthyroidism, GERD, presented to the hospital after a fall and noted to have a right ankle fracture. She is status post open reduction internal fixation of a right ankle fracture today. Patient was noted to have fever as high as 101-102 over the past 24-48 hours. She has a cough which is nonproductive and she works in a skilled nursing facility and underwent flu PCR which was positive for influenza B. Hospitalist services were contacted for consult. Patient denies any shortness of breath, body aches, nausea vomiting abdominal pain or any other associated symptoms presently. Her only complaint is right ankle pain from her recent surgery.  PAST MEDICAL HISTORY:   Past Medical History:  Diagnosis Date  . GERD (gastroesophageal reflux disease)   . Heart murmur   . Hypertension   . Hyperthyroidism   . Iron deficiency anemia   . Tachycardia    with increased thyroid level  . Vitamin D deficiency     PAST SURGICAL HISTOIRY:   Past Surgical History:  Procedure Laterality Date  . BREAST BIOPSY  1974   breast cyst  . CESAREAN SECTION  1991  . VAGINAL HYSTERECTOMY  2001   heavy bleeding due to fibroids, not cancer    SOCIAL HISTORY:   Social History  Substance Use Topics  . Smoking status: Never Smoker  . Smokeless tobacco: Never Used  . Alcohol use No    FAMILY HISTORY:   Family History  Problem Relation Age of Onset  . CVA Father   . Heart attack Father   . Heart failure Father   . Seizures Mother   . Drug abuse Brother     d/c from drugs    DRUG ALLERGIES:   Allergies   Allergen Reactions  . Zantac [Ranitidine Hcl] Rash    REVIEW OF SYSTEMS:   Review of Systems  Constitutional: Negative for fever and weight loss.  HENT: Negative for congestion, nosebleeds and tinnitus.   Eyes: Negative for blurred vision, double vision and redness.  Respiratory: Positive for cough. Negative for hemoptysis and shortness of breath.   Cardiovascular: Negative for chest pain, orthopnea, leg swelling and PND.  Gastrointestinal: Negative for abdominal pain, diarrhea, melena, nausea and vomiting.  Genitourinary: Negative for dysuria, hematuria and urgency.  Musculoskeletal: Positive for falls and joint pain (Right ankle pain).  Neurological: Negative for dizziness, tingling, sensory change, focal weakness, seizures, weakness and headaches.  Endo/Heme/Allergies: Negative for polydipsia. Does not bruise/bleed easily.  Psychiatric/Behavioral: Negative for depression and memory loss. The patient is not nervous/anxious.       MEDICATIONS AT HOME:   Prior to Admission medications   Medication Sig Start Date End Date Taking? Authorizing Provider  Cyanocobalamin 1000 MCG/ML LIQD Take 1,000 mg by mouth daily.   Yes Historical Provider, MD  diltiazem (CARDIZEM CD) 120 MG 24 hr capsule TAKE 1 CAPSULE (120 MG TOTAL) BY MOUTH DAILY. 06/18/15  Yes Spencer Copland, MD  hydrochlorothiazide (HYDRODIURIL) 12.5 MG tablet Take 12.5 mg by mouth daily.   Yes Historical Provider, MD  KLOR-CON M20 20 MEQ tablet Take 20 mEq by mouth  every other day. 04/12/16  Yes Historical Provider, MD  metoprolol (LOPRESSOR) 50 MG tablet Take 1 tablet (50 mg total) by mouth 2 (two) times daily. 06/18/15  Yes Hannah Beat, MD  Multiple Vitamins-Minerals (MULTIPLE VITAMINS/WOMENS PO) Take by mouth daily.   Yes Historical Provider, MD  traZODone (DESYREL) 150 MG tablet TAKE 1 TABLET (150 MG TOTAL) BY MOUTH AT BEDTIME AS NEEDED FOR SLEEP. 10/10/15  Yes Spencer Copland, MD      VITAL SIGNS:  Blood pressure (!)  101/57, pulse 80, temperature 99.8 F (37.7 C), temperature source Oral, resp. rate 16, height 5\' 8"  (1.727 m), weight 104.3 kg (230 lb), SpO2 100 %.  PHYSICAL EXAMINATION:   GENERAL:  56 y.o.-year-old patient lying in the bed with no acute distress.  EYES: Pupils equal, round, reactive to light and accommodation. No scleral icterus. Extraocular muscles intact.  HEENT: Head atraumatic, normocephalic. Oropharynx and nasopharynx clear.  NECK:  Supple, no jugular venous distention. No thyroid enlargement, no tenderness.  LUNGS: Normal breath sounds bilaterally, no wheezing, rales, rhonchi. No use of accessory muscles of respiration.  CARDIOVASCULAR: S1, S2, RRR. No murmurs, rubs, gallops, clicks.   ABDOMEN: Soft, nontender, nondistended. Bowel sounds present. No organomegaly or mass.  EXTREMITIES: No pedal edema, cyanosis, or clubbing.  Right ankle dressing from surgery today.  NEUROLOGIC: Cranial nerves II through XII are intact. No focal motor or sensory deficits appreciated bilaterally  PSYCHIATRIC: The patient is alert and oriented x 3. Good affect SKIN: No obvious rash, lesion, or ulcer.   LABORATORY PANEL:   CBC  Recent Labs Lab 04/30/16 0027  WBC 6.2  HGB 12.1  HCT 36.9  PLT 194   ------------------------------------------------------------------------------------------------------------------  Chemistries   Recent Labs Lab 04/30/16 0027  NA 137  K 3.6  CL 106  CO2 25  GLUCOSE 92  BUN 9  CREATININE 0.85  CALCIUM 8.8*   ------------------------------------------------------------------------------------------------------------------  Cardiac Enzymes No results for input(s): TROPONINI in the last 168 hours. ------------------------------------------------------------------------------------------------------------------  RADIOLOGY:  Dg Ankle 2 Views Right  Result Date: 04/30/2016 CLINICAL DATA:  Postop for ankle fixation. EXAM: RIGHT ANKLE - 2 VIEW COMPARISON:   Earlier today FINDINGS: Plaster obscures bony detail. Plate and screw fixation of the fibula. Screw fixation of the medial tibia. Alignment is nearly anatomic, no acute hardware complication is identified. Soft tissue swelling. IMPRESSION: Expected appearance after bimalleolar ankle fixation. Electronically Signed   By: Jeronimo Greaves M.D.   On: 04/30/2016 16:13   Dg Ankle 2 Views Right  Result Date: 04/30/2016 CLINICAL DATA:  ORIF right ankle fracture. EXAM: RIGHT ANKLE - 2 VIEW; DG C-ARM 61-120 MIN COMPARISON:  04/29/2016 FINDINGS: Plate and screw fixation present of the lateral malleolar fracture. Single screw fixation of the medial malleolar fracture. No significant displacement of the posterior malleolar fracture. Stable ankle alignment remaining anatomic. Diffuse soft tissue swelling. No joint abnormality. IMPRESSION: Status post ORIF of the lateral and medial malleolar fractures with anatomic alignment. No complicating feature. Electronically Signed   By: Judie Petit.  Shick M.D.   On: 04/30/2016 15:11   Dg Ankle Complete Right  Result Date: 04/29/2016 CLINICAL DATA:  Fall with pain EXAM: RIGHT ANKLE - COMPLETE 3+ VIEW COMPARISON:  None. FINDINGS: Fracture of the anterior inferior distal tibia. Probable fracture of the posterior malleolus of the tibia as well. Oblique fracture of the distal fibular metaphysis with probable articular extension. Less than 1/4 shaft displacement laterally of distal fracture fragment. Medial malleolar fracture with asymmetric widening of the medial mortise. Small avulsion  fracture fragments adjacent to the medial malleolus. Large amount of soft tissue swelling. IMPRESSION: 1. Mildly displaced distal fibular fracture. Medial malleolar fracture with asymmetric widening of medial mortise. Additional fractures of the posterior malleolus of the distal tibia as well as the anterior, inferior distal tibia with articular extension. Electronically Signed   By: Jasmine Pang M.D.   On:  04/29/2016 23:35   Dg Foot Complete Right  Result Date: 04/29/2016 CLINICAL DATA:  Pain and swelling in the right foot EXAM: RIGHT FOOT COMPLETE - 3+ VIEW COMPARISON:  None. FINDINGS: No subluxation. Coarse corticated calcification lateral aspect of cuboid bone. No radiopaque foreign body. Small plantar calcaneal spur. Partially visualized medial malleolar and fibular fractures. IMPRESSION: Coarse calcification adjacent to the cuboid, favor a large os with remote avulsion felt less likely. No definite acute osseous abnormality. Partially visualized medial malleolar and fibular fracture Electronically Signed   By: Jasmine Pang M.D.   On: 04/29/2016 23:32   Dg C-arm 61-120 Min  Result Date: 04/30/2016 CLINICAL DATA:  ORIF right ankle fracture. EXAM: RIGHT ANKLE - 2 VIEW; DG C-ARM 61-120 MIN COMPARISON:  04/29/2016 FINDINGS: Plate and screw fixation present of the lateral malleolar fracture. Single screw fixation of the medial malleolar fracture. No significant displacement of the posterior malleolar fracture. Stable ankle alignment remaining anatomic. Diffuse soft tissue swelling. No joint abnormality. IMPRESSION: Status post ORIF of the lateral and medial malleolar fractures with anatomic alignment. No complicating feature. Electronically Signed   By: Judie Petit.  Shick M.D.   On: 04/30/2016 15:11     IMPRESSION AND PLAN:   56 year old female with past medical history of hypertension, hypothyroidism, GERD who presented to the hospital after a fall and noted to have a right ankle fracture. He was noted to have fever and cough and positive for influenza B.  1. Flu-patient is positive for influenza B based on PCR. -Continue droplet precautions, started on Tamiflu X 5 days.  - incentive spirometry, anti-tussives.    2. Right ankle fracture s/p surgery today - cont. Further care as per Ortho.  - cont. PT as tolerated.   3. HTN - cont. Lopressor, HCTZ, Cardizem  Thanks for the consult and will sign off.   Please call back if any further help needed.    All the records are reviewed and case discussed with Consulting provider. Management plans discussed with the patient, family and they are in agreement.  CODE STATUS: full code  TOTAL TIME TAKING CARE OF THIS PATIENT: 40 minutes.    Houston Siren M.D on 04/30/2016 at 8:13 PM  Between 7am to 6pm - Pager - [phone]  After 6pm go to [email or web address] - password [email or web address] [facility]  [facility] [place] [facility]  Office  [phone]  CC: Primary care Physician: Hannah Beat, MD

## 2016-04-30 NOTE — Anesthesia Post-op Follow-up Note (Cosign Needed)
Anesthesia QCDR form completed.        

## 2016-04-30 NOTE — Anesthesia Postprocedure Evaluation (Signed)
Anesthesia Post Note  Patient: Annette CellaRebecca Romero  Procedure(s) Performed: Procedure(s) (LRB): OPEN REDUCTION INTERNAL FIXATION (ORIF) ANKLE FRACTURE (Right)  Patient location during evaluation: PACU Anesthesia Type: Spinal Level of consciousness: oriented and awake and alert Pain management: pain level controlled Vital Signs Assessment: post-procedure vital signs reviewed and stable Respiratory status: spontaneous breathing, respiratory function stable and patient connected to nasal cannula oxygen Cardiovascular status: blood pressure returned to baseline and stable Postop Assessment: no headache and no backache Anesthetic complications: no     Last Vitals:  Vitals:   04/30/16 1645 04/30/16 1746  BP: 114/64 117/71  Pulse: 70 74  Resp: 18 18  Temp: 37.3 C 37.4 C    Last Pain:  Vitals:   04/30/16 1746  TempSrc: Oral  PainSc:                  Leidy Massar S

## 2016-04-30 NOTE — Progress Notes (Signed)
PT Cancellation Note  Patient Details Name: Annette Romero MRN: [id number] DOB: 07-Dec-1960   Cancelled Treatment:    Reason Eval/Treat Not Completed: Patient not medically ready (Consult received and chart reviewed.  Patient scheduled for surgical repair of displaced ankle fracture this date.  Will hold PT evaluation until procedure complete and patient cleared for activity.)   Alainna Stawicki H. Manson PasseyBrown, PT, DPT, NCS 04/30/16, 10:00 AM [phone]

## 2016-04-30 NOTE — Progress Notes (Signed)
Pt influenza PCR positive. MD Rosita KeaMenz notified, pt already on droplet precautions, hospitalist consult ordered. MD Sainaini notified, Tamiflu ordered.

## 2016-04-30 NOTE — ED Notes (Signed)
Dr. Webster at the bedside for pt evaluation 

## 2016-04-30 NOTE — OR Nursing (Signed)
[facility]. RN to speak with Dr. Rosita KeaMenz regarding her metoprolol being started for pt.  Patient has dry cough, low grade temp (possible URI) informed Dr. Maisie Fushomas, anesthesia of this and he stated that Dr. Rosita KeaMenz would have to assess patient to ensure patient ok for surgery.  To send ancef 1gm with chart when patient comes to [facility].

## 2016-04-30 NOTE — H&P (Signed)
Subjective:   Patient is a 56 y.o. female presents with Right ankle pain after fall on ice. Onset of symptoms was abrupt starting Last night after coming home from work she missed her last step and slipped on the ice where it had been showed at her apartment complex injuring her right ankle   ago with unchanged course since that time. The pain is located all around the ankle predominantly lateral. Patient describes the pain as aching continuous and rated as moderate.  Previous studies include ankle x-rays in the emergency room showing a displaced lateral malleolus and minimally displaced medial malleolus with nondisplaced posterior malleolus fragments.  Patient Active Problem List   Diagnosis Date Noted  . Closed right trimalleolar fracture 04/30/2016  . Paroxysmal SVT (supraventricular tachycardia) (HCC) 10/11/2013  . Hyperthyroidism   . Heart murmur   . Vitamin D deficiency   . Hypertension   . GERD (gastroesophageal reflux disease)    Past Medical History:  Diagnosis Date  . GERD (gastroesophageal reflux disease)   . Heart murmur   . Hypertension   . Hyperthyroidism   . Iron deficiency anemia   . Tachycardia    with increased thyroid level  . Vitamin D deficiency     Past Surgical History:  Procedure Laterality Date  . BREAST BIOPSY  1974   breast cyst  . CESAREAN SECTION  1991  . VAGINAL HYSTERECTOMY  2001   heavy bleeding due to fibroids, not cancer    Prescriptions Prior to Admission  Medication Sig Dispense Refill Last Dose  . Cyanocobalamin 1000 MCG/ML LIQD Take 1,000 mg by mouth daily.   04/28/2016 at Unknown time  . diltiazem (CARDIZEM CD) 120 MG 24 hr capsule TAKE 1 CAPSULE (120 MG TOTAL) BY MOUTH DAILY. 90 capsule 3 04/28/2016 at Unknown time  . hydrochlorothiazide (HYDRODIURIL) 12.5 MG tablet Take 12.5 mg by mouth daily.     Marland Kitchen. KLOR-CON M20 20 MEQ tablet Take 20 mEq by mouth every other day.  5 Past Week at Unknown time  . metoprolol (LOPRESSOR) 50 MG tablet Take 1  tablet (50 mg total) by mouth 2 (two) times daily. 180 tablet 3 04/28/2016 at Unknown time  . Multiple Vitamins-Minerals (MULTIPLE VITAMINS/WOMENS PO) Take by mouth daily.   04/28/2016 at Unknown time  . traZODone (DESYREL) 150 MG tablet TAKE 1 TABLET (150 MG TOTAL) BY MOUTH AT BEDTIME AS NEEDED FOR SLEEP. 30 tablet 5 prn   Allergies  Allergen Reactions  . Zantac [Ranitidine Hcl] Rash    Social History  Substance Use Topics  . Smoking status: Never Smoker  . Smokeless tobacco: Never Used  . Alcohol use No    Family History  Problem Relation Age of Onset  . CVA Father   . Heart attack Father   . Heart failure Father   . Drug abuse Brother     d/c from drugs    Review of Systems Pertinent items are noted in HPI.  Objective:   Patient Vitals for the past 8 hrs:  BP Temp Temp src Pulse Resp SpO2  04/30/16 0842 105/64 99.6 F (37.6 C) Oral 77 18 100 %  04/30/16 0554 108/70 99 F (37.2 C) Oral 81 18 100 %  04/30/16 0415 - 99.8 F (37.7 C) Oral - - -  04/30/16 0230 122/81 (!) 102.4 F (39.1 C) - 84 (!) 24 100 %  04/30/16 0147 134/79 - - 88 18 99 %   I/O last 3 completed shifts: In: -  Out: 250 [  Urine:250] No intake/output data recorded.    BP 105/64 (BP Location: Left Arm)   Pulse 77   Temp 99.6 F (37.6 C) (Oral)   Resp 18   Ht 5\' 8"  (1.727 m)   Wt 104.3 kg (230 lb)   SpO2 100%   BMI 34.97 kg/m  General appearance: alert, cooperative, appears stated age and mild distress Lungs: clear to auscultation bilaterally Heart: regular rate and rhythm, S1, S2 normal, no murmur, click, rub or gallop Extremities: Right ankle is in splint with intact sensation and brisk capillary refill to toes  ECG:  .  Data ReviewRadiology review: Trimalleolar ankle fracture with minimal displacement  Assessment:   Active Problems:   Closed right trimalleolar fracture   Plan:   ORIF of lateral and medial malleoli. Discussed risks in particular infection with patient. She'll need  to stay nonweightbearing for about 6 weeks. The seminoma O sister and hope she'll be able go home tomorrow

## 2016-04-30 NOTE — Op Note (Signed)
04/29/2016 - 04/30/2016  3:16 PM  PATIENT:  Annette Romero  56 y.o. female  PRE-OPERATIVE DIAGNOSIS:  right ankle fracture trimalleolar  POST-OPERATIVE DIAGNOSIS:  right ankle fracture trimalleolar  PROCEDURE:  Procedure(s): OPEN REDUCTION INTERNAL FIXATION (ORIF) ANKLE FRACTURE (Right) medial and lateral malleolus  SURGEON: Leitha SchullerMichael J Kellie Murrill, MD  ASSISTANTS: None  ANESTHESIA:   spinal  EBL:  Total I/O In: 900 [I.V.:900] Out: 360 [Urine:350; Blood:10]  BLOOD ADMINISTERED:none  DRAINS: none   LOCAL MEDICATIONS USED:  NONE  SPECIMEN:  No Specimen  DISPOSITION OF SPECIMEN:  N/A  COUNTS:  YES  TOURNIQUET:   34 minutes at 300 mmHg  IMPLANTS: Biomet 1/3 tubular plate with multiple screws 4.0 cannulated screw 1  DICTATION: .Dragon Dictation patient brought the operating room and after adequate anesthesia was obtained the right leg was prepped and draped in sterile fashion with a bump underneath right buttock. Tourniquet was applied after prepping and draping in usual sterile fashion appropriate patient identification and timeout procedures were completed. After this was completed the tourniquet was raised and a small incision was made over the distal fibula. The third tubular plate was contoured to fit the distal fibula and was slid subcutaneously along the distal fibula. The end of the plate was of the appropriate position on the distal lateral malleolus and the K wires used to hold it in position. On the lateral view the plate was in the appropriate position and through percutaneous technique with small stab incisions 3 cortical screws were placed bringing the plate down to the fibula. This helped reduce the fracture to essentially anatomic alignment. The K wires removed and the 2 most distal screw holes were filled with the 40 threaded cancellus screws. This gave very nice alignment to the distal fibula and restoration of the ankle mortise. A small incision was then made medially and a  K wire inserted through the medial malleolus drilled and a 34 mm cancellus malleolar screw inserted. This gave good compression to the fracture site and permanent C-arm views were obtained. The wounds were irrigated and closed with 3-0 Vicryl substantially and skin staples. Xeroform 4 x 4's web roll and splints applied with Ace wrap  PLAN OF CARE: Continue as inpatient  PATIENT DISPOSITION:  PACU - hemodynamically stable.

## 2016-05-01 MED ORDER — OSELTAMIVIR PHOSPHATE 75 MG PO CAPS: 75.0000 mg | ORAL_CAPSULE | Freq: Two times a day (BID) | ORAL | 0 refills | Status: DC

## 2016-05-01 MED ORDER — OXYCODONE HCL 5 MG PO TABS: 5.0000 mg | ORAL_TABLET | ORAL | 0 refills | Status: DC | PRN

## 2016-05-01 NOTE — Discharge Summary (Signed)
Physician Discharge Summary  Patient ID: Annette Romero MRN: [id number] DOB/AGE: 06-18-1960 56 y.o.  Admit date: 04/29/2016 Discharge date: 05/01/2016  Admission Diagnoses:  Closed trimalleolar fracture of right ankle, initial encounter [S82.851A] Right ankle trimalleolar fracture  Discharge Diagnoses: Patient Active Problem List   Diagnosis Date Noted  . Closed right trimalleolar fracture 04/30/2016  . Paroxysmal SVT (supraventricular tachycardia) (HCC) 10/11/2013  . Hyperthyroidism   . Heart murmur   . Vitamin D deficiency   . Hypertension   . GERD (gastroesophageal reflux disease)   Right ankle trimalleolar fracture  Past Medical History:  Diagnosis Date  . GERD (gastroesophageal reflux disease)   . Heart murmur   . Hypertension   . Hyperthyroidism   . Iron deficiency anemia   . Tachycardia    with increased thyroid level  . Vitamin D deficiency    Transfusion: None   Consultants (if any): Treatment Team:  Houston Siren, MD  Discharged Condition: Improved  Hospital Course: Annette Romero is an 56 y.o. female who was admitted 04/29/2016 with a diagnosis of right ankle trimalleolar fracture and went to the operating room on 04/29/2016 - 04/30/2016 and underwent the above named procedures.    Surgeries: Procedure(s): OPEN REDUCTION INTERNAL FIXATION (ORIF) ANKLE FRACTURE on 04/29/2016 - 04/30/2016 Patient tolerated the surgery well. Taken to PACU where she was stabilized and then transferred to the orthopedic floor.  Started on Lovenox 40mg  q 24 hrs. Foot pumps applied bilaterally at 80 mm. Heels elevated on bed with rolled towels. No evidence of DVT. Negative Homan. Physical therapy started on day #1 for gait training and transfer. OT started day #1 for ADL and assisted devices.  Patient's IV was d/c on POD1.   Pt's hospital stay was complicated by a diagnosis of influenza, started on Tamiflu  Implants: Biomet 1/3 tubular plate with multiple screws 4.0 cannulated  screw 1.  She was given perioperative antibiotics:  Anti-infectives    Start     Dose/Rate Route Frequency Ordered Stop   05/01/16 0000  oseltamivir (TAMIFLU) 75 MG capsule     75 mg Oral 2 times daily 05/01/16 0913     04/30/16 2200  oseltamivir (TAMIFLU) capsule 75 mg     75 mg Oral 2 times daily 04/30/16 1904 05/05/16 2159   04/30/16 1700  ceFAZolin (ANCEF) IVPB 2g/100 mL premix     2 g 200 mL/hr over 30 Minutes Intravenous Every 6 hours 04/30/16 1608 05/01/16 0603   04/30/16 1500  ceFAZolin (ANCEF) injection 1 g     1 g Intramuscular  Once 04/30/16 0004      .  She was given sequential compression devices, early ambulation, and Lovenox for DVT prophylaxis.  She benefited maximally from the hospital stay and there were no complications.    Recent vital signs:  Vitals:   05/01/16 0529 05/01/16 0746  BP:  104/60  Pulse:  75  Resp:  17  Temp: 100.3 F (37.9 C) 100.2 F (37.9 C)    Recent laboratory studies:  Lab Results  Component Value Date   HGB 12.1 04/30/2016   HGB 11.7 (L) 01/27/2016   HGB 12.8 01/22/2015   Lab Results  Component Value Date   WBC 6.2 04/30/2016   PLT 194 04/30/2016   No results found for: INR Lab Results  Component Value Date   NA 137 04/30/2016   K 3.6 04/30/2016   CL 106 04/30/2016   CO2 25 04/30/2016   BUN 9 04/30/2016   CREATININE 0.85  04/30/2016   GLUCOSE 92 04/30/2016    Discharge Medications:   Allergies as of 05/01/2016      Reactions   Zantac [ranitidine Hcl] Rash      Medication List    TAKE these medications   Cyanocobalamin 1000 MCG/ML Liqd Take 1,000 mg by mouth daily.   diltiazem 120 MG 24 hr capsule Commonly known as:  CARDIZEM CD TAKE 1 CAPSULE (120 MG TOTAL) BY MOUTH DAILY.   hydrochlorothiazide 12.5 MG tablet Commonly known as:  HYDRODIURIL Take 12.5 mg by mouth daily.   KLOR-CON M20 20 MEQ tablet Generic drug:  potassium chloride SA Take 20 mEq by mouth every other day.   metoprolol 50 MG  tablet Commonly known as:  LOPRESSOR Take 1 tablet (50 mg total) by mouth 2 (two) times daily.   MULTIPLE VITAMINS/WOMENS PO Take by mouth daily.   oseltamivir 75 MG capsule Commonly known as:  TAMIFLU Take 1 capsule (75 mg total) by mouth 2 (two) times daily.   oxyCODONE 5 MG immediate release tablet Commonly known as:  Oxy IR/ROXICODONE Take 1-2 tablets (5-10 mg total) by mouth every 4 (four) hours as needed for breakthrough pain.   traZODone 150 MG tablet Commonly known as:  DESYREL TAKE 1 TABLET (150 MG TOTAL) BY MOUTH AT BEDTIME AS NEEDED FOR SLEEP.            Durable Medical Equipment        Start     Ordered   04/30/16 1607  DME Walker rolling  Once    Question:  Patient needs a walker to treat with the following condition  Answer:  Closed right trimalleolar fracture   04/30/16 1608      Diagnostic Studies: Dg Ankle 2 Views Right  Result Date: 04/30/2016 CLINICAL DATA:  Postop for ankle fixation. EXAM: RIGHT ANKLE - 2 VIEW COMPARISON:  Earlier today FINDINGS: Plaster obscures bony detail. Plate and screw fixation of the fibula. Screw fixation of the medial tibia. Alignment is nearly anatomic, no acute hardware complication is identified. Soft tissue swelling. IMPRESSION: Expected appearance after bimalleolar ankle fixation. Electronically Signed   By: Jeronimo Greaves M.D.   On: 04/30/2016 16:13   Dg Ankle 2 Views Right  Result Date: 04/30/2016 CLINICAL DATA:  ORIF right ankle fracture. EXAM: RIGHT ANKLE - 2 VIEW; DG C-ARM 61-120 MIN COMPARISON:  04/29/2016 FINDINGS: Plate and screw fixation present of the lateral malleolar fracture. Single screw fixation of the medial malleolar fracture. No significant displacement of the posterior malleolar fracture. Stable ankle alignment remaining anatomic. Diffuse soft tissue swelling. No joint abnormality. IMPRESSION: Status post ORIF of the lateral and medial malleolar fractures with anatomic alignment. No complicating feature.  Electronically Signed   By: Judie Petit.  Shick M.D.   On: 04/30/2016 15:11   Dg Ankle Complete Right  Result Date: 04/29/2016 CLINICAL DATA:  Fall with pain EXAM: RIGHT ANKLE - COMPLETE 3+ VIEW COMPARISON:  None. FINDINGS: Fracture of the anterior inferior distal tibia. Probable fracture of the posterior malleolus of the tibia as well. Oblique fracture of the distal fibular metaphysis with probable articular extension. Less than 1/4 shaft displacement laterally of distal fracture fragment. Medial malleolar fracture with asymmetric widening of the medial mortise. Small avulsion fracture fragments adjacent to the medial malleolus. Large amount of soft tissue swelling. IMPRESSION: 1. Mildly displaced distal fibular fracture. Medial malleolar fracture with asymmetric widening of medial mortise. Additional fractures of the posterior malleolus of the distal tibia as well as the anterior, inferior  distal tibia with articular extension. Electronically Signed   By: Jasmine PangKim  Fujinaga M.D.   On: 04/29/2016 23:35   Dg Foot Complete Right  Result Date: 04/29/2016 CLINICAL DATA:  Pain and swelling in the right foot EXAM: RIGHT FOOT COMPLETE - 3+ VIEW COMPARISON:  None. FINDINGS: No subluxation. Coarse corticated calcification lateral aspect of cuboid bone. No radiopaque foreign body. Small plantar calcaneal spur. Partially visualized medial malleolar and fibular fractures. IMPRESSION: Coarse calcification adjacent to the cuboid, favor a large os with remote avulsion felt less likely. No definite acute osseous abnormality. Partially visualized medial malleolar and fibular fracture Electronically Signed   By: Jasmine PangKim  Fujinaga M.D.   On: 04/29/2016 23:32   Dg C-arm 61-120 Min  Result Date: 04/30/2016 CLINICAL DATA:  ORIF right ankle fracture. EXAM: RIGHT ANKLE - 2 VIEW; DG C-ARM 61-120 MIN COMPARISON:  04/29/2016 FINDINGS: Plate and screw fixation present of the lateral malleolar fracture. Single screw fixation of the medial malleolar  fracture. No significant displacement of the posterior malleolar fracture. Stable ankle alignment remaining anatomic. Diffuse soft tissue swelling. No joint abnormality. IMPRESSION: Status post ORIF of the lateral and medial malleolar fractures with anatomic alignment. No complicating feature. Electronically Signed   By: Judie PetitM.  Shick M.D.   On: 04/30/2016 15:11   Disposition: Plan will be for discharge home today pending sessions with physical therapy.  Continue Tamiflu upon discharge from the hospital.  Follow-up Information    Meriel PicaJames L McGhee, PA-C Follow up on 05/05/2016.   Specialty:  Physician Assistant Why:  Call for an appointment for staple removal. Contact information: [address] [facility] [place] [id number] [phone]          Signed: Meriel PicaJames L McGhee PA-C 05/01/2016, 9:17 AM

## 2016-05-01 NOTE — Evaluation (Signed)
Physical Therapy Evaluation Patient Details Name: Annette Romero MRN: [id number] DOB: March 22, 1961 Today's Date: 05/01/2016   History of Present Illness  Patient is a 56 y.o. female s/p fall and R ankle trimalleolar fx and ORIF by Dr. Rosita Kea on 19 JAN. Patient is a Public house manager at [employer]. PMH includes HTN, hyperthyroidism, and GERD. Patient has influenza B and is on droplet precautions.  Clinical Impression  Patient is a previously independent female admitted for above listed reasons. Upon evaluation, patient rated pain 8/10 and demonstrated limited activity tolerance due to increased pain intensity. Patient lives in apt. with son who works from home but has 5 steps with no rail to enter. On evaluation, patient demonstrates requirement of minimal assistance to perform bed mobility and transfers with decreased tolerance for gait due to increased pain/decreased muscular endurance/strength. At this time, it is believed that patient will benefit from further f/u at SNF until able to safely demonstrate improved gait tolerance and stair negotiation to return home. Patient will benefit from continued progressive PT during hospital stay to address deficits of decreased mobility, strength/endurance, balance, and function to return to work without limitations when appropriate.    Follow Up Recommendations SNF    Equipment Recommendations  Rolling walker with 5" wheels;Other (comment) (Tub bench)    Recommendations for Other Services       Precautions / Restrictions Precautions Precautions: Fall Restrictions Weight Bearing Restrictions: Yes RLE Weight Bearing: Non weight bearing      Mobility  Bed Mobility Overal bed mobility: Needs Assistance Bed Mobility: Supine to Sit     Supine to sit: Min assist     General bed mobility comments: Patient requires minimal assistance with R LE but is able to follow verbal commands to scoot to EOB.  Transfers Overall transfer level: Needs assistance Equipment  used: Rolling walker (2 wheeled) Transfers: Sit to/from Stand Sit to Stand: Min assist         General transfer comment: Patient requires minimal assistance to move from sit to stand and stand to sit. Verbal cues to maintain WB precautions and properly sequence.  Ambulation/Gait Ambulation/Gait assistance: Min guard Ambulation Distance (Feet): 2 Feet Assistive device: Rolling walker (2 wheeled)       General Gait Details: Patient able to demonstrate hop to gait pattern with RW and verbal cues for sequencing. Increased pain intensity limited activity tolerance.  Stairs            Wheelchair Mobility    Modified Rankin (Stroke Patients Only)       Balance Overall balance assessment: Needs assistance;History of Falls Sitting-balance support: Feet supported Sitting balance-Leahy Scale: Good     Standing balance support: Bilateral upper extremity supported Standing balance-Leahy Scale: Good                               Pertinent Vitals/Pain Pain Assessment: 0-10 Pain Score: 8  Pain Location: R ankle Pain Descriptors / Indicators: Aching Pain Intervention(s): Limited activity within patient's tolerance;Ice applied    Home Living Family/patient expects to be discharged to:: Private residence Living Arrangements: Children Available Help at Discharge: Family;Available 24 hours/day Type of Home: Apartment Home Access: Stairs to enter Entrance Stairs-Rails: None Entrance Stairs-Number of Steps: 5 Home Layout: One level Home Equipment: None      Prior Function Level of Independence: Independent         Comments: Patient is a previously independent female who lives at home with  son who works from home.     Hand Dominance        Extremity/Trunk Assessment   Upper Extremity Assessment Upper Extremity Assessment: Overall WFL for tasks assessed    Lower Extremity Assessment Lower Extremity Assessment: RLE deficits/detail;Overall WFL for  tasks assessed RLE Deficits / Details: R LE immobilized; sensation intact, good capillary refill RLE: Unable to fully assess due to immobilization       Communication   Communication: No difficulties  Cognition Arousal/Alertness: Awake/alert Behavior During Therapy: WFL for tasks assessed/performed Overall Cognitive Status: Within Functional Limits for tasks assessed                      General Comments      Exercises     Assessment/Plan    PT Assessment Patient needs continued PT services  PT Problem List Decreased strength;Decreased range of motion;Decreased activity tolerance;Decreased balance;Decreased mobility;Decreased knowledge of use of DME;Pain          PT Treatment Interventions DME instruction;Gait training;Stair training;Functional mobility training;Therapeutic activities;Therapeutic exercise;Balance training;Patient/family education    PT Goals (Current goals can be found in the Care Plan section)  Acute Rehab PT Goals Patient Stated Goal: "To go home" PT Goal Formulation: With patient Time For Goal Achievement: 05/15/16 Potential to Achieve Goals: Good    Frequency BID   Barriers to discharge Decreased caregiver support;Inaccessible home environment      Co-evaluation               End of Session Equipment Utilized During Treatment: Gait belt Activity Tolerance: Patient tolerated treatment well;Patient limited by pain Patient left: in chair;with call bell/phone within reach;with chair alarm set           Time: [phone] [id number] PT Time Calculation (min) (ACUTE ONLY): 20 min   Charges:   PT Evaluation $PT Eval Low Complexity: 1 Procedure     PT G Codes:        Neita CarpJulie Ann Burnie Therien, PT, DPT 05/01/2016, 10:39 AM

## 2016-05-01 NOTE — Progress Notes (Signed)
  Subjective: 1 Day Post-Op Procedure(s) (LRB): OPEN REDUCTION INTERNAL FIXATION (ORIF) ANKLE FRACTURE (Right) Patient reports pain as mild.   Patient is well, but has been diagnosed with influenza Plan is to go Home after hospital stay. Negative for chest pain and shortness of breath Fever: yes, 100.2 this AM Gastrointestinal:Negative for nausea and vomiting  Objective: Vital signs in last 24 hours: Temp:  [98.5 F (36.9 C)-103 F (39.4 C)] 100.2 F (37.9 C) (01/20 0746) Pulse Rate:  [66-84] 75 (01/20 0746) Resp:  [14-25] 17 (01/20 0746) BP: (98-125)/(57-85) 104/60 (01/20 0746) SpO2:  [95 %-100 %] 99 % (01/20 0746) Weight:  [104.3 kg (230 lb)] 104.3 kg (230 lb) (01/19 1245)  Intake/Output from previous day:  Intake/Output Summary (Last 24 hours) at 05/01/16 0908 Last data filed at 05/01/16 0630  Gross per 24 hour  Intake          2218.75 ml  Output             2335 ml  Net          -116.25 ml    Intake/Output this shift: No intake/output data recorded.  Labs:  Recent Labs  04/30/16 0027  HGB 12.1    Recent Labs  04/30/16 0027  WBC 6.2  RBC 4.77  HCT 36.9  PLT 194    Recent Labs  04/30/16 0027  NA 137  K 3.6  CL 106  CO2 25  BUN 9  CREATININE 0.85  GLUCOSE 92  CALCIUM 8.8*   No results for input(s): LABPT, INR in the last 72 hours.   EXAM General - Patient is Alert, Appropriate and Oriented Extremity - ABD soft Intact pulses distally Dorsiflexion/Plantar flexion intact Incision: dressing C/D/I No cellulitis present  Dressing/Incision - clean, dry, no drainage present to the right lower extremity. Motor Function - intact, moving foot and toes well on exam.   Past Medical History:  Diagnosis Date  . GERD (gastroesophageal reflux disease)   . Heart murmur   . Hypertension   . Hyperthyroidism   . Iron deficiency anemia   . Tachycardia    with increased thyroid level  . Vitamin D deficiency     Assessment/Plan: 1 Day Post-Op  Procedure(s) (LRB): OPEN REDUCTION INTERNAL FIXATION (ORIF) ANKLE FRACTURE (Right) Active Problems:   Closed right trimalleolar fracture  Estimated body mass index is 34.97 kg/m as calculated from the following:   Height as of this encounter: 5\' 8"  (1.727 m).   Weight as of this encounter: 104.3 kg (230 lb). Advance diet Up with therapy today, contact precautions.  Influenza positive, will continue tamiflu. Labs reviewed. Plan will be for possible discharge home today, [facility] next week for cast placement.  DVT Prophylaxis - Lovenox, Foot Pumps and TED hose Toe-touch weightbearing to the right leg.  Valeria BatmanJ. Lance Geoff Dacanay, PA-C [facility] 05/01/2016, 9:08 AM

## 2016-05-01 NOTE — Progress Notes (Signed)
Physical Therapy Treatment Patient Details Name: Annette Romero MRN: [id number] DOB: 1960-05-02 Today's Date: 05/01/2016    History of Present Illness Patient is a 56 y.o. female s/p fall and R ankle trimalleolar fx and ORIF by Dr. Rosita Kea on 19 JAN. Patient is a Public house manager at [employer]. PMH includes HTN, hyperthyroidism, and GERD. Patient has influenza B and is on droplet precautions.    PT Comments    Patient is better able to tolerate PT session this afternoon, rating pain 5/10 compared to 8/10. Able to ambulate 30', demonstrating hop to gait pattern with moderate fatigue. PT discussed d/c plan with patient, as she prefers to go home but admits to having 2 steps followed by a landing and another 3 steps to enter apt. Notes no railing being available and believes that RW will not fit on steps. Due to inability to maintain WB precautions to enter/exit apt., it is deemed that she is still unsafe to d/c home at this time. PT spoke to Dr. Ernest Pine at 1410 who agrees at this time. Plan to incorporate stair training at next session if tolerated to safely return home and prevent future falls.  Follow Up Recommendations  SNF     Equipment Recommendations  Rolling walker with 5" wheels;Other (comment)    Recommendations for Other Services       Precautions / Restrictions Precautions Precautions: Fall Restrictions Weight Bearing Restrictions: Yes RLE Weight Bearing: Non weight bearing    Mobility  Bed Mobility                  Transfers Overall transfer level: Needs assistance Equipment used: Rolling walker (2 wheeled) Transfers: Sit to/from Stand Sit to Stand: Min guard         General transfer comment: Patient requires CGA to move from sit to stand and shows decreased eccentric control when moving from stand to sit.  Ambulation/Gait Ambulation/Gait assistance: Min guard Ambulation Distance (Feet): 30 Feet Assistive device: Rolling walker (2 wheeled)       General Gait Details:  Patient demonstrates hop to gait pattern with mild unsteadiness and CGA to correct.    Stairs            Wheelchair Mobility    Modified Rankin (Stroke Patients Only)       Balance Overall balance assessment: Needs assistance;History of Falls Sitting-balance support: Feet supported Sitting balance-Leahy Scale: Good     Standing balance support: Bilateral upper extremity supported Standing balance-Leahy Scale: Good                      Cognition Arousal/Alertness: Awake/alert Behavior During Therapy: WFL for tasks assessed/performed Overall Cognitive Status: Within Functional Limits for tasks assessed                      Exercises General Exercises - Lower Extremity Ankle Circles/Pumps: AROM;Left;15 reps Quad Sets: Strengthening;15 reps Gluteal Sets: Strengthening;15 reps Long Arc Quad: Strengthening;15 reps Hip ABduction/ADduction: Strengthening;15 reps    General Comments        Pertinent Vitals/Pain Pain Assessment: 0-10 Pain Score: 5  Pain Location: R ankle Pain Descriptors / Indicators: Aching Pain Intervention(s): Limited activity within patient's tolerance;Monitored during session;Premedicated before session    Home Living                      Prior Function            PT Goals (current goals can now be  found in the care plan section) Acute Rehab PT Goals Patient Stated Goal: "To go home" PT Goal Formulation: With patient Time For Goal Achievement: 05/15/16 Potential to Achieve Goals: Good Progress towards PT goals: Progressing toward goals    Frequency    BID      PT Plan Current plan remains appropriate    Co-evaluation             End of Session Equipment Utilized During Treatment: Gait belt Activity Tolerance: Patient tolerated treatment well;Patient limited by pain Patient left: in chair;with call bell/phone within reach;with chair alarm set;with family/visitor present     Time:  [id number] PT Time Calculation (min) (ACUTE ONLY): 24 min  Charges:  $Gait Training: 8-22 mins $Therapeutic Exercise: 8-22 mins                    G Codes:      Neita CarpJulie Ann Gilliam Hawkes, PT, DPT 05/01/2016, 2:10 PM

## 2016-05-01 NOTE — Discharge Instructions (Signed)
NSTRUCTIONS AFTER Surgery  o Remove items at home which could result in a fall. This includes throw rugs or furniture in walking pathways o ICE to the affected joint every three hours while awake for 30 minutes at a time, for at least the first 3-5 days, and then as needed for pain and swelling.  Continue to use ice for pain and swelling. You may notice swelling that will progress down to the foot and ankle.  This is normal after surgery.  Elevate your leg when you are not up walking on it.   o Continue to use the breathing machine you got in the hospital (incentive spirometer) which will help keep your temperature down.  It is common for your temperature to cycle up and down following surgery, especially at night when you are not up moving around and exerting yourself.  The breathing machine keeps your lungs expanded and your temperature down.  -ASA 81mg  TWICE DAILY FOR DVT PROPHYLAXIS.  DIET:  As you were doing prior to hospitalization, we recommend a well-balanced diet.  DRESSING / WOUND CARE / SHOWERING -Keep right ankle splint intact and dry, do not remove.  ACTIVITY  o Increase activity slowly as tolerated, but follow the weight bearing instructions below.   o No driving for 6 weeks or until further direction given by your physician.  You cannot drive while taking narcotics.  o No lifting or carrying greater than 10 lbs. until further directed by your surgeon. o Avoid periods of inactivity such as sitting longer than an hour when not asleep. This helps prevent blood clots.  o You may return to work once you are authorized by your doctor.   WEIGHT BEARING  -Toe-touch weightbearing to the right lower extremity.  EXERCISES -As instructed by PT  CONSTIPATION  Constipation is defined medically as fewer than three stools per week and severe constipation as less than one stool per week.  Even if you have a regular bowel pattern at home, your normal regimen is likely to be disrupted due to  multiple reasons following surgery.  Combination of anesthesia, postoperative narcotics, change in appetite and fluid intake all can affect your bowels.   YOU MUST use at least one of the following options; they are listed in order of increasing strength to get the job done.  They are all available over the counter, and you may need to use some, POSSIBLY even all of these options:    Drink plenty of fluids (prune juice may be helpful) and high fiber foods Colace 100 mg by mouth twice a day  Senokot for constipation as directed and as needed Dulcolax (bisacodyl), take with full glass of water  Miralax (polyethylene glycol) once or twice a day as needed.  If you have tried all these things and are unable to have a bowel movement in the first 3-4 days after surgery call either your surgeon or your primary doctor.    If you experience loose stools or diarrhea, hold the medications until you stool forms back up.  If your symptoms do not get better within 1 week or if they get worse, check with your doctor.  If you experience "the worst abdominal pain ever" or develop nausea or vomiting, please contact the office immediately for further recommendations for treatment.  ITCHING:  If you experience itching with your medications, try taking only a single pain pill, or even half a pain pill at a time.  You can also use Benadryl over the counter for  itching or also to help with sleep.   TED HOSE STOCKINGS:  Use stockings on both legs until for at least 2 weeks or as directed by physician office. They may be removed at night for sleeping.  MEDICATIONS:  See your medication summary on the After Visit Summary that nursing will review with you.  You may have some home medications which will be placed on hold until you complete the course of blood thinner medication.  It is important for you to complete the blood thinner medication as prescribed.  PRECAUTIONS:  If you experience chest pain or shortness of breath  - call 911 immediately for transfer to the hospital emergency department.   If you develop a fever greater that 101 F, purulent drainage from wound, increased redness or drainage from wound, foul odor from the wound/dressing, or calf pain - CONTACT YOUR SURGEON.                                                   FOLLOW-UP APPOINTMENTS:  If you do not already have a post-op appointment, please call the office for an appointment to be seen by your surgeon.  Guidelines for how soon to be seen are listed in your After Visit Summary, but are typically between 1-4 weeks after surgery.  MAKE SURE YOU:   Understand these instructions.   Get help right away if you are not doing well or get worse.    Thank you for letting us be a part of your medical care team.  It is a privilege we respect greatly.  We hope these instructions will help you stay on track for a fast and full recovery!

## 2016-05-01 NOTE — Clinical Social Work Note (Signed)
CSW visited patient to discuss dc planning. Patient declined SNF and reported that she wants HH. Patient indicated that her son lives with her and can assist. CSW alerted RNCM. CSW signing off.  Annette PonderKaren Martha Jezabelle Chisolm, MSW, Theresia MajorsLCSWA [phone]

## 2016-05-01 NOTE — Care Management Note (Signed)
Case Management Note  Patient Details  Name: Annette CellaRebecca Helser MRN: [id number] Date of Birth: Oct 02, 1960  Subjective/Objective:     Discussed discharge planning with weekend SWer. [name] is now requesting to go home with home health rather than rehab. This Clinical research associatewriter [facility], and left a sticky note requesting home health orders from Physician or PA.  PT will try [name] on steps tomorrow.               Action/Plan:   Expected Discharge Date:  05/01/16               Expected Discharge Plan:     In-House Referral:     Discharge planning Services     Post Acute Care Choice:    Choice offered to:     DME Arranged:    DME Agency:     HH Arranged:    HH Agency:     Status of Service:     If discussed at [employer] Meetings, dates discussed:    Additional Comments:  Toren Tucholski A, RN 05/01/2016, 5:12 PM

## 2016-05-02 NOTE — Care Management Note (Signed)
Case Management Note  Patient Details  Name: Annette CellaRebecca Romero MRN: [id number] Date of Birth: Apr 12, 1961  Subjective/Objective:   A RW and crutches were delivered by [name]. A referral for HH-PT was called to Shea Stakesona George [facility] after discussing local home health providers with [name].  No other home health needs identified.                Action/Plan:   Expected Discharge Date:  05/01/16               Expected Discharge Plan:     In-House Referral:     Discharge planning Services     Post Acute Care Choice:    Choice offered to:     DME Arranged:    DME Agency:     HH Arranged:    HH Agency:     Status of Service:     If discussed at [employer] Meetings, dates discussed:    Additional Comments:  Sharilyn Geisinger A, RN 05/02/2016, 9:15 AM

## 2016-05-02 NOTE — Progress Notes (Signed)
ORTHOPAEDICS PROGRESS NOTE  PATIENT NAME: Annette CellaRebecca Labonte DOB: 1960-06-01  MRN: [id number]  POD # 2: Open reduction and internal fixation of right trimalleolar ankle fracture  Subjective: The patient is up in a chair resting well. Pain is under good control.  Objective: Vital signs in last 24 hours: Temp:  [98.9 F (37.2 C)-100.5 F (38.1 C)] 98.9 F (37.2 C) (01/21 0759) Pulse Rate:  [68-77] 72 (01/21 0759) Resp:  [17-20] 18 (01/21 0421) BP: (92-148)/(55-118) 107/63 (01/21 0759) SpO2:  [95 %-100 %] 100 % (01/21 0759)  Intake/Output from previous day: 01/20 0701 - 01/21 0700 In: 1800 [I.V.:1800] Out: 200 [Urine:200]   Recent Labs  04/30/16 0027  WBC 6.2  HGB 12.1  HCT 36.9  PLT 194  K 3.6  CL 106  CO2 25  BUN 9  CREATININE 0.85  GLUCOSE 92  CALCIUM 8.8*    EXAM General: Well-developed well-nourished female seen in no acute distress. Right lower extremity: Posterior splint is in place. Good capillary refill. Neurologic: Awake, alert, and oriented. Sensory motor function are grossly intact.  Assessment: Open reduction and internal fixation of a right bimalleolar ankle fracture  Secondary diagnoses: Vitamin D deficiency Iron deficiency anemia Hyperthyroidism Hypertension Gastroesophageal reflux disease  Plan: Notes from physical therapy were reviewed. The patient made excellent progress with therapy and was deemed safe with ambulation and stairs. Plan is to go Home today. She states her son is available to help at home.  James P. Angie FavaHooten, Jr. M.D.

## 2016-05-02 NOTE — Progress Notes (Signed)
Patient is to be discharged home with home health today. Walker and crutchers for pt has been delivered. Patient is in no acute distress at this time, and assessment is unchanged from this morning. Patient's IV is out, discharge paperwork has been discussed with patient/family and there are no questions or concerns at this time. Patient will be accompanied downstairs by staff and family via wheelchair.

## 2016-05-02 NOTE — Progress Notes (Signed)
Physical Therapy Treatment Patient Details Name: Annette Romero MRN: [id number] DOB: 1960/08/12 Today's Date: 05/02/2016    History of Present Illness Patient is a 56 y.o. female s/p fall and R ankle trimalleolar fx and ORIF by Dr. Rosita KeaMenz on 19 JAN. 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 PMH includes HTN, hyperthyroidism, and GERD. Patient has influenza B and is on droplet precautions.    PT Comments    Patient demonstrates significant improvements in activity tolerance at today's session. Patient able to ambulate with RW, maintaining WB precautions 100' with standing rest breaks as required. Patient demonstrates ability to ascend/descend 4 steps with bilateral rails with CGA and verbal cues for sequencing. Because of patient's described entry into apt. (2 steps followed by a landing followed by 3 steps, all without rails), it is believed that patient could safely ascend/descend with axillary crutches and CGA. She could then resume use of RW within her home. The discharge plan has been updated to reflect the above listed comments.  Follow Up Recommendations  Home health PT     Equipment Recommendations  Rolling walker with 5" wheels;Other (comment);Crutches (Tub bench)    Recommendations for Other Services       Precautions / Restrictions Precautions Precautions: Fall Restrictions Weight Bearing Restrictions: Yes RLE Weight Bearing: Non weight bearing    Mobility  Bed Mobility Overal bed mobility: Independent Bed Mobility: Supine to Sit     Supine to sit: Independent     General bed mobility comments: Patient performs bed mobility independently.  Transfers Overall transfer level: Modified independent Equipment used: Rolling walker (2 wheeled) Transfers: Sit to/from Stand Sit to Stand: Modified independent (Device/Increase time)         General transfer comment: Patient demonstrates modified independence moving from sit to stand and stand to sit, maintaining WB precautions.  Verbal cues for hand placement.  Ambulation/Gait Ambulation/Gait assistance: Min guard Ambulation Distance (Feet): 100 Feet Assistive device: Rolling walker (2 wheeled)       General Gait Details: Patient demonstrates hop to gait pattern with improved control, taking standing rest breaks as needed.   Stairs Stairs: Yes   Stair Management: Two rails Number of Stairs: 4 General stair comments: Patient demonstrates ability to ascend/descend 4 steps with bilateral rails, maintaining WB precautions with verbal cues for sequencing.  Wheelchair Mobility    Modified Rankin (Stroke Patients Only)       Balance Overall balance assessment: Modified Independent;History of Falls Sitting-balance support: Feet supported Sitting balance-Leahy Scale: Good     Standing balance support: Bilateral upper extremity supported Standing balance-Leahy Scale: Good                      Cognition Arousal/Alertness: Awake/alert Behavior During Therapy: WFL for tasks assessed/performed Overall Cognitive Status: Within Functional Limits for tasks assessed                      Exercises General Exercises - Lower Extremity Ankle Circles/Pumps: AROM;Left;15 reps Quad Sets: Strengthening;15 reps Gluteal Sets: Strengthening;15 reps Hip ABduction/ADduction: Strengthening;15 reps Straight Leg Raises: Strengthening;15 reps    General Comments        Pertinent Vitals/Pain Pain Assessment: 0-10 Pain Score: 7  Pain Location: R ankle Pain Descriptors / Indicators: Aching Pain Intervention(s): Limited activity within patient's tolerance;Monitored during session;Premedicated before session    Home Living                      Prior Function  PT Goals (current goals can now be found in the care plan section) Acute Rehab PT Goals Patient Stated Goal: "To go home" PT Goal Formulation: With patient Time For Goal Achievement: 05/15/16 Potential to Achieve Goals:  Good Progress towards PT goals: Progressing toward goals    Frequency    BID      PT Plan Discharge plan needs to be updated    Co-evaluation             End of Session Equipment Utilized During Treatment: Gait belt Activity Tolerance: Patient tolerated treatment well Patient left: in chair;with call bell/phone within reach;with chair alarm set     Time: 0830-0900 PT Time Calculation (min) (ACUTE ONLY): 30 min  Charges:  $Gait Training: 8-22 mins $Therapeutic Exercise: 8-22 mins                    G Codes:      Neita Carp, PT, DPT 05/02/2016, 10:23 AM

## 2016-05-03 ENCOUNTER — Telehealth: Payer: Self-pay | Admitting: *Deleted

## 2016-05-03 ENCOUNTER — Encounter: Payer: Self-pay | Admitting: Orthopedic Surgery

## 2016-05-03 NOTE — Telephone Encounter (Signed)
Called patient and left message to return call

## 2016-05-19 ENCOUNTER — Other Ambulatory Visit: Payer: Self-pay | Admitting: Family Medicine

## 2016-05-20 ENCOUNTER — Other Ambulatory Visit: Payer: Self-pay | Admitting: Family Medicine

## 2016-06-16 ENCOUNTER — Other Ambulatory Visit: Payer: Self-pay | Admitting: Family Medicine

## 2016-07-10 ENCOUNTER — Other Ambulatory Visit: Payer: Self-pay | Admitting: Family Medicine

## 2016-07-18 ENCOUNTER — Other Ambulatory Visit: Payer: Self-pay | Admitting: Family Medicine

## 2016-08-18 ENCOUNTER — Ambulatory Visit: Payer: Commercial Managed Care - PPO | Admitting: Family Medicine

## 2016-08-25 ENCOUNTER — Ambulatory Visit: Payer: Commercial Managed Care - PPO | Admitting: Family Medicine

## 2016-09-08 ENCOUNTER — Ambulatory Visit (INDEPENDENT_AMBULATORY_CARE_PROVIDER_SITE_OTHER): Payer: Commercial Managed Care - PPO | Admitting: Family Medicine

## 2016-09-08 ENCOUNTER — Encounter: Payer: Self-pay | Admitting: Family Medicine

## 2016-09-08 VITALS — BP 110/72 | HR 65 | Temp 97.6°F | Ht 68.0 in | Wt 230.0 lb

## 2016-09-08 DIAGNOSIS — E559 Vitamin D deficiency, unspecified: Secondary | ICD-10-CM

## 2016-09-08 DIAGNOSIS — D500 Iron deficiency anemia secondary to blood loss (chronic): Secondary | ICD-10-CM | POA: Diagnosis not present

## 2016-09-08 DIAGNOSIS — E0590 Thyrotoxicosis, unspecified without thyrotoxic crisis or storm: Secondary | ICD-10-CM | POA: Diagnosis not present

## 2016-09-08 DIAGNOSIS — Z79899 Other long term (current) drug therapy: Secondary | ICD-10-CM

## 2016-09-08 DIAGNOSIS — M25571 Pain in right ankle and joints of right foot: Secondary | ICD-10-CM

## 2016-09-08 DIAGNOSIS — G8929 Other chronic pain: Secondary | ICD-10-CM

## 2016-09-08 DIAGNOSIS — M25471 Effusion, right ankle: Secondary | ICD-10-CM | POA: Diagnosis not present

## 2016-09-08 LAB — CBC WITH DIFFERENTIAL/PLATELET
BASOS ABS: 0 10*3/uL (ref 0.0–0.1)
Basophils Relative: 0.6 % (ref 0.0–3.0)
EOS ABS: 0.1 10*3/uL (ref 0.0–0.7)
Eosinophils Relative: 1.2 % (ref 0.0–5.0)
HEMATOCRIT: 37.6 % (ref 36.0–46.0)
Hemoglobin: 12.3 g/dL (ref 12.0–15.0)
LYMPHS ABS: 3.2 10*3/uL (ref 0.7–4.0)
LYMPHS PCT: 48.9 % — AB (ref 12.0–46.0)
MCHC: 32.7 g/dL (ref 30.0–36.0)
MCV: 77.9 fl — ABNORMAL LOW (ref 78.0–100.0)
MONO ABS: 0.6 10*3/uL (ref 0.1–1.0)
Monocytes Relative: 8.7 % (ref 3.0–12.0)
NEUTROS ABS: 2.7 10*3/uL (ref 1.4–7.7)
Neutrophils Relative %: 40.6 % — ABNORMAL LOW (ref 43.0–77.0)
PLATELETS: 251 10*3/uL (ref 150.0–400.0)
RBC: 4.83 Mil/uL (ref 3.87–5.11)
RDW: 15.4 % (ref 11.5–15.5)
WBC: 6.6 10*3/uL (ref 4.0–10.5)

## 2016-09-08 LAB — IBC PANEL
IRON: 44 ug/dL (ref 42–145)
Saturation Ratios: 17.4 % — ABNORMAL LOW (ref 20.0–50.0)
TRANSFERRIN: 181 mg/dL — AB (ref 212.0–360.0)

## 2016-09-08 LAB — TSH: TSH: 1.07 u[IU]/mL (ref 0.35–4.50)

## 2016-09-08 LAB — BASIC METABOLIC PANEL
BUN: 12 mg/dL (ref 6–23)
CALCIUM: 9.5 mg/dL (ref 8.4–10.5)
CO2: 29 meq/L (ref 19–32)
Chloride: 106 mEq/L (ref 96–112)
Creatinine, Ser: 0.77 mg/dL (ref 0.40–1.20)
GFR: 99.08 mL/min (ref >60.00)
Glucose, Bld: 105 mg/dL — ABNORMAL HIGH (ref 70–99)
Potassium: 4.2 mEq/L (ref 3.5–5.1)
Sodium: 141 mEq/L (ref 135–145)

## 2016-09-08 LAB — T4, FREE: Free T4: 0.91 ng/dL (ref 0.60–1.60)

## 2016-09-08 LAB — FERRITIN: FERRITIN: 105.3 ng/mL (ref 10.0–291.0)

## 2016-09-08 LAB — T3, FREE: T3, Free: 4 pg/mL (ref 2.3–4.2)

## 2016-09-08 LAB — VITAMIN D 25 HYDROXY (VIT D DEFICIENCY, FRACTURES): VITD: 36.38 ng/mL (ref 30.00–100.00)

## 2016-09-08 NOTE — Patient Instructions (Signed)

## 2016-09-08 NOTE — Progress Notes (Signed)
Dr. Karleen Hampshire T. Rasul Decola, MD, CAQ Sports Medicine Primary [facility] and Sports Medicine [address] [place], [address] Phone: [phone] Fax: [phone]  09/08/2016  Patient: Annette Romero, MRN: [id number], DOB: 1960-07-19, 56 y.o.  Primary Physician:  Hannah Beat, MD   Chief Complaint  Patient presents with  . Tachycardia    Wants Labs checked  . Ankle Pain    Right-Fractured January 17   Subjective:   Annette Romero is a 56 y.o. very pleasant female patient who presents with the following:  Today - wanted to see podiatry.   Has had rare SVT - schedule toprol with intermittent dilt if needed.   Has had some swelling and pain s/p complex r trimalleolar fx. Would like to see a podiatrist for custom shoe options.   Vit D def H/o anemia and low iron  Historically with history of hyperthyroidism  Past Medical History, Surgical History, Social History, Family History, Problem List, Medications, and Allergies have been reviewed and updated if relevant.  Patient Active Problem List   Diagnosis Date Noted  . Closed right trimalleolar fracture 04/30/2016  . Paroxysmal SVT (supraventricular tachycardia) (HCC) 10/11/2013  . Hyperthyroidism   . Heart murmur   . Vitamin D deficiency   . Hypertension   . GERD (gastroesophageal reflux disease)     Past Medical History:  Diagnosis Date  . GERD (gastroesophageal reflux disease)   . Heart murmur   . Hypertension   . Hyperthyroidism   . Iron deficiency anemia   . Tachycardia    with increased thyroid level  . Vitamin D deficiency     Past Surgical History:  Procedure Laterality Date  . BREAST BIOPSY  1974   breast cyst  . CESAREAN SECTION  1991  . ORIF ANKLE FRACTURE Right 04/30/2016   Procedure: OPEN REDUCTION INTERNAL FIXATION (ORIF) ANKLE FRACTURE;  Surgeon: Kennedy Bucker, MD;  Location: [facility] ORS;  Service: Orthopedics;  Laterality: Right;  Marland Kitchen VAGINAL HYSTERECTOMY  2001   heavy bleeding due to fibroids, not  cancer    Social History   Social History  . Marital status: Married    Spouse name: N/A  . Number of children: 2  . Years of education: N/A   Occupational History  . nurse     LPN at [employer]   Social History Main Topics  . Smoking status: Never Smoker  . Smokeless tobacco: Never Used  . Alcohol use No  . Drug use: No  . Sexual activity: Not on file   Other Topics Concern  . Not on file   Social History Narrative   Nurse at [employer]   Separated   2 children   Son, 46, lives in [place], [address], [place]. [place]    Family History  Problem Relation Age of Onset  . CVA Father   . Heart attack Father   . Heart failure Father   . Seizures Mother   . Drug abuse Brother        d/c from drugs    Allergies  Allergen Reactions  . Zantac [Ranitidine Hcl] Rash    Medication list reviewed and updated in full in [place] [facility].   GEN: No acute illnesses, no fevers, chills. GI: No n/v/d, eating normally Pulm: No SOB Interactive and getting along well at home.  Otherwise, ROS is as per the HPI.  Objective:   BP 110/72   Pulse 65   Temp 97.6 F (36.4 C) (Oral)  Ht 5\' 8"  (1.727 m)   Wt 230 lb (104.3 kg)   BMI 34.97 kg/m   GEN: WDWN, NAD, Non-toxic, A & O x 3 HEENT: Atraumatic, Normocephalic. Neck supple. No masses, No LAD. Ears and Nose: No external deformity. CV: RRR, No M/G/R. No JVD. No thrill. No extra heart sounds. PULM: CTA B, no wheezes, crackles, rhonchi. No retractions. No resp. distress. No accessory muscle use. EXTR: No c/c/e NEURO Normal gait.  PSYCH: Normally interactive. Conversant. Not depressed or anxious appearing.  Calm demeanor.   Laboratory and Imaging Data:  Assessment and Plan:   Chronic pain of right ankle - Plan: Ambulatory referral to Podiatry  Ankle swelling, right - Plan: Ambulatory referral to Podiatry  Hyperthyroidism - Plan: T4, free, T3, free, TSH  Vitamin D deficiency - Plan: VITAMIN D 25 Hydroxy  (Vit-D Deficiency, Fractures)  Anemia, blood loss - Plan: CBC with Differential/Platelet, Ferritin, IBC panel  Encounter for long-term (current) use of medications - Plan: Basic metabolic panel  Check all.  Vit D levels, iron, basic labs  Reasonable for pod input.  Mild swelling likely post-fracture  Follow-up: No Follow-up on file.  Meds ordered this encounter  Medications  . Cholecalciferol (VITAMIN D) 2000 units tablet    Sig: Take 2,000 Units by mouth 2 (two) times daily.   Medications Discontinued During This Encounter  Medication Reason  . oseltamivir (TAMIFLU) 75 MG capsule Completed Course  . oxyCODONE (OXY IR/ROXICODONE) 5 MG immediate release tablet Completed Course  . hydrochlorothiazide (HYDRODIURIL) 12.5 MG tablet    Orders Placed This Encounter  Procedures  . T4, free  . T3, free  . TSH  . Basic metabolic panel  . CBC with Differential/Platelet  . Ferritin  . IBC panel  . VITAMIN D 25 Hydroxy (Vit-D Deficiency, Fractures)  . Ambulatory referral to Podiatry    Signed,  Karleen HampshireSpencer T. Aldona Bryner, MD   Allergies as of 09/08/2016      Reactions   Zantac [ranitidine Hcl] Rash      Medication List       Accurate as of 09/08/16  9:46 AM. Always use your most recent med list.          Cyanocobalamin 1000 MCG/ML Liqd Take 1,000 mg by mouth daily.   diltiazem 120 MG 24 hr capsule Commonly known as:  CARDIZEM CD TAKE 1 CAPSULE (120 MG TOTAL) BY MOUTH DAILY.   KLOR-CON M20 20 MEQ tablet Generic drug:  potassium chloride SA TAKE 1 TABLET BY MOUTH EVERY OTHER DAY   metoprolol tartrate 50 MG tablet Commonly known as:  LOPRESSOR TAKE 1 TABLET (50 MG TOTAL) BY MOUTH 2 (TWO) TIMES DAILY.   MULTIPLE VITAMINS/WOMENS PO Take by mouth daily.   traZODone 150 MG tablet Commonly known as:  DESYREL TAKE 1 TABLET (150 MG TOTAL) BY MOUTH AT BEDTIME AS NEEDED FOR SLEEP.   Vitamin D 2000 units tablet Take 2,000 Units by mouth 2 (two) times daily.

## 2016-10-01 ENCOUNTER — Encounter: Payer: Self-pay | Admitting: Podiatry

## 2016-10-18 NOTE — Progress Notes (Signed)
This encounter was created in error - please disregard.

## 2016-10-22 ENCOUNTER — Ambulatory Visit: Payer: Self-pay | Admitting: Podiatry

## 2016-10-30 ENCOUNTER — Other Ambulatory Visit: Payer: Self-pay | Admitting: Family Medicine

## 2016-11-30 ENCOUNTER — Telehealth: Payer: Self-pay | Admitting: Family Medicine

## 2016-11-30 NOTE — Telephone Encounter (Signed)
PT dropped off form to be signed for employer

## 2016-11-30 NOTE — Telephone Encounter (Signed)
Form completed and placed in Dr. Copland's in box for signature. 

## 2016-12-01 NOTE — Telephone Encounter (Signed)
Patient notified form ready for pick up and no charge.

## 2017-01-24 ENCOUNTER — Other Ambulatory Visit: Payer: Self-pay | Admitting: Family Medicine

## 2017-01-31 ENCOUNTER — Other Ambulatory Visit: Payer: Self-pay | Admitting: Family Medicine

## 2017-03-24 ENCOUNTER — Ambulatory Visit: Payer: Self-pay | Admitting: *Deleted

## 2017-03-24 NOTE — Telephone Encounter (Signed)
Pt states her b/p has been going up and yesterday it was 158/104. She stated that she had doubled up on one of her b/p meds. Also said that she had a headache but she has a hx of migraine headaches. She denied blurred vision at the time.  This morning she has no headache or blurred vision.  Took last b/p meds yesterday. Advised pt not to double up on medications unless the doctor prescribes  her to do this. Don't want her b/p to get too low.   So home care advice given to patient with verbal understanding. Appointment made for b/p and med check.  Reason for Disposition . [1] Systolic BP  >= 140 OR Diastolic >= 90 AND [2] taking BP medications  Answer Assessment - Initial Assessment Questions 1. BLOOD PRESSURE: "What is the blood pressure?" "Did you take at least two measurements 5 minutes apart?"   141/92 HR 94 and 5 minutes later 129/78 and HR 79 2. ONSET: "When did you take your blood pressure?"     This morning 3. HOW: "How did you obtain the blood pressure?" (e.g., visiting nurse, automatic home BP monitor)     Home BP monitor 4. HISTORY: "Do you have a history of high blood pressure?"     yes 5. MEDICATIONS: "Are you taking any medications for blood pressure?" "Have you missed any doses recently?"     Yes have not missed any doses 6. OTHER SYMPTOMS: "Do you have any symptoms?" (e.g., headache, chest pain, blurred vision, difficulty breathing, weakness)     Back of head hurts to the left, feels like something is sitting on her chest ( pressure) 7. PREGNANCY: "Is there any chance you are pregnant?" "When was your last menstrual period?"     no  Protocols used: HIGH BLOOD PRESSURE-A-AH

## 2017-03-24 NOTE — Telephone Encounter (Signed)
Reasonable POC 

## 2017-03-28 ENCOUNTER — Ambulatory Visit: Payer: Commercial Managed Care - PPO | Admitting: Family Medicine

## 2017-03-29 ENCOUNTER — Other Ambulatory Visit: Payer: Self-pay | Admitting: Internal Medicine

## 2017-03-29 DIAGNOSIS — E049 Nontoxic goiter, unspecified: Secondary | ICD-10-CM

## 2017-04-04 ENCOUNTER — Other Ambulatory Visit: Payer: Self-pay | Admitting: Internal Medicine

## 2017-04-08 ENCOUNTER — Ambulatory Visit
Admission: RE | Admit: 2017-04-08 | Discharge: 2017-04-08 | Disposition: A | Discharge status: Home / Self Care | Payer: Commercial Managed Care - PPO | Source: Ambulatory Visit | Attending: Internal Medicine | Admitting: Internal Medicine

## 2017-04-08 DIAGNOSIS — R030 Elevated blood-pressure reading, without diagnosis of hypertension: Secondary | ICD-10-CM | POA: Insufficient documentation

## 2017-04-08 DIAGNOSIS — E049 Nontoxic goiter, unspecified: Secondary | ICD-10-CM

## 2017-04-08 DIAGNOSIS — R000 Tachycardia, unspecified: Secondary | ICD-10-CM | POA: Diagnosis not present

## 2017-04-08 DIAGNOSIS — E0590 Thyrotoxicosis, unspecified without thyrotoxic crisis or storm: Secondary | ICD-10-CM | POA: Insufficient documentation

## 2017-04-08 DIAGNOSIS — Q632 Ectopic kidney: Secondary | ICD-10-CM | POA: Insufficient documentation

## 2017-04-20 ENCOUNTER — Encounter
Admission: RE | Admit: 2017-04-20 | Discharge: 2017-04-20 | Disposition: A | Discharge status: Home / Self Care | Payer: Commercial Managed Care - PPO | Source: Ambulatory Visit | Attending: Orthopedic Surgery | Admitting: Orthopedic Surgery

## 2017-04-20 ENCOUNTER — Other Ambulatory Visit: Payer: Self-pay

## 2017-04-20 DIAGNOSIS — S82851A Displaced trimalleolar fracture of right lower leg, initial encounter for closed fracture: Secondary | ICD-10-CM | POA: Diagnosis not present

## 2017-04-20 DIAGNOSIS — K219 Gastro-esophageal reflux disease without esophagitis: Secondary | ICD-10-CM | POA: Insufficient documentation

## 2017-04-20 DIAGNOSIS — E559 Vitamin D deficiency, unspecified: Secondary | ICD-10-CM | POA: Diagnosis not present

## 2017-04-20 DIAGNOSIS — Z01812 Encounter for preprocedural laboratory examination: Secondary | ICD-10-CM | POA: Insufficient documentation

## 2017-04-20 DIAGNOSIS — R011 Cardiac murmur, unspecified: Secondary | ICD-10-CM | POA: Diagnosis not present

## 2017-04-20 DIAGNOSIS — I10 Essential (primary) hypertension: Secondary | ICD-10-CM | POA: Diagnosis not present

## 2017-04-20 NOTE — Patient Instructions (Addendum)
Your procedure is scheduled on: Thursday, April 28, 2017 Report to Same Day Surgery on the 2nd floor in the [facility]. To find out your arrival time, please call [phone] between 1PM - 3PM on: Wednesday, April 27, 2017  REMEMBER: Instructions that are not followed completely may result in serious medical risk, up to and including death; or upon the discretion of your surgeon and anesthesiologist your surgery may need to be rescheduled.  Do not eat food after midnight the night before your procedure.  No gum chewing or hard candies.  You may however, drink CLEAR liquids up to 2 hours before you are scheduled to arrive at the hospital for your procedure.  Do not drink clear liquids within 2 hours of the start of your surgery.  Clear liquids include: - water  - apple juice without pulp - clear gatorade - black coffee or tea (Do NOT add anything to the coffee or tea) Do NOT drink anything that is not on this list.  No Alcohol for 24 hours before or after surgery.  No Smoking including e-cigarettes for 24 hours prior to surgery. No chewable tobacco products for at least 6 hours prior to surgery. No nicotine patches on the day of surgery.  Notify your doctor if there is any change in your medical condition (cold, fever, infection).  Do not wear jewelry, make-up, hairpins, clips or nail polish.  Do not wear lotions, powders, or perfumes. You may wear deodorant.  Do not shave 48 hours prior to surgery.  Contacts and dentures may not be worn into surgery.  Do not bring valuables to the hospital. [facility] any belongings or valuables.  TAKE THESE MEDICATIONS THE MORNING OF SURGERY WITH A SIP OF WATER:  1.  Diltiazem 2.  lexapro 3.  Metoprolol  Use CHG Soap as directed on instruction sheet.  NOW!  Stop Anti-inflammatories such as MELOXICAM, Aspirin, Advil, Aleve, Ibuprofen, Motrin, Naproxen, Naprosyn, Goodie powder, or aspirin products. (May take  Tylenol or Acetaminophen if needed.)  NOW!  Stop ANY OVER THE COUNTER supplements until after surgery. (May continue Vitamin D, Vitamin B, and multivitamin.)  If you are being discharged the day of surgery, you will not be allowed to drive home. You will need someone to drive you home and stay with you that night.   If you are taking public transportation, you will need to have a responsible adult to with you.  Please call the number above if you have any questions about these instructions.

## 2017-04-25 ENCOUNTER — Other Ambulatory Visit: Payer: Self-pay | Admitting: Family Medicine

## 2017-04-27 MED ORDER — CEFAZOLIN SODIUM-DEXTROSE 2-4 GM/100ML-% IV SOLN
2.0000 g | Freq: Once | INTRAVENOUS | Status: AC
  Administered 2017-04-28: 2 g via INTRAVENOUS

## 2017-04-28 ENCOUNTER — Encounter
Admission: RE | Disposition: A | Discharge status: Home / Self Care | Payer: Self-pay | Source: Ambulatory Visit | Attending: Orthopedic Surgery

## 2017-04-28 ENCOUNTER — Ambulatory Visit
Admission: RE | Admit: 2017-04-28 | Discharge: 2017-04-28 | Disposition: A | Discharge status: Home / Self Care | Payer: Commercial Managed Care - PPO | Source: Ambulatory Visit | Attending: Orthopedic Surgery | Admitting: Orthopedic Surgery

## 2017-04-28 ENCOUNTER — Other Ambulatory Visit: Payer: Self-pay

## 2017-04-28 ENCOUNTER — Encounter: Payer: Self-pay | Admitting: Anesthesiology

## 2017-04-28 ENCOUNTER — Ambulatory Visit: Payer: Commercial Managed Care - PPO | Admitting: Anesthesiology

## 2017-04-28 DIAGNOSIS — Z8639 Personal history of other endocrine, nutritional and metabolic disease: Secondary | ICD-10-CM | POA: Diagnosis not present

## 2017-04-28 DIAGNOSIS — Z7982 Long term (current) use of aspirin: Secondary | ICD-10-CM | POA: Insufficient documentation

## 2017-04-28 DIAGNOSIS — I10 Essential (primary) hypertension: Secondary | ICD-10-CM | POA: Insufficient documentation

## 2017-04-28 DIAGNOSIS — Y831 Surgical operation with implant of artificial internal device as the cause of abnormal reaction of the patient, or of later complication, without mention of misadventure at the time of the procedure: Secondary | ICD-10-CM | POA: Insufficient documentation

## 2017-04-28 DIAGNOSIS — Z79899 Other long term (current) drug therapy: Secondary | ICD-10-CM | POA: Insufficient documentation

## 2017-04-28 DIAGNOSIS — Z7901 Long term (current) use of anticoagulants: Secondary | ICD-10-CM | POA: Insufficient documentation

## 2017-04-28 DIAGNOSIS — K219 Gastro-esophageal reflux disease without esophagitis: Secondary | ICD-10-CM | POA: Insufficient documentation

## 2017-04-28 DIAGNOSIS — E559 Vitamin D deficiency, unspecified: Secondary | ICD-10-CM | POA: Diagnosis not present

## 2017-04-28 DIAGNOSIS — D649 Anemia, unspecified: Secondary | ICD-10-CM | POA: Insufficient documentation

## 2017-04-28 DIAGNOSIS — Z888 Allergy status to other drugs, medicaments and biological substances status: Secondary | ICD-10-CM | POA: Insufficient documentation

## 2017-04-28 DIAGNOSIS — M25571 Pain in right ankle and joints of right foot: Secondary | ICD-10-CM | POA: Diagnosis present

## 2017-04-28 DIAGNOSIS — R011 Cardiac murmur, unspecified: Secondary | ICD-10-CM | POA: Insufficient documentation

## 2017-04-28 DIAGNOSIS — T8484XA Pain due to internal orthopedic prosthetic devices, implants and grafts, initial encounter: Secondary | ICD-10-CM | POA: Insufficient documentation

## 2017-04-28 HISTORY — PX: HARDWARE REMOVAL: SHX979

## 2017-04-28 SURGERY — REMOVAL, HARDWARE
Anesthesia: Choice | Laterality: Right

## 2017-04-28 SURGERY — REMOVAL, HARDWARE
Anesthesia: General | Site: Ankle | Laterality: Right | Wound class: Clean

## 2017-04-28 MED ORDER — MIDAZOLAM HCL 2 MG/2ML IJ SOLN
INTRAMUSCULAR | Status: AC
  Filled 2017-04-28: qty 2

## 2017-04-28 MED ORDER — CEFAZOLIN SODIUM-DEXTROSE 2-4 GM/100ML-% IV SOLN
INTRAVENOUS | Status: AC
  Filled 2017-04-28: qty 100

## 2017-04-28 MED ORDER — LIDOCAINE HCL (CARDIAC) 20 MG/ML IV SOLN
INTRAVENOUS | Status: DC | PRN
  Administered 2017-04-28: 100 mg via INTRAVENOUS

## 2017-04-28 MED ORDER — METOCLOPRAMIDE HCL 5 MG/ML IJ SOLN: 5.0000 mg | Freq: Three times a day (TID) | INTRAMUSCULAR | Status: DC | PRN

## 2017-04-28 MED ORDER — MIDAZOLAM HCL 2 MG/2ML IJ SOLN
INTRAMUSCULAR | Status: DC | PRN
  Administered 2017-04-28 (×2): 1 mg via INTRAVENOUS

## 2017-04-28 MED ORDER — DEXAMETHASONE SODIUM PHOSPHATE 10 MG/ML IJ SOLN
INTRAMUSCULAR | Status: AC
  Filled 2017-04-28: qty 1

## 2017-04-28 MED ORDER — METOCLOPRAMIDE HCL 10 MG PO TABS: 5.0000 mg | ORAL_TABLET | Freq: Three times a day (TID) | ORAL | Status: DC | PRN

## 2017-04-28 MED ORDER — DEXAMETHASONE SODIUM PHOSPHATE 10 MG/ML IJ SOLN
INTRAMUSCULAR | Status: DC | PRN
  Administered 2017-04-28: 10 mg via INTRAVENOUS

## 2017-04-28 MED ORDER — ONDANSETRON HCL 4 MG PO TABS: 4.0000 mg | ORAL_TABLET | Freq: Four times a day (QID) | ORAL | Status: DC | PRN

## 2017-04-28 MED ORDER — ONDANSETRON HCL 4 MG/2ML IJ SOLN: 4.0000 mg | Freq: Four times a day (QID) | INTRAMUSCULAR | Status: DC | PRN

## 2017-04-28 MED ORDER — PROPOFOL 10 MG/ML IV BOLUS
INTRAVENOUS | Status: DC | PRN
  Administered 2017-04-28: 180 mg via INTRAVENOUS

## 2017-04-28 MED ORDER — HYDROCODONE-ACETAMINOPHEN 5-325 MG PO TABS: 1.0000 | ORAL_TABLET | Freq: Four times a day (QID) | ORAL | 0 refills | Status: DC | PRN

## 2017-04-28 MED ORDER — FENTANYL CITRATE (PF) 100 MCG/2ML IJ SOLN
INTRAMUSCULAR | Status: AC
  Administered 2017-04-28: 25 ug via INTRAVENOUS
  Filled 2017-04-28: qty 2

## 2017-04-28 MED ORDER — ONDANSETRON HCL 4 MG/2ML IJ SOLN: 4.0000 mg | Freq: Once | INTRAMUSCULAR | Status: DC | PRN

## 2017-04-28 MED ORDER — SODIUM CHLORIDE 0.9 % IV SOLN: INTRAVENOUS | Status: DC

## 2017-04-28 MED ORDER — LIDOCAINE HCL (PF) 2 % IJ SOLN
INTRAMUSCULAR | Status: AC
  Filled 2017-04-28: qty 10

## 2017-04-28 MED ORDER — ONDANSETRON HCL 4 MG/2ML IJ SOLN
INTRAMUSCULAR | Status: DC | PRN
  Administered 2017-04-28: 4 mg via INTRAVENOUS

## 2017-04-28 MED ORDER — HYDROCODONE-ACETAMINOPHEN 5-325 MG PO TABS: 1.0000 | ORAL_TABLET | ORAL | Status: DC | PRN

## 2017-04-28 MED ORDER — FENTANYL CITRATE (PF) 100 MCG/2ML IJ SOLN
INTRAMUSCULAR | Status: AC
  Filled 2017-04-28: qty 2

## 2017-04-28 MED ORDER — FENTANYL CITRATE (PF) 100 MCG/2ML IJ SOLN
INTRAMUSCULAR | Status: DC | PRN
  Administered 2017-04-28 (×2): 50 ug via INTRAVENOUS

## 2017-04-28 MED ORDER — NEOMYCIN-POLYMYXIN B GU 40-200000 IR SOLN
Status: AC
  Filled 2017-04-28: qty 4

## 2017-04-28 MED ORDER — ACETAMINOPHEN 10 MG/ML IV SOLN
INTRAVENOUS | Status: DC | PRN
  Administered 2017-04-28: 1000 mg via INTRAVENOUS

## 2017-04-28 MED ORDER — NEOMYCIN-POLYMYXIN B GU 40-200000 IR SOLN
Status: DC | PRN
  Administered 2017-04-28: 4 mL

## 2017-04-28 MED ORDER — GLYCOPYRROLATE 0.2 MG/ML IJ SOLN
INTRAMUSCULAR | Status: DC | PRN
  Administered 2017-04-28: 0.2 mg via INTRAVENOUS

## 2017-04-28 MED ORDER — FENTANYL CITRATE (PF) 100 MCG/2ML IJ SOLN
25.0000 ug | INTRAMUSCULAR | Status: DC | PRN
  Administered 2017-04-28 (×4): 25 ug via INTRAVENOUS

## 2017-04-28 MED ORDER — ACETAMINOPHEN 10 MG/ML IV SOLN
INTRAVENOUS | Status: AC
  Filled 2017-04-28: qty 100

## 2017-04-28 MED ORDER — HYDROCODONE-ACETAMINOPHEN 5-325 MG PO TABS
ORAL_TABLET | ORAL | Status: AC
  Filled 2017-04-28: qty 1

## 2017-04-28 MED ORDER — HYDROCODONE-ACETAMINOPHEN 5-325 MG PO TABS
1.0000 | ORAL_TABLET | Freq: Four times a day (QID) | ORAL | Status: DC | PRN
  Administered 2017-04-28: 1 via ORAL

## 2017-04-28 MED ORDER — LACTATED RINGERS IV SOLN
INTRAVENOUS | Status: DC
  Administered 2017-04-28: 13:00:00 via INTRAVENOUS

## 2017-04-28 MED ORDER — PROPOFOL 10 MG/ML IV BOLUS
INTRAVENOUS | Status: AC
  Filled 2017-04-28: qty 20

## 2017-04-28 MED ORDER — ONDANSETRON HCL 4 MG/2ML IJ SOLN
INTRAMUSCULAR | Status: AC
  Filled 2017-04-28: qty 2

## 2017-04-28 SURGICAL SUPPLY — 39 items
BANDAGE ACE 4X5 VEL STRL LF (GAUZE/BANDAGES/DRESSINGS) ×2 IMPLANT
CANISTER SUCT 1200ML W/VALVE (MISCELLANEOUS) ×2 IMPLANT
CHLORAPREP W/TINT 26ML (MISCELLANEOUS) ×2 IMPLANT
CUFF TOURN 24 STER (MISCELLANEOUS) ×2 IMPLANT
DRAPE C-ARM XRAY 36X54 (DRAPES) IMPLANT
DRAPE FLUOR MINI C-ARM 54X84 (DRAPES) ×2 IMPLANT
DRAPE INCISE IOBAN 66X45 STRL (DRAPES) ×2 IMPLANT
DRSG EMULSION OIL 3X8 NADH (GAUZE/BANDAGES/DRESSINGS) ×2 IMPLANT
ELECT CAUTERY BLADE 6.4 (BLADE) ×2 IMPLANT
ELECT REM PT RETURN 9FT ADLT (ELECTROSURGICAL) ×2
ELECTRODE REM PT RTRN 9FT ADLT (ELECTROSURGICAL) ×1 IMPLANT
GAUZE PETRO XEROFOAM 1X8 (MISCELLANEOUS) ×2 IMPLANT
GAUZE SPONGE 4X4 12PLY STRL (GAUZE/BANDAGES/DRESSINGS) ×2 IMPLANT
GLOVE BIOGEL PI IND STRL 7.5 (GLOVE) ×6 IMPLANT
GLOVE BIOGEL PI INDICATOR 7.5 (GLOVE) ×6
GLOVE SURG SYN 9.0  PF PI (GLOVE) ×1
GLOVE SURG SYN 9.0 PF PI (GLOVE) ×1 IMPLANT
GOWN SRG 2XL LVL 4 RGLN SLV (GOWNS) ×1 IMPLANT
GOWN STRL NON-REIN 2XL LVL4 (GOWNS) ×2
GOWN STRL REUS W/ TWL LRG LVL3 (GOWN DISPOSABLE) ×1 IMPLANT
GOWN STRL REUS W/TWL LRG LVL3 (GOWN DISPOSABLE) ×2
KIT RM TURNOVER STRD PROC AR (KITS) ×2 IMPLANT
NEEDLE FILTER BLUNT 18X 1/2SAF (NEEDLE) ×1
NEEDLE FILTER BLUNT 18X1 1/2 (NEEDLE) ×1 IMPLANT
NS IRRIG 1000ML POUR BTL (IV SOLUTION) ×2 IMPLANT
PACK EXTREMITY ARMC (MISCELLANEOUS) ×2 IMPLANT
PAD ABD DERMACEA PRESS 5X9 (GAUZE/BANDAGES/DRESSINGS) ×4 IMPLANT
PADDING CAST 4IN STRL (MISCELLANEOUS) ×1
PADDING CAST BLEND 4X4 STRL (MISCELLANEOUS) ×1 IMPLANT
STAPLER SKIN PROX 35W (STAPLE) ×2 IMPLANT
SUT ETHIBOND NAB CT1 #1 30IN (SUTURE) ×2 IMPLANT
SUT ETHILON 3-0 FS-10 30 BLK (SUTURE) ×2
SUT VIC AB 0 CT1 36 (SUTURE) ×2 IMPLANT
SUT VIC AB 2-0 CT1 27 (SUTURE) ×1
SUT VIC AB 2-0 CT1 TAPERPNT 27 (SUTURE) ×1 IMPLANT
SUTURE EHLN 3-0 FS-10 30 BLK (SUTURE) ×1 IMPLANT
SYR 10ML 18GX1 1/2 (NEEDLE) ×2 IMPLANT
SYR 10ML LL (SYRINGE) ×2 IMPLANT
WATER STERILE IRR 1000ML POUR (IV SOLUTION) IMPLANT

## 2017-04-28 NOTE — Transfer of Care (Signed)
Immediate Anesthesia Transfer of Care Note  Patient: Annette Romero  Procedure(s) Performed: HARDWARE REMOVAL-RIGHT ANKLE (Right Ankle)  Patient Location: PACU  Anesthesia Type:General  Level of Consciousness: sedated  Airway & Oxygen Therapy: Patient Spontanous Breathing and Patient connected to face mask oxygen  Post-op Assessment: Report given to RN and Post -op Vital signs reviewed and stable  Post vital signs: Reviewed and stable  Last Vitals:  Vitals:   04/28/17 1242 04/28/17 1610  BP: 124/80 (!) 131/54  Pulse: (!) 57 (!) 59  Resp: 16 20  Temp: 36.7 C (!) 36.1 C  SpO2: 100% 100%    Last Pain:  Vitals:   04/28/17 1610  TempSrc:   PainSc: Asleep         Complications: No apparent anesthesia complications

## 2017-04-28 NOTE — Anesthesia Preprocedure Evaluation (Signed)
Anesthesia Evaluation  Patient identified by MRN, date of birth, ID band Patient awake    Reviewed: Allergy & Precautions, NPO status , Patient's Chart, lab work & pertinent test results, reviewed documented beta blocker date and time   Airway Mallampati: II  TM Distance: >3 FB     Dental  (+) Chipped   Pulmonary neg pulmonary ROS,           Cardiovascular hypertension, Pt. on medications and Pt. on home beta blockers + Valvular Problems/Murmurs      Neuro/Psych negative neurological ROS  negative psych ROS   GI/Hepatic GERD  Controlled,  Endo/Other  Hyperthyroidism   Renal/GU   negative genitourinary   Musculoskeletal   Abdominal   Peds negative pediatric ROS (+)  Hematology  (+) anemia ,   Anesthesia Other Findings Does not take blood thinners of any kind. Slight fever this am. No systemic signs. Pt feels well except for dry throat. No problems with SVT for the last several months.  Reproductive/Obstetrics                             Anesthesia Physical  Anesthesia Plan  ASA: III  Anesthesia Plan: General   Post-op Pain Management:    Induction:   PONV Risk Score and Plan:   Airway Management Planned: Oral ETT  Additional Equipment:   Intra-op Plan:   Post-operative Plan: Extubation in OR  Informed Consent: I have reviewed the patients History and Physical, chart, labs and discussed the procedure including the risks, benefits and alternatives for the proposed anesthesia with the patient or authorized representative who has indicated his/her understanding and acceptance.   Dental advisory given  Plan Discussed with: CRNA and Surgeon  Anesthesia Plan Comments:         Anesthesia Quick Evaluation

## 2017-04-28 NOTE — Discharge Instructions (Addendum)
Keep dressing clean and dry. Elevate leg as much as possible this weekend, but ok to put full weight when walking. Pain meds as directed       AMBULATORY SURGERY  DISCHARGE INSTRUCTIONS   1) The drugs that you were given will stay in your system until tomorrow so for the next 24 hours you should not:  A) Drive an automobile B) Make any legal decisions C) Drink any alcoholic beverage   2) You may resume regular meals tomorrow.  Today it is better to start with liquids and gradually work up to solid foods.  You may eat anything you prefer, but it is better to start with liquids, then soup and crackers, and gradually work up to solid foods.   3) Please notify your doctor immediately if you have any unusual bleeding, trouble breathing, redness and pain at the surgery site, drainage, fever, or pain not relieved by medication.    4) Additional Instructions:        Please contact your physician with any problems or Same Day Surgery at [phone], Monday through Friday 6 am to 4 pm, or [place] [facility] Main number at [phone].

## 2017-04-28 NOTE — Op Note (Signed)
04/28/2017  4:10 PM  PATIENT:  Mikel CellaRebecca Sickman  57 y.o. female  PRE-OPERATIVE DIAGNOSIS:  Right ankle pain secondary to painful hardware  POST-OPERATIVE DIAGNOSIS:  Right ankle pain   PROCEDURE:  Procedure(s): HARDWARE REMOVAL-RIGHT ANKLE (Right)  SURGEON: Leitha SchullerMichael J Isadore Palecek, MD  ASSISTANTS: none  ANESTHESIA:   general  EBL:  Total I/O In: 600 [I.V.:600] Out: 0   BLOOD ADMINISTERED:none  DRAINS: none   LOCAL MEDICATIONS USED:  NONE  SPECIMEN:  No Specimen  DISPOSITION OF SPECIMEN:  N/A  COUNTS:  YES  TOURNIQUET:  * Missing tourniquet times found for documented tourniquets in log: [id number] *26 min at 275  IMPLANTS: none  DICTATION: .Dragon Dictation atient brought the operating room and after adequate anesthesia was obtained leg was prepped and draped in sterile fashion. After timeout procedures we the tourniquet was raised and a prior incisions were opened on the lateral ankle the previous proximal screws were placed percutaneously incision these incisions needed to be connected to allow posure to find the screw heads after this is done all screw heads were removed with screws intact and the plate was removed laterally a small incision was made medially going through the old scar and the hemostat used to separate the underlying tissue.i C-arm and the screw removed without difficulty. The wounds were irrigated and then closed with 2-0 Vicryl subcutaneous and skin staples followed by Xeroform 4 x 4's web roll and Ace wrap  PLAN OF CARE: Discharge to home after PACU  PATIENT DISPOSITION:  PACU - hemodynamically stable.

## 2017-04-28 NOTE — Anesthesia Procedure Notes (Signed)
Procedure Name: LMA Insertion Date/Time: 04/28/2017 3:12 PM Performed by: Ginger CarneMichelet, Zaniya Mcaulay, CRNA Pre-anesthesia Checklist: Patient identified, Emergency Drugs available, Suction available, Patient being monitored and Timeout performed Patient Re-evaluated:Patient Re-evaluated prior to induction Oxygen Delivery Method: Circle system utilized Preoxygenation: Pre-oxygenation with 100% oxygen Induction Type: IV induction LMA: LMA inserted LMA Size: 4.5 Tube type: Oral Number of attempts: 1 Placement Confirmation: positive ETCO2 and breath sounds checked- equal and bilateral Tube secured with: Tape Dental Injury: Teeth and Oropharynx as per pre-operative assessment

## 2017-04-28 NOTE — Anesthesia Post-op Follow-up Note (Signed)
Anesthesia QCDR form completed.        

## 2017-04-28 NOTE — OR Nursing (Signed)
Explanted 6 screws and 1 plate from right ankle. Explanted placed in biohazard bin per MD.

## 2017-04-28 NOTE — H&P (Signed)
Reviewed paper H+P, will be scanned into chart. No changes noted.  

## 2017-04-29 ENCOUNTER — Encounter: Payer: Self-pay | Admitting: Orthopedic Surgery

## 2017-05-02 NOTE — Anesthesia Postprocedure Evaluation (Signed)
Anesthesia Post Note  Patient: Mikel CellaRebecca Hemmerich  Procedure(s) Performed: HARDWARE REMOVAL-RIGHT ANKLE (Right Ankle)  Patient location during evaluation: PACU Anesthesia Type: General Level of consciousness: awake and alert and oriented Pain management: pain level controlled Vital Signs Assessment: post-procedure vital signs reviewed and stable Respiratory status: spontaneous breathing Cardiovascular status: blood pressure returned to baseline Anesthetic complications: no     Last Vitals:  Vitals:   04/28/17 1715 04/28/17 1736  BP: 113/72 109/74  Pulse: (!) 52 (!) 52  Resp: 18 18  Temp: 36.6 C   SpO2: 100% 100%    Last Pain:  Vitals:   04/28/17 1758  TempSrc:   PainSc: 3                  Thurlow Gallaga

## 2017-05-19 ENCOUNTER — Other Ambulatory Visit: Payer: Self-pay | Admitting: *Deleted

## 2017-05-19 MED ORDER — DILTIAZEM HCL ER COATED BEADS 120 MG PO CP24: ORAL_CAPSULE | ORAL | 0 refills | Status: DC

## 2017-06-10 ENCOUNTER — Other Ambulatory Visit: Payer: Self-pay | Admitting: Internal Medicine

## 2017-06-10 DIAGNOSIS — Z1231 Encounter for screening mammogram for malignant neoplasm of breast: Secondary | ICD-10-CM

## 2017-06-22 ENCOUNTER — Ambulatory Visit
Admission: RE | Admit: 2017-06-22 | Discharge: 2017-06-22 | Disposition: A | Discharge status: Home / Self Care | Payer: Commercial Managed Care - PPO | Source: Ambulatory Visit | Attending: Internal Medicine | Admitting: Internal Medicine

## 2017-06-22 DIAGNOSIS — Z1231 Encounter for screening mammogram for malignant neoplasm of breast: Secondary | ICD-10-CM | POA: Diagnosis present

## 2017-06-27 ENCOUNTER — Encounter: Payer: Self-pay | Admitting: *Deleted

## 2017-06-27 NOTE — Telephone Encounter (Signed)
This encounter was created in error - please disregard.

## 2017-06-29 ENCOUNTER — Other Ambulatory Visit: Payer: Self-pay | Admitting: Family Medicine

## 2017-07-17 ENCOUNTER — Other Ambulatory Visit: Payer: Self-pay | Admitting: Family Medicine

## 2017-07-18 ENCOUNTER — Other Ambulatory Visit: Payer: Self-pay | Admitting: *Deleted

## 2017-07-18 ENCOUNTER — Telehealth: Payer: Self-pay | Admitting: Internal Medicine

## 2017-07-18 NOTE — Telephone Encounter (Signed)
Annette Romero See below message    Copied from CRM [phone]. Topic: Quick Communication - Office Called Patient >> Jul 18, 2017 10:21 AM Annette Romero, Annette Romero wrote: Reason for CRM: left message asking pt to call office/  please schedule cpx with labs prior with dr copland >> Jul 18, 2017 10:36 AM Eston Mouldavis, Annette Romero wrote: Pt returned Annette Romero call-  She states she has changed PCP's so did not schedule apt.

## 2017-12-30 ENCOUNTER — Ambulatory Visit
Admission: RE | Admit: 2017-12-30 | Discharge: 2017-12-30 | Disposition: A | Discharge status: Home / Self Care | Payer: Commercial Managed Care - PPO | Source: Ambulatory Visit | Attending: Family | Admitting: Family

## 2017-12-30 ENCOUNTER — Other Ambulatory Visit: Payer: Self-pay | Admitting: Family

## 2017-12-30 DIAGNOSIS — M79604 Pain in right leg: Secondary | ICD-10-CM

## 2017-12-30 DIAGNOSIS — M25461 Effusion, right knee: Secondary | ICD-10-CM | POA: Insufficient documentation

## 2017-12-30 DIAGNOSIS — R29898 Other symptoms and signs involving the musculoskeletal system: Secondary | ICD-10-CM | POA: Diagnosis not present

## 2018-06-17 NOTE — Progress Notes (Signed)
Dr. Karleen Hampshire T. Tinesha Siegrist, MD, CAQ Sports Medicine [facility] and Sports [facility] [address] [place], [address] Phone: [phone] Fax: [phone]  06/19/2018  Patient: Annette Romero, MRN: [id number], DOB: Mar 01, 1961, 58 y.o.  Primary Physician:  Hannah Beat, MD   Chief Complaint  Patient presents with  . Leg Pain    with swelling   Subjective:   Annette Romero is a 58 y.o. very pleasant female patient who presents with the following:  Pleasant patient known well who I uses to see for general medical issues is here now with pain in her legs a friend recommended that she see Dr. Yves Dill for a while, but she would like to return to our practice now.  S/p trimalleolar fx with ORIF in 2018, still has some swelling  Changed BP meds, started to see Dr. Welton Flakes and has been going to see Dr. Welton Flakes the cardiologist.  She was told heart was fine, but then had pulse > 200 for extended period of time requiring Adenosine conversion in the office.  Back on Lopressor and Norvasc.   Heart was racing and went to cardiologists office. Saw Dr. Milta Deiters PA.  212 at her office.  170 in their office. Gave some Adenosine in the office.  Then went into the 70's and left AMA.  Dr. Welton Flakes watched her and placed on Sotalol. She has not taken this and thought it was a beta-blocker.   Saw Dossie Arbour back in 05/2014  Get all results from both Dr. Milta Deiters.  She also had a leg pain, neg [place] for DVT with a baker's cyst s/p asp and injection x 1  Past Medical History, Surgical History, Social History, Family History, Problem List, Medications, and Allergies have been reviewed and updated if relevant.  Patient Active Problem List   Diagnosis Date Noted  . Closed right trimalleolar fracture 04/30/2016  . Paroxysmal SVT (supraventricular tachycardia) (HCC) 10/11/2013  . Hyperthyroidism   . Heart murmur   . Vitamin D deficiency   . Hypertension   . GERD (gastroesophageal reflux disease)     Past  Medical History:  Diagnosis Date  . GERD (gastroesophageal reflux disease)   . Heart murmur   . Hypertension   . Hyperthyroidism   . Iron deficiency anemia   . Tachycardia    with increased thyroid level  . Vitamin D deficiency     Past Surgical History:  Procedure Laterality Date  . ABDOMINAL HYSTERECTOMY    . BREAST BIOPSY  1974   breast cyst  . BREAST CYST ASPIRATION    . BREAST EXCISIONAL BIOPSY Left    at age of 36  . CESAREAN SECTION  1991  . HARDWARE REMOVAL Right 04/28/2017   Procedure: HARDWARE REMOVAL-RIGHT ANKLE;  Surgeon: Kennedy Bucker, MD;  Location: [facility] ORS;  Service: Orthopedics;  Laterality: Right;  . ORIF ANKLE FRACTURE Right 04/30/2016   Procedure: OPEN REDUCTION INTERNAL FIXATION (ORIF) ANKLE FRACTURE;  Surgeon: Kennedy Bucker, MD;  Location: [facility] ORS;  Service: Orthopedics;  Laterality: Right;  Marland Kitchen VAGINAL HYSTERECTOMY  2001   heavy bleeding due to fibroids, not cancer    Social History   Socioeconomic History  . Marital status: Married    Spouse name: Not on file  . Number of children: 2  . Years of education: Not on file  . Highest education level: Not on file  Occupational History  . Occupation: Engineer, civil (consulting)    Comment: LPN at [employer]  . Physicist, medical  strain: Not on file  . Food insecurity:    Worry: Not on file    Inability: Not on file  . Transportation needs:    Medical: Not on file    Non-medical: Not on file  Tobacco Use  . Smoking status: Never Smoker  . Smokeless tobacco: Never Used  Substance and Sexual Activity  . Alcohol use: No  . Drug use: No  . Sexual activity: Not on file  Lifestyle  . Physical activity:    Days per week: Not on file    Minutes per session: Not on file  . Stress: Not on file  Relationships  . Social connections:    Talks on phone: Not on file    Gets together: Not on file    Attends religious service: Not on file    Active member of club or organization: Not on file    Attends meetings of  clubs or organizations: Not on file    Relationship status: Not on file  . Intimate partner violence:    Fear of current or ex partner: Not on file    Emotionally abused: Not on file    Physically abused: Not on file    Forced sexual activity: Not on file  Other Topics Concern  . Not on file  Social History Narrative   Nurse at [employer]   Separated   2 children   Son, 25, lives in [place], [address], [place]. [place]    Family History  Problem Relation Age of Onset  . CVA Father   . Heart attack Father   . Heart failure Father   . Seizures Mother   . Drug abuse Brother        d/c from drugs  . Breast cancer Neg Hx     Allergies  Allergen Reactions  . Zantac [Ranitidine Hcl] Rash    Medication list reviewed and updated in full in [facility].  GEN: No fevers, chills. Nontoxic. Primarily MSK c/o today. MSK: Detailed in the HPI GI: tolerating PO intake without difficulty Neuro: No numbness, parasthesias, or tingling associated. Otherwise the pertinent positives of the ROS are noted above.   Objective:   BP 100/70   Pulse 73   Temp (!) 97.4 F (36.3 C) (Oral)   Ht  (1.727 m)   Wt 239 lb 8 oz (108.6 kg)   BMI 36.42 kg/m    GEN: WDWN, NAD, Non-toxic, A & O x 3 HEENT: Atraumatic, Normocephalic. Neck supple. No masses, No LAD. Ears and Nose: No external deformity. CV: RRR, No M/G/R. No JVD. No thrill. No extra heart sounds. PULM: CTA B, no wheezes, crackles, rhonchi. No retractions. No resp. distress. No accessory muscle use. EXTR: No c/c/e NEURO Normal gait.  PSYCH: Normally interactive. Conversant. Not depressed or anxious appearing.  Calm demeanor.    Radiology: US Venous Img Lower Unilateral Right  Result Date: 12/30/2017 CLINICAL DATA:  Right lower extremity pain. EXAM: RIGHT LOWER EXTREMITY VENOUS DOPPLER ULTRASOUND TECHNIQUE: Gray-scale sonography with graded compression, as well as color Doppler and duplex ultrasound were performed to  evaluate the lower extremity deep venous systems from the level of the common femoral vein and including the common femoral, femoral, profunda femoral, popliteal and calf veins including the posterior tibial, peroneal and gastrocnemius veins when visible. The superficial great saphenous vein was also interrogated. Spectral Doppler was utilized to evaluate flow at rest and with distal augmentation maneuvers in the common femoral, femoral and popliteal  veins. COMPARISON:  None. FINDINGS: Contralateral Common Femoral Vein: Respiratory phasicity is normal and symmetric with the symptomatic side. No evidence of thrombus. Normal compressibility. Common Femoral Vein: No evidence of thrombus. Normal compressibility, respiratory phasicity and response to augmentation. Saphenofemoral Junction: No evidence of thrombus. Normal compressibility and flow on color Doppler imaging. Profunda Femoral Vein: No evidence of thrombus. Normal compressibility and flow on color Doppler imaging. Femoral Vein: No evidence of thrombus. Normal compressibility, respiratory phasicity and response to augmentation. Popliteal Vein: No evidence of thrombus. Normal compressibility, respiratory phasicity and response to augmentation. Calf Veins: No evidence of thrombus. Normal compressibility and flow on color Doppler imaging. Superficial Great Saphenous Vein: No evidence of thrombus. Normal compressibility. Venous Reflux:  None. Other Findings: There is a mildly complex 2.3 x 0.6 x 1.7 cm fluid collection in the posterior right knee soft tissues without internal vascularity on color Doppler, correlating with the area of symptomatic concern as indicated by the patient. IMPRESSION: No evidence of deep venous thrombosis in the right lower extremity. Mildly complex 2.3 x 0.6 x 1.7 cm fluid collection in the posterior right knee soft tissues, correlating with the area of symptomatic concern as indicated by the patient. Finding could represent a Baker's  cyst, posttraumatic collection such as from plantaris or popliteus tendon injury or other nonspecific collection. Electronically Signed   By: Delbert Phenix M.D.   On: 12/30/2017 16:01   Assessment and Plan:   Cardiac arrhythmia, unspecified cardiac arrhythmia type  Tachycardia - Plan: Ambulatory referral to Cardiology  Essential hypertension - Plan: Ambulatory referral to Cardiology  Persistent pulse > 200 requiring Adenosine conversion, which was done in Dr. Milta Deiters office without hospital follow-up.  Reportedly placed on Sotalol, but she has not taken it.  My concern is that she has a potentially serious arrhythmia that needs definitive management.  My recommendation is that she see one of the outstanding [facility] Cardiologists for their opinion regarding management, meds, ? EP involvement. She understands and agrees.  BP stable. Stress test and echo reportedly normal.  We will obtain records from the patient's prior physicians.   Baker's cyst can wait - ice, nsaids, tylenol for now  Follow-up: Cardiology  Orders Placed This Encounter  Procedures  . Ambulatory referral to Cardiology    Signed,  Karleen Hampshire T. Juelz Whittenberg, MD   Outpatient Encounter Medications as of 06/19/2018  Medication Sig  . amLODipine (NORVASC) 10 MG tablet Take 10 mg by mouth daily.   Marland Kitchen aspirin EC 81 MG tablet Take 81 mg by mouth daily.  . baclofen (LIORESAL) 10 MG tablet Take 10 mg by mouth at bedtime.  . Calcium Carb-Cholecalciferol (CALCIUM-VITAMIN D3) 600-400 MG-UNIT CAPS Take 1 capsule by mouth daily.  . ferrous sulfate 325 (65 FE) MG tablet Take 325 mg by mouth 2 (two) times daily with a meal.  . meloxicam (MOBIC) 15 MG tablet Take 15 mg by mouth daily.  . metoprolol (LOPRESSOR) 50 MG tablet TAKE 1 TABLET (50 MG TOTAL) BY MOUTH 2 (TWO) TIMES DAILY.  . rosuvastatin (CRESTOR) 10 MG tablet Take 10 mg by mouth at bedtime.  . SUMAtriptan (IMITREX) 50 MG tablet Take 50 mg by mouth every 2 (two) hours as needed for  migraine or headache.   . traZODone (DESYREL) 50 MG tablet TAKE 1 TABLET BY MOUTH EVERYDAY AT BEDTIME  . vitamin E 1000 UNIT capsule Take 2,000 Units by mouth daily.  . [DISCONTINUED] traZODone (DESYREL) 150 MG tablet TAKE 1 TABLET (150 MG TOTAL) BY MOUTH AT  BEDTIME AS NEEDED FOR SLEEP.  . [DISCONTINUED] diltiazem (CARDIZEM CD) 120 MG 24 hr capsule TAKE 1 CAPSULE BY MOUTH EVERY DAY  . [DISCONTINUED] diltiazem (CARDIZEM) 30 MG tablet Take 30 mg by mouth 4 (four) times daily as needed (for heart racing).  . [DISCONTINUED] escitalopram (LEXAPRO) 10 MG tablet Take 10 mg by mouth daily.  . [DISCONTINUED] HYDROcodone-acetaminophen (NORCO) 5-325 MG tablet Take 1 tablet by mouth every 6 (six) hours as needed for moderate pain.  . [DISCONTINUED] KLOR-CON M20 20 MEQ tablet TAKE 1 TABLET BY MOUTH EVERY OTHER DAY  . [DISCONTINUED] pantoprazole (PROTONIX) 40 MG tablet Take by mouth.   No facility-administered encounter medications on file as of 06/19/2018.

## 2018-06-19 ENCOUNTER — Encounter: Payer: Self-pay | Admitting: Family Medicine

## 2018-06-19 ENCOUNTER — Ambulatory Visit: Payer: Commercial Managed Care - PPO | Admitting: Family Medicine

## 2018-06-19 ENCOUNTER — Telehealth: Payer: Self-pay | Admitting: Internal Medicine

## 2018-06-19 VITALS — BP 100/70 | HR 73 | Temp 97.4°F | Ht 68.0 in | Wt 239.5 lb

## 2018-06-19 DIAGNOSIS — I10 Essential (primary) hypertension: Secondary | ICD-10-CM

## 2018-06-19 DIAGNOSIS — I499 Cardiac arrhythmia, unspecified: Secondary | ICD-10-CM

## 2018-06-19 DIAGNOSIS — R000 Tachycardia, unspecified: Secondary | ICD-10-CM | POA: Diagnosis not present

## 2018-06-19 NOTE — Patient Instructions (Signed)
REFERRALS TO SPECIALISTS, SPECIAL TESTS (MRI, CT, ULTRASOUNDS)  MARION or  Anastasiya will help you. ASK CHECK-IN FOR HELP.  Specialist appointment times vary a great deal, based on their schedule / openings. -- Some specialists have very long wait times. (Example. Dermatology)    

## 2018-06-19 NOTE — Telephone Encounter (Signed)
Left message asking pt to call office It looks like Patient PCP is Neelam Welton Flakes if correct dr copland can see her for leg pain but not for refill  Per donna

## 2018-06-28 ENCOUNTER — Telehealth: Payer: Self-pay

## 2018-06-28 NOTE — Telephone Encounter (Signed)
Pt left v/m trazodone 50 mg for sleep is not effective and pt request to change back to trazodone 150 mg that pt had previously taken. Pt request cb. Pt last seen 06/19/18 to return to Dr Patsy Lager as PCP.

## 2018-06-29 MED ORDER — TRAZODONE HCL 150 MG PO TABS: 150.0000 mg | ORAL_TABLET | Freq: Every evening | ORAL | 5 refills | Status: DC | PRN

## 2018-06-29 NOTE — Telephone Encounter (Signed)
done

## 2018-07-23 ENCOUNTER — Other Ambulatory Visit: Payer: Self-pay | Admitting: Family Medicine

## 2018-08-11 ENCOUNTER — Telehealth: Payer: Self-pay

## 2018-08-11 NOTE — Telephone Encounter (Signed)
Pt left v/m requesting cb;Norvasc does not seem to be working and pt wants to go back to HCTZ for swelling in both legs. I tried to call pt and left v/m for pt to cb [phone] and a nurse will triage call for her.FYI to [facility] CMA.

## 2018-08-14 ENCOUNTER — Other Ambulatory Visit: Payer: Self-pay | Admitting: Family Medicine

## 2018-08-14 MED ORDER — HYDROCHLOROTHIAZIDE 12.5 MG PO CAPS: 12.5000 mg | ORAL_CAPSULE | Freq: Every day | ORAL | 3 refills | Status: DC

## 2018-08-14 MED ORDER — METOPROLOL TARTRATE 100 MG PO TABS: 100.0000 mg | ORAL_TABLET | Freq: Two times a day (BID) | ORAL | 2 refills | Status: DC

## 2018-08-14 NOTE — Addendum Note (Signed)
Addended by: Hannah Beat on: 08/14/2018 04:07 PM   Modules accepted: Orders

## 2018-08-14 NOTE — Telephone Encounter (Signed)
Left message for Ms. Cassel to stop the Norvasc.  Dr. Patsy Lager has sent in a prescription for HCTZ 12.5 mg to [facility] [place].  Medication list updated.  I ask that the patient call me back if she has any questions.

## 2018-08-14 NOTE — Telephone Encounter (Addendum)
Spoke with patient and reviewed all instructions.  She verbalizes understanding and will keep [place] updated as needed until seeing cardiology.  She did already take 1 dose of the hydrochlorothiazide but knows to stop it IMMEDIATELY and wait until tomorrow to increase her Lopressor.   She thanks everyone involved.   FYI to Dr. Patsy Lager.

## 2018-08-14 NOTE — Telephone Encounter (Signed)
Ok to d/c norvasc  HCTZ 12.5 mg, 1 po daily Electronically prescribe to the pharmacy of their choice.Call in #30, 11 refills. OR if they prefer a 90 day supply, #90 with 3 refills is OK, too Prescription instructions above

## 2018-08-14 NOTE — Telephone Encounter (Signed)
The Covid-19 outbreak has altered the care nationally, and I would have liked for her have been able to see cardiology much sooner.   SHE SHOULD NOT TAKE HER NEW HYDROCHLOROTHIAZIDE. STOP IT.  I am going to double her lopressor dose to 100 mg twice a day to help with the tachycardia.  She should only be taking this for BP and until she sees Cardiology.  Check her pulse once a day, particularly if she feels faint or lightheaded.

## 2018-08-14 NOTE — Telephone Encounter (Signed)
Patient stated that when she first called her heart rate was 204 and now it is down to 99 and blood pressure is 143/94.  Patient stated that she has a headache and feels jittery.  Patient stated that she just got the message that was left earlier for her today. Patient stated that her son is on the way to get her new prescription now and will bring it to her at work. Patient stated that she just got to work. Patient stated that the [facility] that she just had lasted longer than they usually do and that is why she was so concerned. Patient stated hat her heart rate is now down to 90 and oxygen level is 98%. Patient stated she can be reached at the nurses station at [facility] number [phone]

## 2018-08-14 NOTE — Progress Notes (Signed)
Lopressor increase to 100 mg BID  D/c hctz and norvasc  We have been trying to get her in to cardiology as quickly as we can.  09/2018 appt

## 2018-09-08 ENCOUNTER — Telehealth: Payer: Self-pay

## 2018-09-08 NOTE — Telephone Encounter (Signed)
Left voicemail message requesting for patient to call back to discuss switching face to face visit with Dr. Kirke Corin to a video visit on 09/15/2018 due to current clinic policies related to COVID 19 precautions.

## 2018-09-15 ENCOUNTER — Ambulatory Visit: Payer: Commercial Managed Care - PPO | Admitting: Cardiovascular Disease

## 2018-09-26 ENCOUNTER — Ambulatory Visit: Payer: Commercial Managed Care - PPO | Admitting: Cardiovascular Disease

## 2018-12-21 IMAGING — MG MM DIGITAL SCREENING BILAT W/ CAD
5 series · 5 of 5 positions shown · non-contrast
Comparison: Previous exam(s).

CLINICAL DATA: Screening.

EXAM:
DIGITAL SCREENING BILATERAL MAMMOGRAM WITH CAD

[L MLO (1 of 2)]
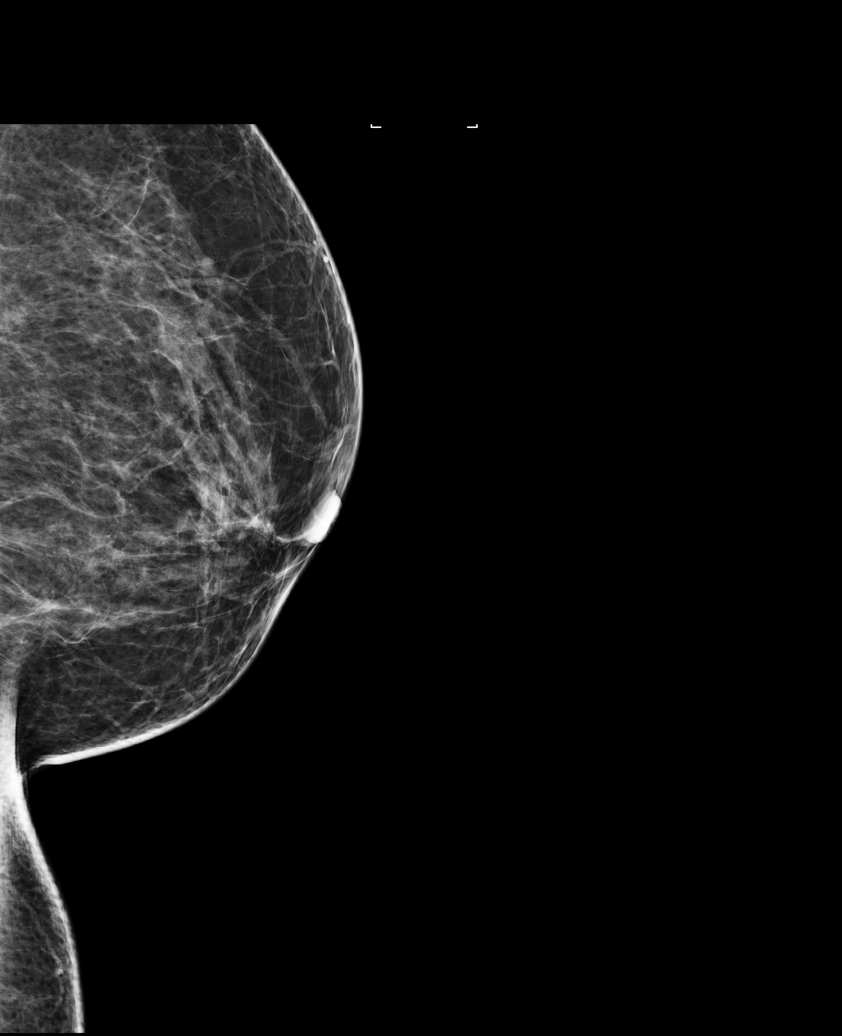

[L CC]
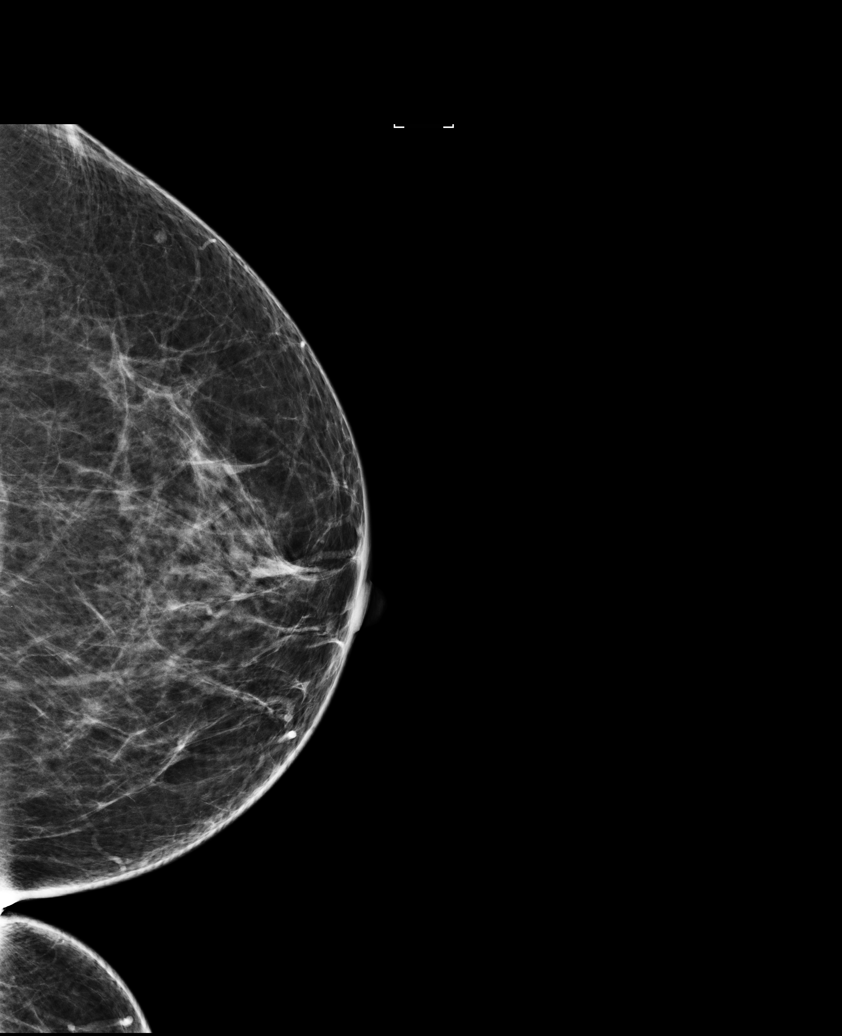

[L MLO (2 of 2)]
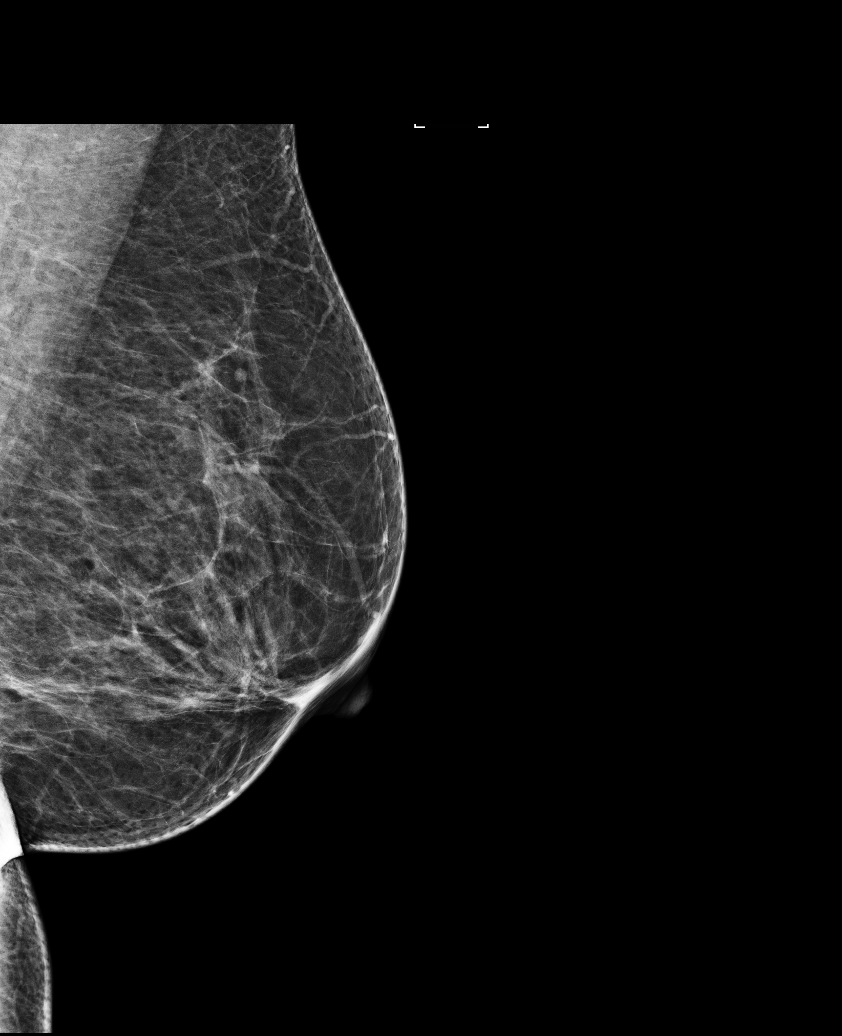

[R MLO]
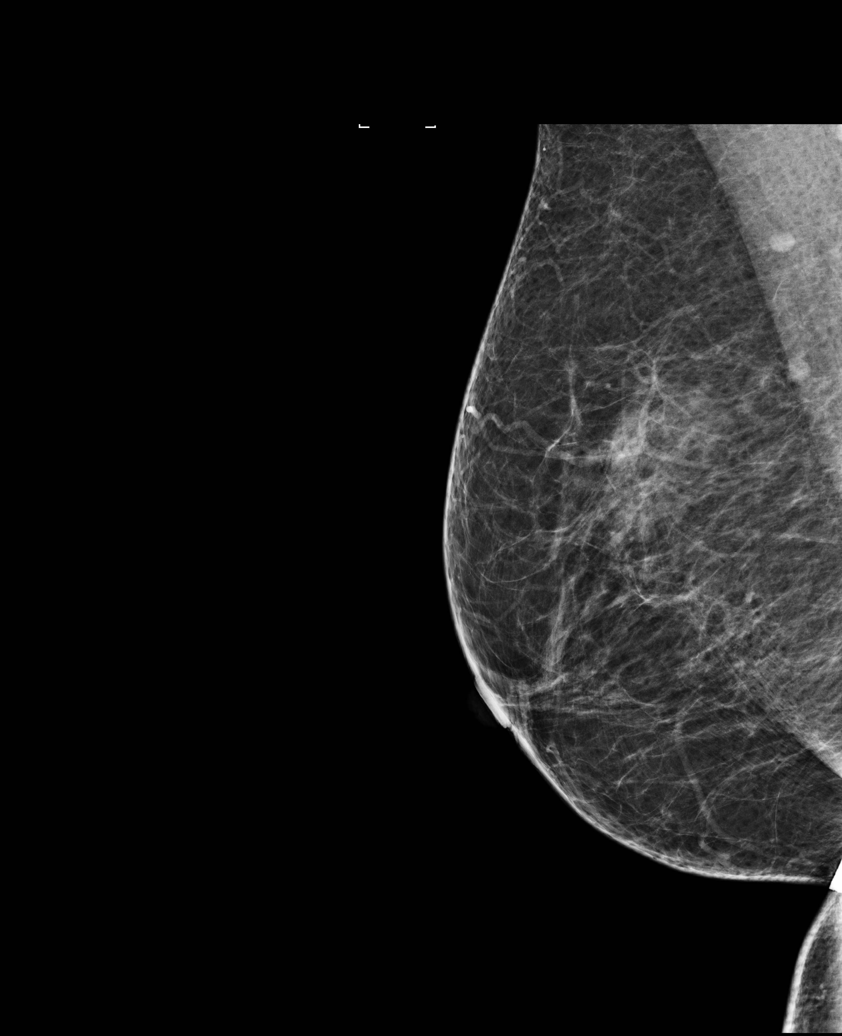

[R CC]
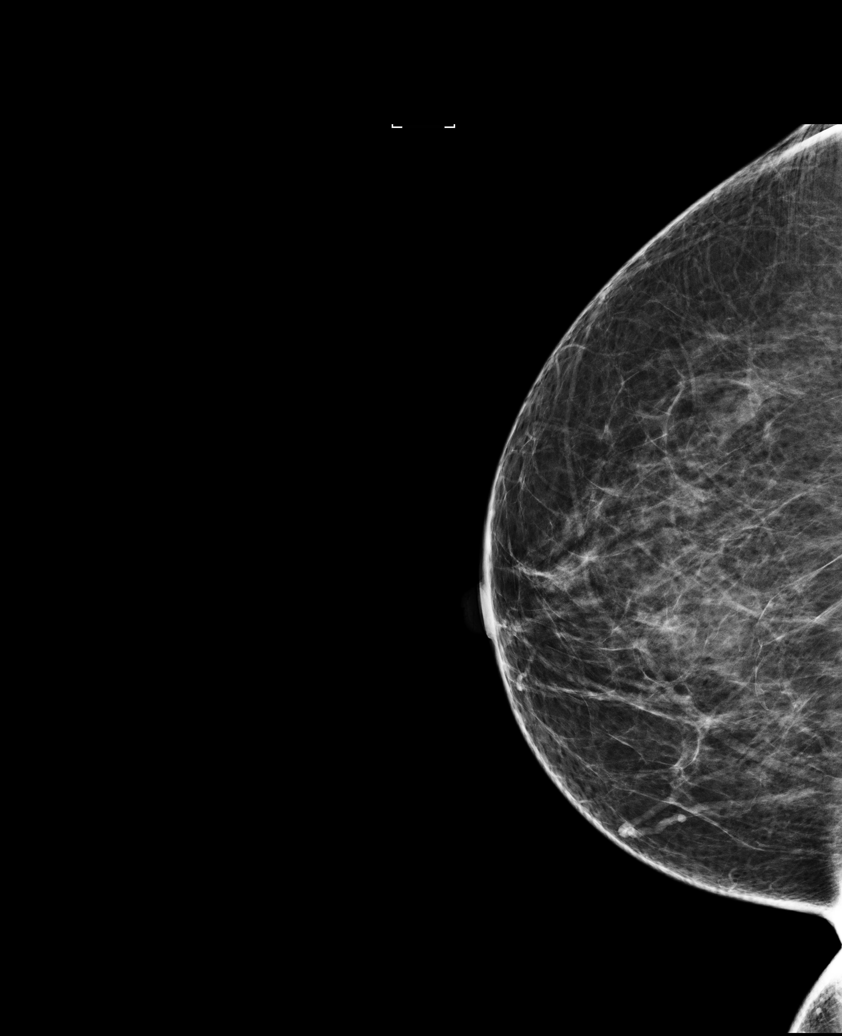

[5 of 5 positions shown; findings below may reference images not displayed]

ACR Breast Density Category b: There are scattered areas of
fibroglandular density.
FINDINGS: There are no findings suspicious for malignancy. Images were
processed with CAD.
IMPRESSION: No mammographic evidence of malignancy. A result letter of this
screening mammogram will be mailed directly to the patient.

RECOMMENDATION:
Screening mammogram in one year. (Code:AS-G-LCT)

BI-RADS CATEGORY  1: Negative.

## 2018-12-27 ENCOUNTER — Telehealth: Payer: Self-pay | Admitting: *Deleted

## 2018-12-27 NOTE — Telephone Encounter (Signed)
Patient called stating that she has concerns about her blood pressure and low heart rate. Patient stated that she is taking Lopressor 100 mg twice a day. Patient stated that she has been having headaches on the left side of her head and when she checks her blood pressure it is up. Patient stated that she took her blood pressure yesterday am blood pressure was 137/83 heart rate 52, 20 minutes later 134/81 HR 50. Patient stated that she had a headache last night and blood pressure was 146/91 heart rate 74. Patient stated that her heart rate has been low, but has gotten elevated blood pressures. Patient stated today her blood pressure reading was 130/79 and heart rate 55. Patient stated that she needs to make sure that she is taking her Lopressor correctly and wants to know what she should do about her elevated BP and low heart rate? [facility]

## 2018-12-27 NOTE — Telephone Encounter (Signed)
Ms. Gascoigne notified as instructed by telephone.  Appointment scheduled for 01/01/2019 at 11:20 am with Dr. Lorelei Pont.

## 2018-12-27 NOTE — Telephone Encounter (Signed)
We may need to change her meds  Keep same now w f/u mon

## 2019-01-01 ENCOUNTER — Encounter: Payer: Self-pay | Admitting: Family Medicine

## 2019-01-01 ENCOUNTER — Ambulatory Visit: Payer: Commercial Managed Care - PPO | Admitting: Family Medicine

## 2019-01-01 ENCOUNTER — Other Ambulatory Visit: Payer: Self-pay

## 2019-01-01 VITALS — BP 120/78 | HR 63 | Temp 98.2°F | Ht 68.0 in | Wt 239.5 lb

## 2019-01-01 DIAGNOSIS — R001 Bradycardia, unspecified: Secondary | ICD-10-CM | POA: Diagnosis not present

## 2019-01-01 DIAGNOSIS — R55 Syncope and collapse: Secondary | ICD-10-CM | POA: Diagnosis not present

## 2019-01-01 DIAGNOSIS — I10 Essential (primary) hypertension: Secondary | ICD-10-CM

## 2019-01-01 DIAGNOSIS — R42 Dizziness and giddiness: Secondary | ICD-10-CM | POA: Diagnosis not present

## 2019-01-01 MED ORDER — METOPROLOL TARTRATE 50 MG PO TABS: 50.0000 mg | ORAL_TABLET | Freq: Two times a day (BID) | ORAL | 3 refills | Status: DC

## 2019-01-01 MED ORDER — HYDROCHLOROTHIAZIDE 12.5 MG PO TABS: 12.5000 mg | ORAL_TABLET | Freq: Every day | ORAL | 3 refills | Status: DC
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
## 2019-01-07 ENCOUNTER — Other Ambulatory Visit: Payer: Self-pay | Admitting: Family Medicine

## 2019-01-10 ENCOUNTER — Other Ambulatory Visit: Payer: Commercial Managed Care - PPO

## 2019-01-10 ENCOUNTER — Other Ambulatory Visit: Payer: Self-pay

## 2019-01-10 ENCOUNTER — Other Ambulatory Visit: Payer: Self-pay | Admitting: Family Medicine

## 2019-01-10 DIAGNOSIS — Z0000 Encounter for general adult medical examination without abnormal findings: Secondary | ICD-10-CM

## 2019-01-10 DIAGNOSIS — E0590 Thyrotoxicosis, unspecified without thyrotoxic crisis or storm: Secondary | ICD-10-CM

## 2019-01-10 DIAGNOSIS — E559 Vitamin D deficiency, unspecified: Secondary | ICD-10-CM

## 2019-01-10 DIAGNOSIS — Z131 Encounter for screening for diabetes mellitus: Secondary | ICD-10-CM

## 2019-01-16 ENCOUNTER — Other Ambulatory Visit: Payer: Self-pay | Admitting: Family Medicine

## 2019-01-17 ENCOUNTER — Encounter: Payer: Commercial Managed Care - PPO | Admitting: Family Medicine

## 2019-01-23 ENCOUNTER — Other Ambulatory Visit: Payer: Self-pay | Admitting: Family Medicine

## 2019-03-15 IMAGING — US US RENAL
1 series · 14 of 25 positions shown · non-contrast
Comparison: None.

CLINICAL DATA: 56-year-old female with hypertension, tachycardia.

EXAM:
RENAL / URINARY TRACT ULTRASOUND COMPLETE

[Series 1: us renal · 0.26mm/px · 14 of 44 slices shown]
[im 1/44]
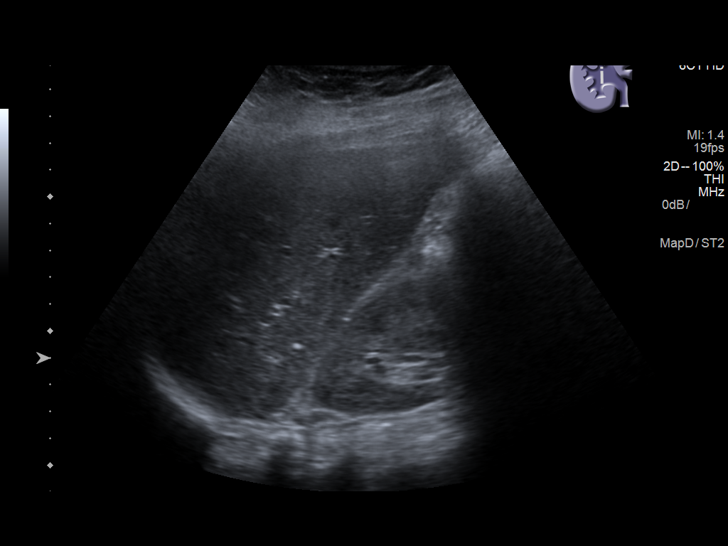
[im 4/44]
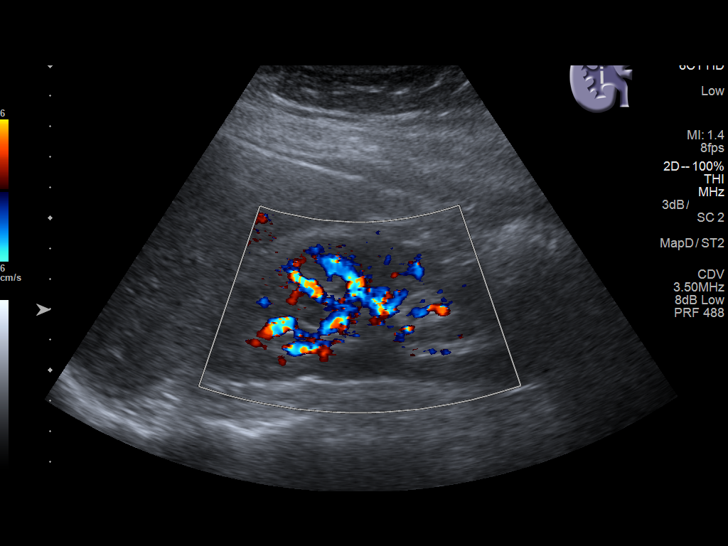
[im 8/44]
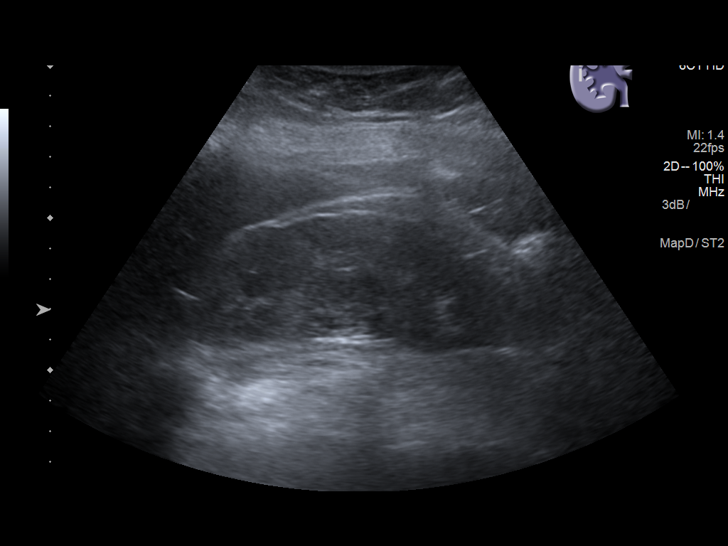
[im 11/44]
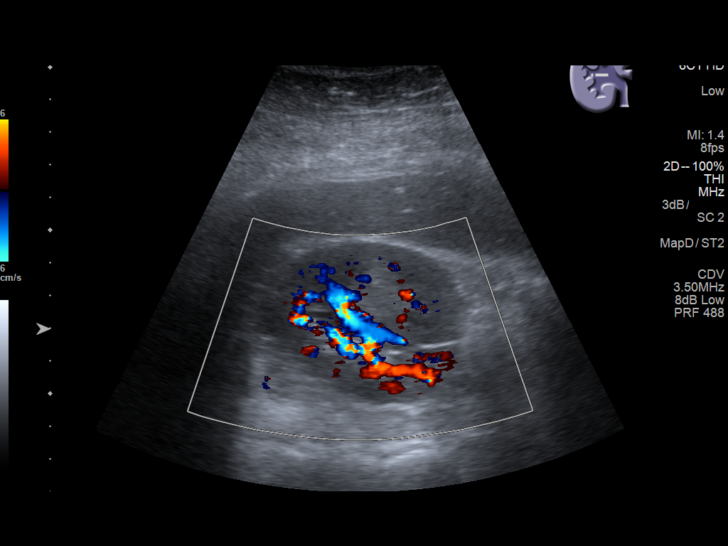
[im 15/44]
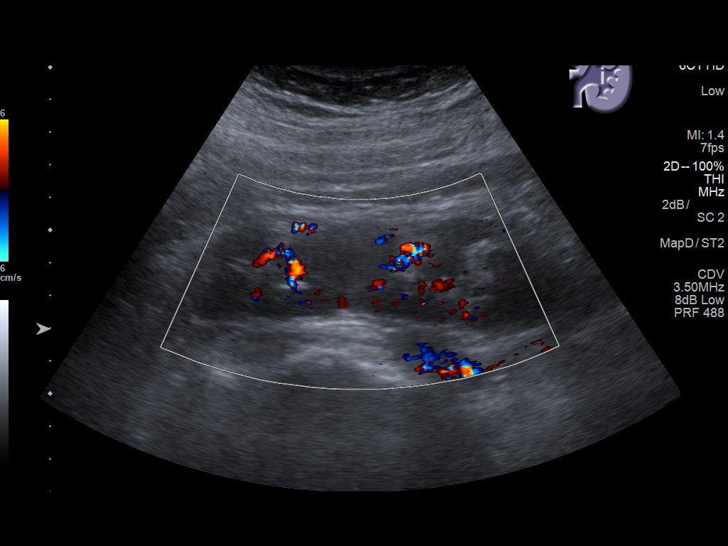
[im 17/44]
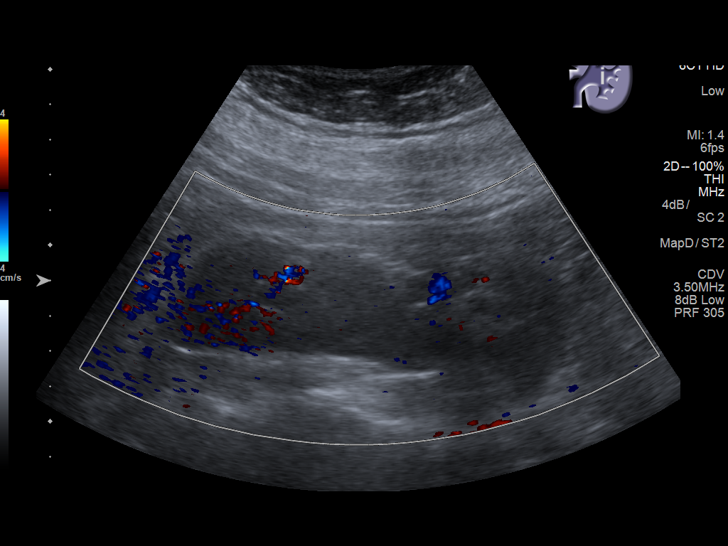
[im 20/44]
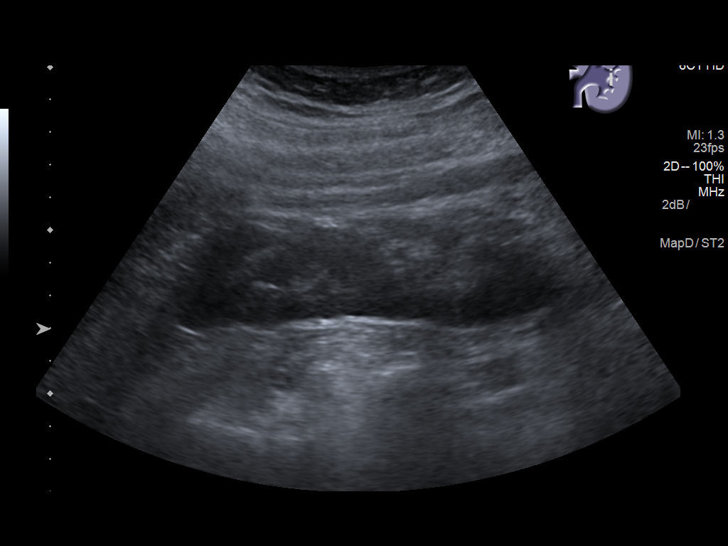
[im 24/44]
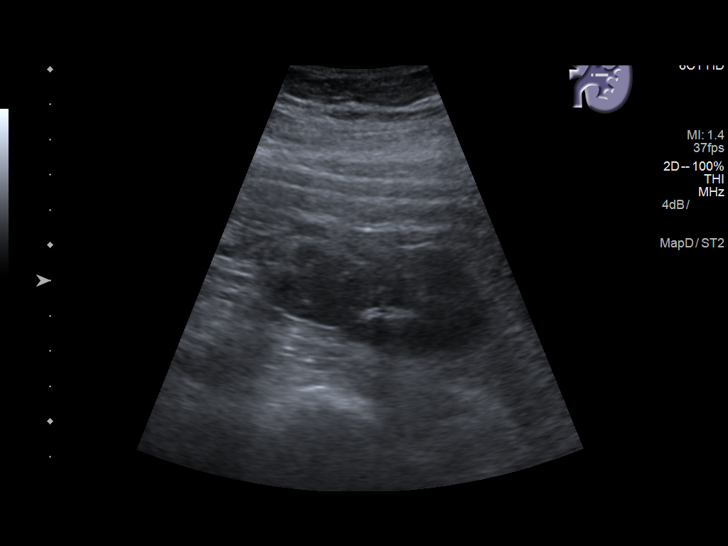
[im 27/44]
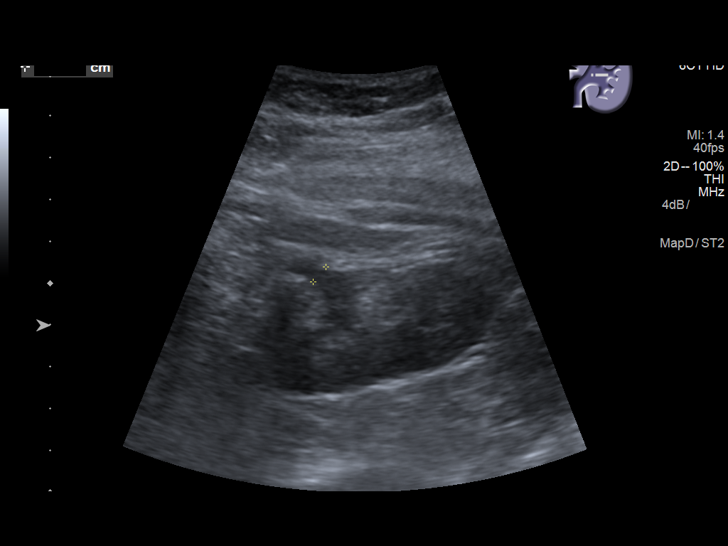
[im 29/44]
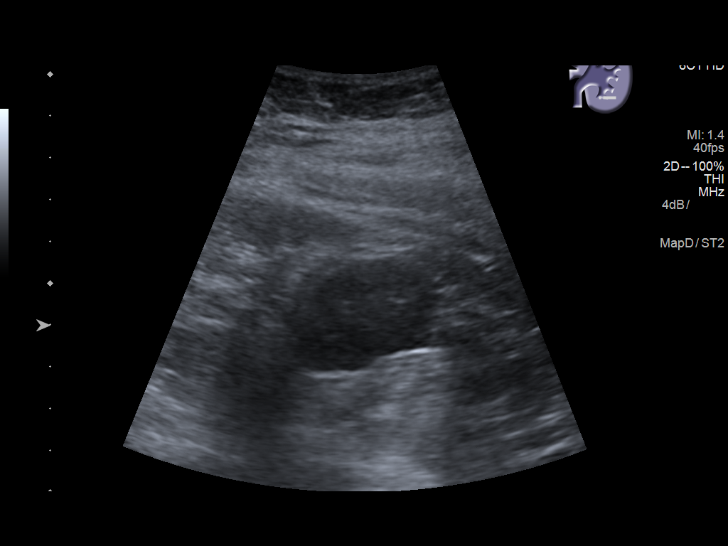
[im 33/44]
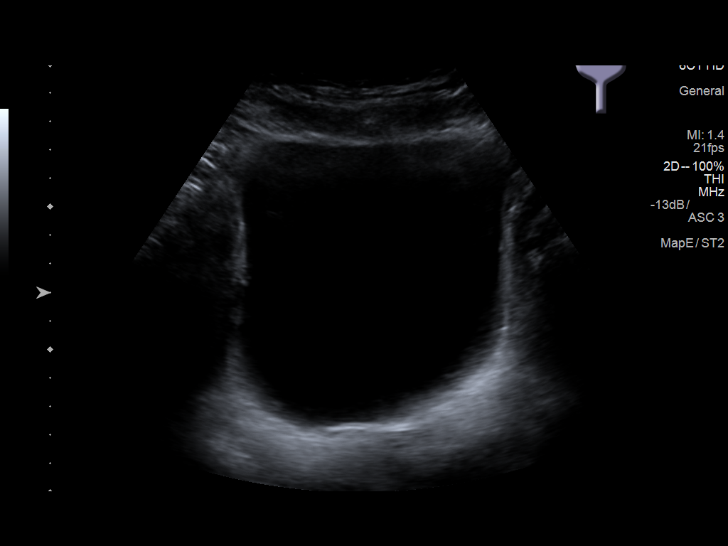
[im 36/44]
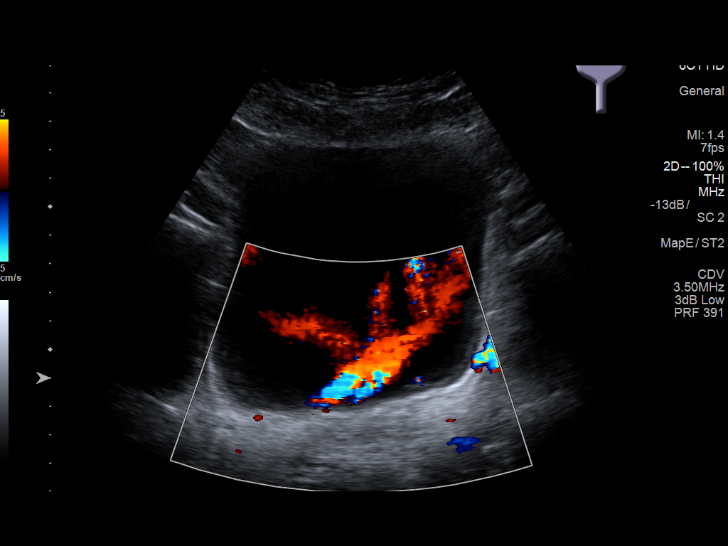
[im 40/44]
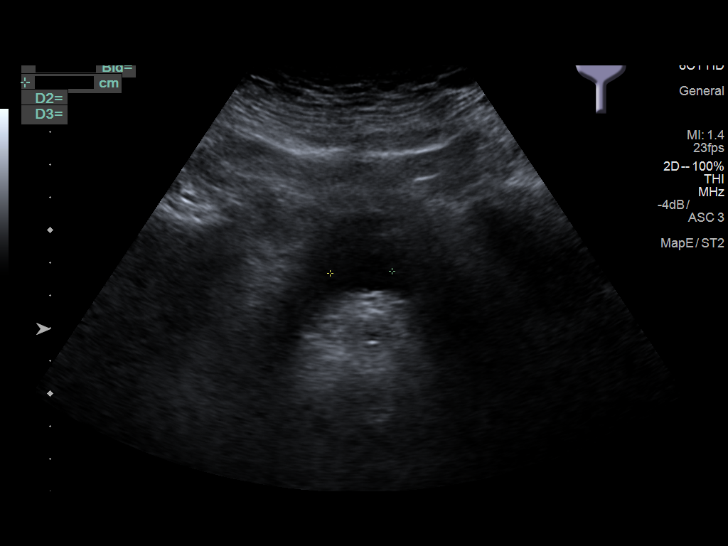
[im 44/44]
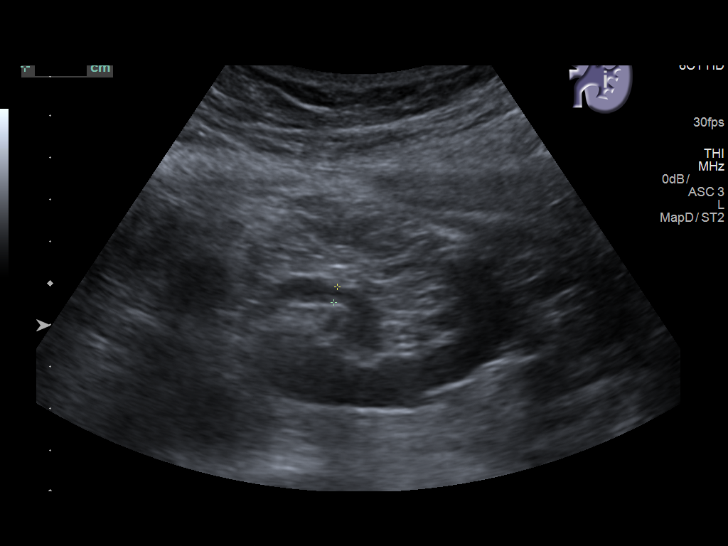

[14 of 25 positions shown; findings below may reference images not displayed]

FINDINGS: Right Kidney:

Length: 12.0 cm. Echogenicity within normal limits. No mass or
hydronephrosis visualized.

Left Kidney:

Length: 11.4 cm. The left kidney is located somewhat caudal to the
normal left renal fossa and there is mild left renal malrotation.
Column of Bertin suspected at the mid pole. Questionable collecting
system duplication. Cortical echogenicity Within normal limits. No
mass or hydronephrosis visualized.

Bladder:

Appears normal for degree of bladder distention. Both ureteral jets
detected with Doppler. Prevoid bladder volume of 592 mL. Trace
postvoid residual of 4-5 mL.
IMPRESSION: 1. Congenital left renal ectopia, and possible duplicated left renal
collecting system (normal variants).
2. No acute renal findings, and otherwise normal ultrasound
appearance of both kidneys.
3. Negative urinary bladder.  No significant postvoid residual.

## 2019-03-15 IMAGING — US US THYROID
1 series · 14 of 25 positions shown · non-contrast
Comparison: None.

CLINICAL DATA: Hyperthyroid.

EXAM:
THYROID ULTRASOUND
TECHNIQUE: Ultrasound examination of the thyroid gland and adjacent soft
tissues was performed.

[Series 1: us thyroid · 0.07mm/px · 14 of 37 slices shown]
[im 1/37]
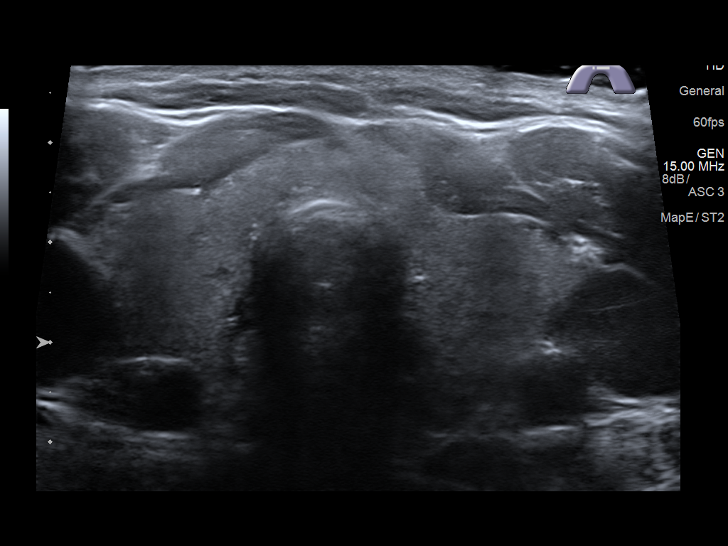
[im 4/37]
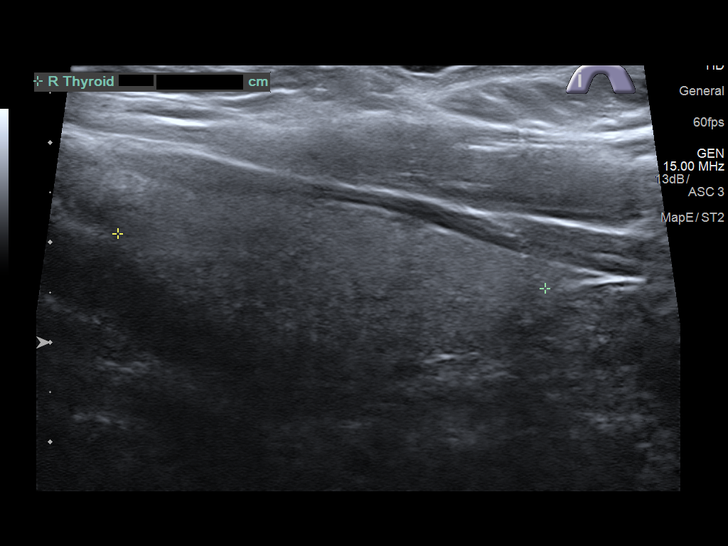
[im 7/37]
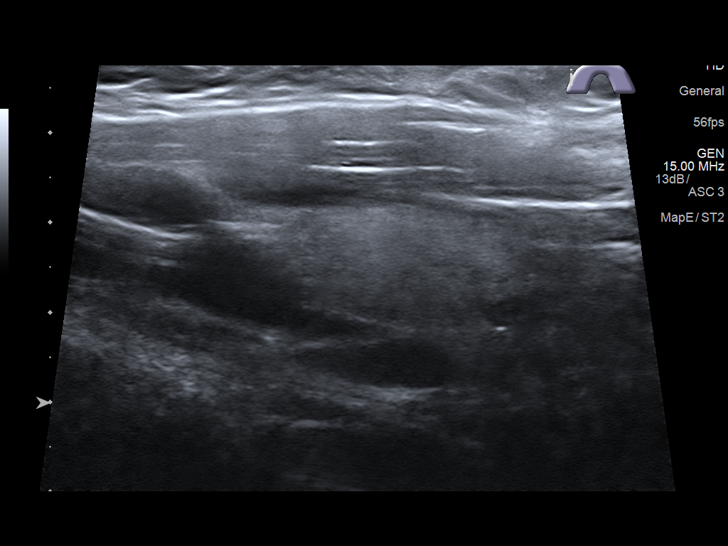
[im 10/37]
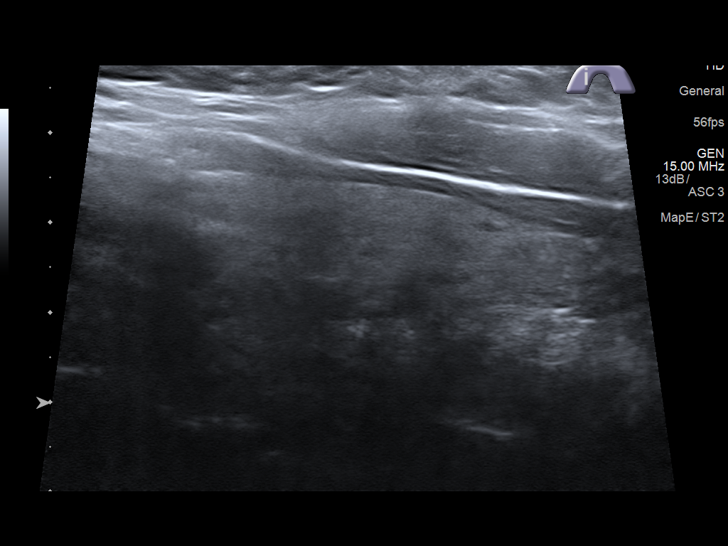
[im 13/37]
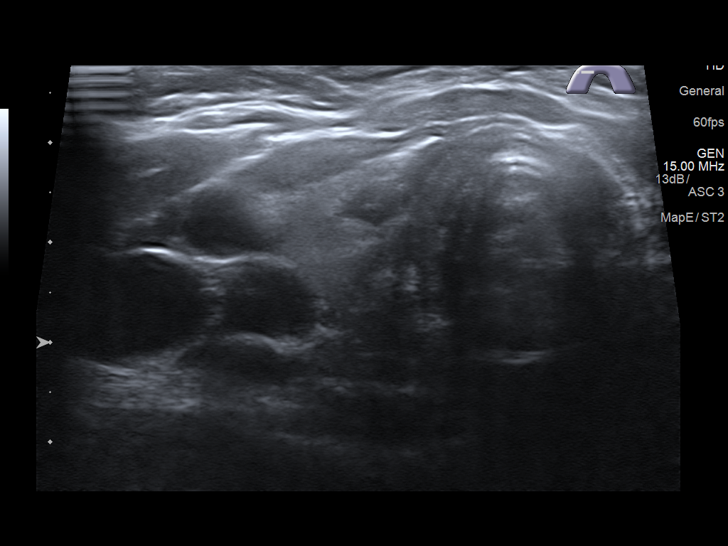
[im 14/37]
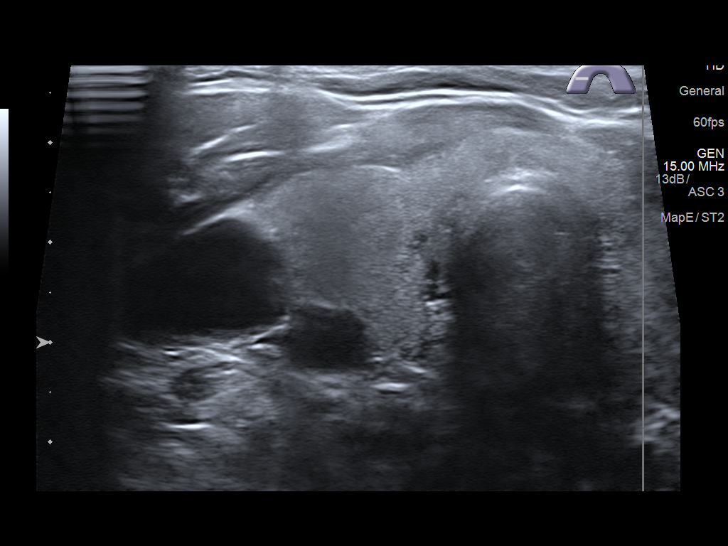
[im 17/37]
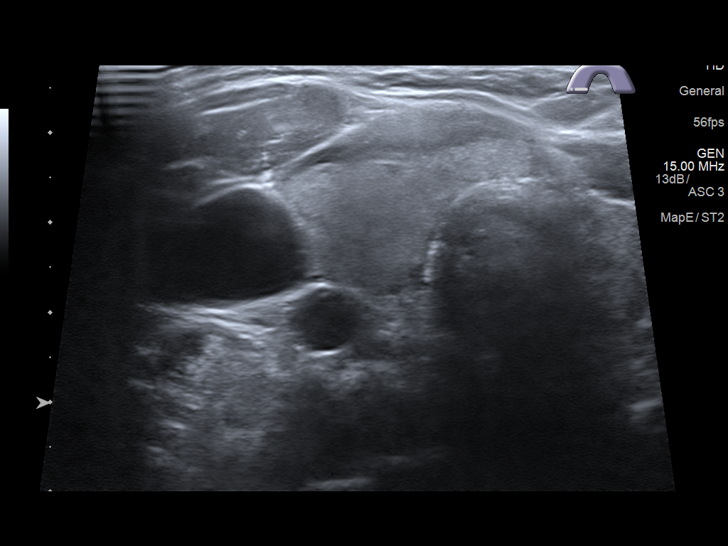
[im 20/37]
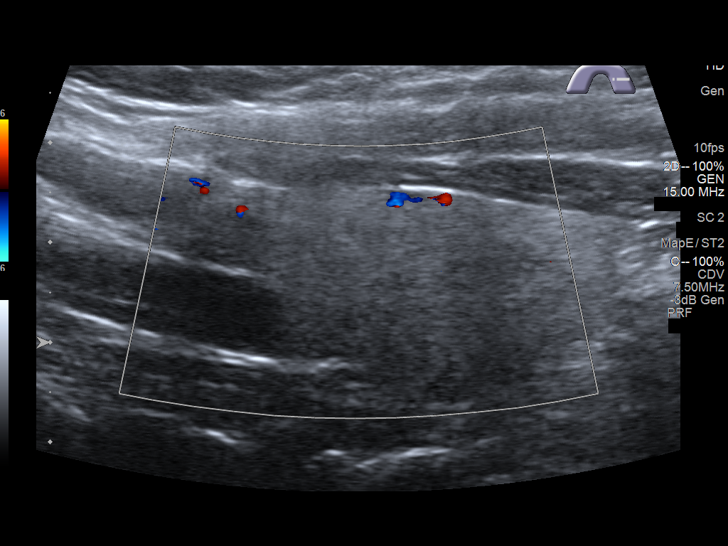
[im 23/37]
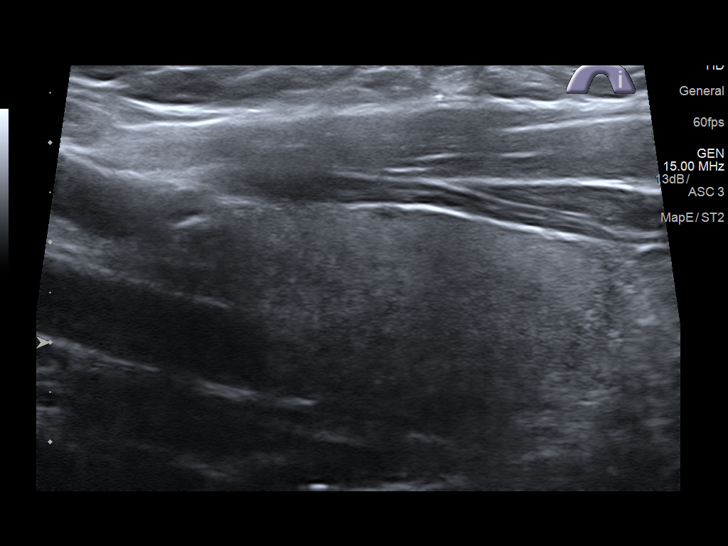
[im 25/37]
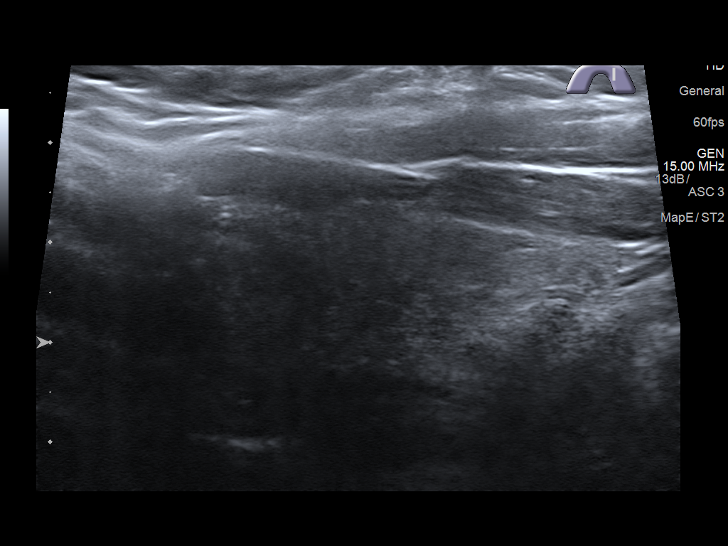
[im 28/37]
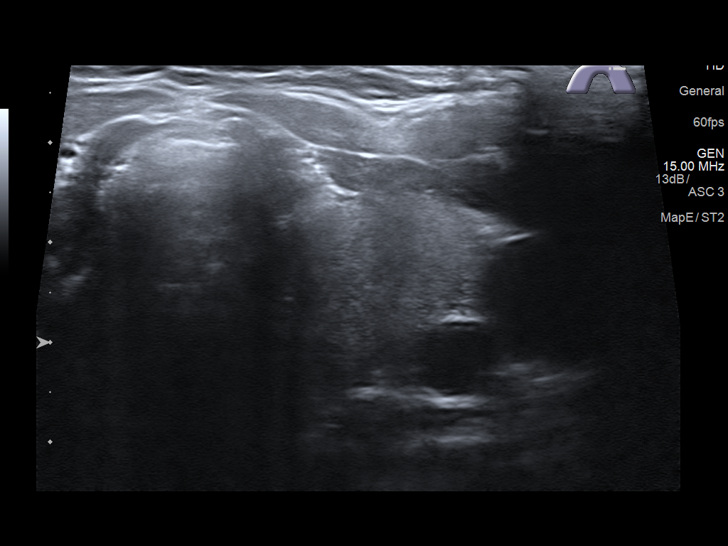
[im 31/37]
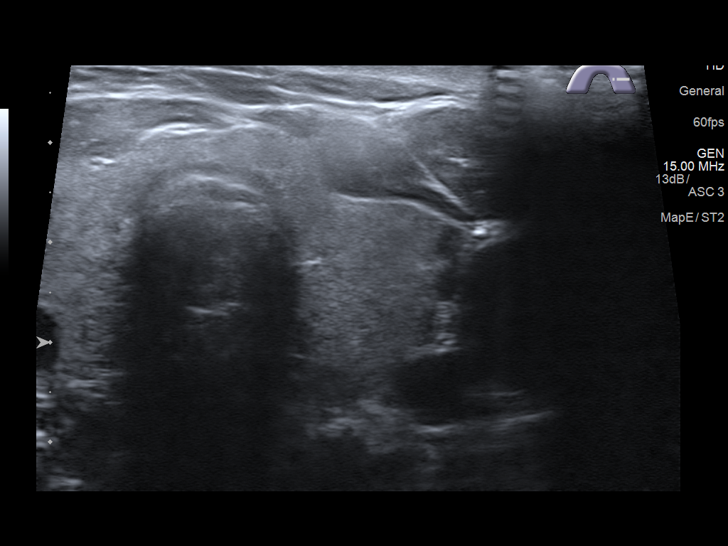
[im 34/37]
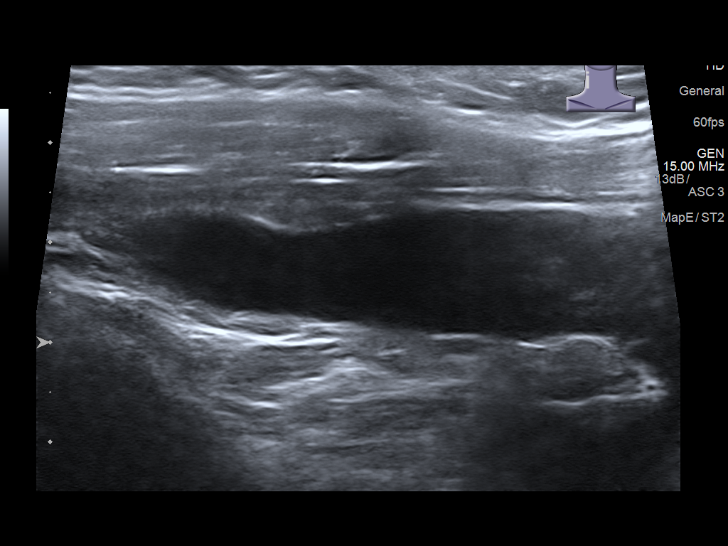
[im 37/37]
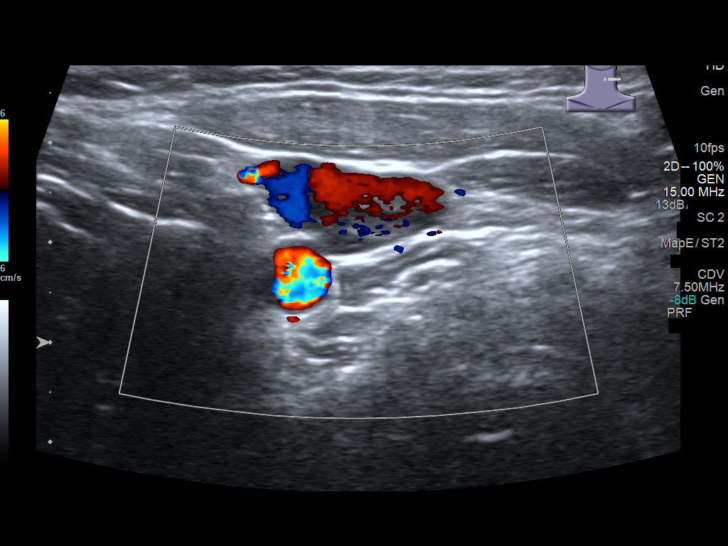

[14 of 25 positions shown; findings below may reference images not displayed]

FINDINGS: Parenchymal Echotexture: Normal

Isthmus: 0.4 cm

Right lobe: 4.3 x 2.2 x 1.7 cm

Left lobe: 4.3 x 2.1 x 1.4 cm

_________________________________________________________

Estimated total number of nodules >/= 1 cm: 0

Number of spongiform nodules >/=  2 cm not described below (TR1): 0

Number of mixed cystic and solid nodules >/= 1.5 cm not described
below (TR2): 0

_________________________________________________________

No discrete nodules are seen within the thyroid gland.
IMPRESSION: No evidence of nodule.  The gland is within normal limits in size.

The above is in keeping with the ACR TI-RADS recommendations - [HOSPITAL] 2954;[DATE].

## 2019-03-22 ENCOUNTER — Telehealth: Payer: Self-pay

## 2019-03-22 NOTE — Telephone Encounter (Signed)
Pt has chest tightness and pressure in mid chest and last night BP 185/103 when pt got off work; after rest at home last night BP 140/ something. Now pts BP is 145/98 P 55. Pt said she does have a h/a now,pain level 4-5, S/T, fatigue.pt does have swelling in her ankles and pt does wear TED hose. Pt does not have a way to ck her temp now but last night at work first registered 100.4 temp rechecked was 99.7 and then on final reck 97. Pt did work last night. Pt said has had a big outbreak of + covid with pts and workers at [employer]. Pt has taken HCTZ 12.5 and Lopressor 50 mg earlier today. Pt will take another Lopressor when she gets off work tonight at 11pm. Pt has not missed any BP meds. No N&V,no sweats, no SOB and no weakness.Advised pt she should go to UC for eval. Pt said she will go to [facility] in [place] for eval and possible testing. FYI to Dr Lorelei Pont.

## 2019-03-22 NOTE — Telephone Encounter (Signed)
agree

## 2019-03-27 ENCOUNTER — Other Ambulatory Visit: Payer: Self-pay | Admitting: Family Medicine

## 2019-03-30 ENCOUNTER — Other Ambulatory Visit: Payer: Self-pay

## 2019-03-30 ENCOUNTER — Other Ambulatory Visit (INDEPENDENT_AMBULATORY_CARE_PROVIDER_SITE_OTHER): Payer: Self-pay

## 2019-03-30 DIAGNOSIS — E0590 Thyrotoxicosis, unspecified without thyrotoxic crisis or storm: Secondary | ICD-10-CM

## 2019-03-30 DIAGNOSIS — E559 Vitamin D deficiency, unspecified: Secondary | ICD-10-CM

## 2019-03-30 DIAGNOSIS — Z131 Encounter for screening for diabetes mellitus: Secondary | ICD-10-CM

## 2019-03-30 DIAGNOSIS — Z0000 Encounter for general adult medical examination without abnormal findings: Secondary | ICD-10-CM

## 2019-03-30 LAB — T3, FREE: T3, Free: 3.5 pg/mL (ref 2.3–4.2)

## 2019-03-30 LAB — BASIC METABOLIC PANEL
BUN: 14 mg/dL (ref 6–23)
CO2: 31 mEq/L (ref 19–32)
Calcium: 9 mg/dL (ref 8.4–10.5)
Chloride: 105 mEq/L (ref 96–112)
Creatinine, Ser: 0.75 mg/dL (ref 0.40–1.20)
GFR: 95.25 mL/min (ref >60.00)
Glucose, Bld: 99 mg/dL (ref 70–99)
Potassium: 3.9 mEq/L (ref 3.5–5.1)
Sodium: 142 mEq/L (ref 135–145)

## 2019-03-30 LAB — HEPATIC FUNCTION PANEL
ALT: 13 U/L (ref 0–35)
AST: 20 U/L (ref 0–37)
Albumin: 3.9 g/dL (ref 3.5–5.2)
Alkaline Phosphatase: 99 U/L (ref 39–117)
Bilirubin, Direct: 0.1 mg/dL (ref 0.0–0.3)
Total Bilirubin: 0.3 mg/dL (ref 0.2–1.2)
Total Protein: 6.6 g/dL (ref 6.0–8.3)

## 2019-03-30 LAB — LIPID PANEL
Cholesterol: 209 mg/dL — ABNORMAL HIGH (ref 0–200)
HDL: 40.8 mg/dL (ref >39.00)
LDL Cholesterol: 129 mg/dL — ABNORMAL HIGH (ref 0–99)
NonHDL: 167.99
Total CHOL/HDL Ratio: 5
Triglycerides: 195 mg/dL — ABNORMAL HIGH (ref 0.0–149.0)
VLDL: 39 mg/dL (ref 0.0–40.0)

## 2019-03-30 LAB — CBC WITH DIFFERENTIAL/PLATELET
Basophils Absolute: 0 10*3/uL (ref 0.0–0.1)
Basophils Relative: 0.7 % (ref 0.0–3.0)
Eosinophils Absolute: 0.3 10*3/uL (ref 0.0–0.7)
Eosinophils Relative: 4.1 % (ref 0.0–5.0)
HCT: 38 % (ref 36.0–46.0)
Hemoglobin: 12.5 g/dL (ref 12.0–15.0)
Lymphocytes Relative: 49.6 % — ABNORMAL HIGH (ref 12.0–46.0)
Lymphs Abs: 3.2 10*3/uL (ref 0.7–4.0)
MCHC: 32.8 g/dL (ref 30.0–36.0)
MCV: 80.4 fl (ref 78.0–100.0)
Monocytes Absolute: 0.6 10*3/uL (ref 0.1–1.0)
Monocytes Relative: 9.8 % (ref 3.0–12.0)
Neutro Abs: 2.3 10*3/uL (ref 1.4–7.7)
Neutrophils Relative %: 35.8 % — ABNORMAL LOW (ref 43.0–77.0)
Platelets: 230 10*3/uL (ref 150.0–400.0)
RBC: 4.73 Mil/uL (ref 3.87–5.11)
RDW: 13.9 % (ref 11.5–15.5)
WBC: 6.5 10*3/uL (ref 4.0–10.5)

## 2019-03-30 LAB — IBC + FERRITIN
Ferritin: 88.6 ng/mL (ref 10.0–291.0)
Iron: 28 ug/dL — ABNORMAL LOW (ref 42–145)
Saturation Ratios: 10.6 % — ABNORMAL LOW (ref 20.0–50.0)
Transferrin: 188 mg/dL — ABNORMAL LOW (ref 212.0–360.0)

## 2019-03-30 LAB — VITAMIN D 25 HYDROXY (VIT D DEFICIENCY, FRACTURES): VITD: 24.69 ng/mL — ABNORMAL LOW (ref 30.00–100.00)

## 2019-03-30 LAB — TSH: TSH: 1.71 u[IU]/mL (ref 0.35–4.50)

## 2019-03-30 LAB — T4, FREE: Free T4: 1.22 ng/dL (ref 0.60–1.60)

## 2019-03-30 LAB — HEMOGLOBIN A1C: Hgb A1c MFr Bld: 6.3 % (ref 4.6–6.5)

## 2019-04-03 ENCOUNTER — Encounter: Payer: Commercial Managed Care - PPO | Admitting: Family Medicine

## 2019-04-26 ENCOUNTER — Encounter: Payer: Self-pay | Admitting: Family Medicine

## 2019-04-26 DIAGNOSIS — Z0289 Encounter for other administrative examinations: Secondary | ICD-10-CM

## 2019-05-02 ENCOUNTER — Telehealth: Payer: Self-pay | Admitting: *Deleted

## 2019-05-02 NOTE — Telephone Encounter (Signed)
Yes

## 2019-05-02 NOTE — Telephone Encounter (Addendum)
Patient left a voicemail wanting to know if it is okay for her to take the covid vaccine?  Patient stated that they are given them out today and she thinks Saturday where she works. Patient requested a call back.

## 2019-05-02 NOTE — Telephone Encounter (Signed)
Ms. Leite notified by telephone that it is okay for her to get the Covid vaccine per Dr. Patsy Lager.

## 2019-05-25 ENCOUNTER — Other Ambulatory Visit: Payer: Self-pay | Admitting: Family Medicine
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
## 2019-07-19 ENCOUNTER — Encounter: Payer: Self-pay | Admitting: Family Medicine

## 2019-07-19 ENCOUNTER — Ambulatory Visit (INDEPENDENT_AMBULATORY_CARE_PROVIDER_SITE_OTHER): Payer: No Typology Code available for payment source | Admitting: Family Medicine

## 2019-07-19 ENCOUNTER — Other Ambulatory Visit: Payer: Self-pay

## 2019-07-19 VITALS — BP 120/80 | HR 55 | Temp 98.5°F | Ht 68.0 in | Wt 245.2 lb

## 2019-07-19 DIAGNOSIS — W19XXXA Unspecified fall, initial encounter: Secondary | ICD-10-CM

## 2019-07-19 DIAGNOSIS — R55 Syncope and collapse: Secondary | ICD-10-CM | POA: Diagnosis not present

## 2019-07-19 MED ORDER — MELOXICAM 15 MG PO TABS: 15.0000 mg | ORAL_TABLET | Freq: Every day | ORAL | 5 refills | Status: DC

## 2019-07-19 MED ORDER — METOPROLOL TARTRATE 50 MG PO TABS: 50.0000 mg | ORAL_TABLET | Freq: Two times a day (BID) | ORAL | 1 refills | Status: DC

## 2019-07-19 MED ORDER — SUMATRIPTAN SUCCINATE 50 MG PO TABS: 50.0000 mg | ORAL_TABLET | ORAL | 5 refills | Status: DC | PRN

## 2019-07-20 ENCOUNTER — Encounter: Payer: Self-pay | Admitting: Cardiology

## 2019-07-20 ENCOUNTER — Ambulatory Visit (INDEPENDENT_AMBULATORY_CARE_PROVIDER_SITE_OTHER): Payer: No Typology Code available for payment source

## 2019-07-20 ENCOUNTER — Ambulatory Visit (INDEPENDENT_AMBULATORY_CARE_PROVIDER_SITE_OTHER): Payer: No Typology Code available for payment source | Admitting: Cardiology

## 2019-07-20 VITALS — BP 147/79 | HR 57 | Temp 96.6°F | Ht 68.0 in | Wt 248.2 lb

## 2019-07-20 DIAGNOSIS — R55 Syncope and collapse: Secondary | ICD-10-CM | POA: Diagnosis not present

## 2019-07-20 DIAGNOSIS — I471 Supraventricular tachycardia: Secondary | ICD-10-CM | POA: Diagnosis not present

## 2019-07-20 DIAGNOSIS — I10 Essential (primary) hypertension: Secondary | ICD-10-CM | POA: Diagnosis not present

## 2019-07-20 NOTE — Patient Instructions (Signed)
Medication Instructions:  Your physician recommends that you continue on your current medications as directed. Please refer to the Current Medication list given to you today.  *If you need a refill on your cardiac medications before your next appointment, please call your pharmacy*   Lab Work: none If you have labs (blood work) drawn today and your tests are completely normal, you will receive your results only by: Marland Kitchen MyChart Message (if you have MyChart) OR . A paper copy in the mail If you have any lab test that is abnormal or we need to change your treatment, we will call you to review the results.   Testing/Procedures: 1- Echocardiogram - Your physician has requested that you have an echocardiogram. Echocardiography is a painless test that uses sound waves to create images of your heart. It provides your doctor with information about the size and shape of your heart and how well your heart's chambers and valves are working. This procedure takes approximately one hour. There are no restrictions for this procedure. You may get an IV, if needed, to receive an ultrasound enhancing agent through to better visualize your heart.    2- ZIO for 14 days - START TODAY; REMOVE ON 08/03/19. Your physician has recommended that you wear a Zio monitor. This monitor is a medical device that records the heart's electrical activity. Doctors most often use these monitors to diagnose arrhythmias. Arrhythmias are problems with the speed or rhythm of the heartbeat. The monitor is a small device applied to your chest. You can wear one while you do your normal daily activities. While wearing this monitor if you have any symptoms to push the button and record what you felt. Once you have worn this monitor for the period of time provider prescribed (Usually 14 days), you will return the monitor device in the postage paid box. Once it is returned they will download the data collected and provide [place] with a report which the  provider will then review and we will call you with those results. Important tips:  1. Avoid showering during the first 24 hours of wearing the monitor. 2. Avoid excessive sweating to help maximize wear time. 3. Do not submerge the device, no hot tubs, and no swimming pools. 4. Keep any lotions or oils away from the patch. 5. After 24 hours you may shower with the patch on. Take brief showers with your back facing the shower head.  6. Do not remove patch once it has been placed because that will interrupt data and decrease adhesive wear time. 7. Push the button when you have any symptoms and write down what you were feeling. 8. Once you have completed wearing your monitor, remove and place into box which has postage paid and place in your outgoing mailbox.  9. If for some reason you have misplaced your box then call our office and we can provide another box and/or mail it off for you.  Follow-Up: At [facility], you and your health needs are our priority.  As part of our continuing mission to provide you with exceptional heart care, we have created designated Provider Care Teams.  These Care Teams include your primary Cardiologist (physician) and Advanced Practice Providers (APPs -  Physician Assistants and Nurse Practitioners) who all work together to provide you with the care you need, when you need it.  We recommend signing up for the patient portal called "MyChart".  Sign up information is provided on this After Visit Summary.  MyChart is used  to connect with patients for Virtual Visits (Telemedicine).  Patients are able to view lab/test results, encounter notes, upcoming appointments, etc.  Non-urgent messages can be sent to your provider as well.   To learn more about what you can do with MyChart, go to [email or web address].    Your next appointment:   After ZIO and echo completed.  The format for your next appointment:   In Person  Provider:   Debbe Odea, MD     Echocardiogram An echocardiogram is a procedure that uses painless sound waves (ultrasound) to produce an image of the heart. Images from an echocardiogram can provide important information about:  Signs of coronary artery disease (CAD).  Aneurysm detection. An aneurysm is a weak or damaged part of an artery wall that bulges out from the normal force of blood pumping through the body.  Heart size and shape. Changes in the size or shape of the heart can be associated with certain conditions, including heart failure, aneurysm, and CAD.  Heart muscle function.  Heart valve function.  Signs of a past heart attack.  Fluid buildup around the heart.  Thickening of the heart muscle.  A tumor or infectious growth around the heart valves. Tell a health care provider about:  Any allergies you have.  All medicines you are taking, including vitamins, herbs, eye drops, creams, and over-the-counter medicines.  Any blood disorders you have.  Any surgeries you have had.  Any medical conditions you have.  Whether you are pregnant or may be pregnant. What are the risks? Generally, this is a safe procedure. However, problems may occur, including:  Allergic reaction to dye (contrast) that may be used during the procedure. What happens before the procedure? No specific preparation is needed. You may eat and drink normally. What happens during the procedure?   An IV tube may be inserted into one of your veins.  You may receive contrast through this tube. A contrast is an injection that improves the quality of the pictures from your heart.  A gel will be applied to your chest.  A wand-like tool (transducer) will be moved over your chest. The gel will help to transmit the sound waves from the transducer.  The sound waves will harmlessly bounce off of your heart to allow the heart images to be captured in real-time motion. The images will be recorded on a computer. The procedure may  vary among health care providers and hospitals. What happens after the procedure?  You may return to your normal, everyday life, including diet, activities, and medicines, unless your health care provider tells you not to do that. Summary  An echocardiogram is a procedure that uses painless sound waves (ultrasound) to produce an image of the heart.  Images from an echocardiogram can provide important information about the size and shape of your heart, heart muscle function, heart valve function, and fluid buildup around your heart.  You do not need to do anything to prepare before this procedure. You may eat and drink normally.  After the echocardiogram is completed, you may return to your normal, everyday life, unless your health care provider tells you not to do that. This information is not intended to replace advice given to you by your health care provider. Make sure you discuss any questions you have with your health care provider. Document Revised: 07/20/2018 Document Reviewed: 05/01/2016 Elsevier Patient Education  2020 [employer].
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
## 2019-07-23 ENCOUNTER — Telehealth: Payer: Self-pay | Admitting: *Deleted

## 2019-07-23 MED ORDER — HYDROCHLOROTHIAZIDE 25 MG PO TABS: 25.0000 mg | ORAL_TABLET | Freq: Every day | ORAL | 3 refills | Status: DC

## 2019-07-23 NOTE — Telephone Encounter (Signed)
Annette Romero, Can you call her?  I am sending her message to her cardiologist now.  I want to involve her cardiologist in that decision, since he is actively working her up for heart problems. Thanks for your help, Arlys John.

## 2019-07-23 NOTE — Telephone Encounter (Signed)
Patient called stating that she saw Dr. Patsy Lager Thursday. Patient stated that she had a heart monitor put on Friday. Patient stated that her blood pressure is real high and she is concerned about it. Patient stated that the Lopressor is not helping. Patient stated that her blood pressure Friday was 166/106, Saturday 173/108 and Sunday 137/89 at 6:00 pm 167/106. Patient stated that she had a previous script on hand for Norvasc 5 Mg and took it Sunday and blood pressure came down to 153/97. Patient stated  this morning her blood pressure is 139/77 heart rate 71, but her head feels foggy. Patient stated that she seems to be getting the high blood pressure readings while she is at work. Patient stated that she is off today. Patient stated that her heart rate over the weekend was 55-58. Patient wants to know what Dr.Copland recommends that she do for her blood pressure.

## 2019-07-23 NOTE — Telephone Encounter (Signed)
Ms. Axton notified as instructed by telephone.  She will continue her current blood pressure medication until she hears back from Dr. Patsy Lager or her cardiologist.

## 2019-07-23 NOTE — Telephone Encounter (Signed)
Debbe Odea, MD  You; Hannah Beat, MD 20 minutes ago (4:42 PM)   Victorino Dike, please increase patient's dose of HCTZ to 25 mg daily for better BP control. Thank you.      Patient notified and aware to increase HCTZ to 25 mg daily.  She will keep [place] updated if needed. Rx sent to pharmacy.

## 2019-07-25 ENCOUNTER — Telehealth: Payer: Self-pay | Admitting: Cardiology

## 2019-07-25 NOTE — Telephone Encounter (Signed)
-----   Message from Debbe Odea, MD sent at 07/25/2019 4:23 PM EDT -----  Please increase patient's metoprolol to 100 mg twice daily. Continue wearing ZIO monitor for the duration scheduled. We will review monitor. Keep follow-up appointment

## 2019-07-25 NOTE — Telephone Encounter (Signed)
Patient called the office stating that she is still having problems with her blood pressure and heart rate. Patient stated that she had talked with the nurse Victorino Dike at her cardiologist office. Advised patient that she needs to contact her cardiologist since he is taking care of adjusting her medications.  Patient stated that she will call and speak to [facility] the nurse that she had spoken with.

## 2019-07-25 NOTE — Telephone Encounter (Signed)
Addressed in another phone call to office today. Closing this encounter.

## 2019-07-25 NOTE — Telephone Encounter (Signed)
The patient called the office this morning with reports of elevated HR's- call sent directly to triage.  I spoke with the patient regarding her reported elevated HR's. She states these occurred last night while at work (2nd shift).  BP (HR): last night 2140- 137/94 (197) 2150- 147/98 (192) 2200- (187) ?- (149) ?- (110) ?- (97) 2330- 130/95 (118)  BP (HR): this morning 1000- 132/91 (61)   The patient reports feeling tired/ worn out this morning. She reports these episodes happen ~ every 2-3 months. The last one was ~ 1 month ago. She has had to call EMS before and was given adenosine for her SVT.   She is currently wearing her ZIO monitor and not due to take this off until next week. The patient confirms she is taking metoprolol tart 50 mg BID, but she had her son bring her medications to work last night and she took metoprolol tart 100 mg. She reports she was previously on metoprolol tart 100 mg BID, but this was making her bradycardic. I advised her that taking the 100 mg of metoprolol last night was appropriate.  The patient is stable this morning.  I have advised her I will review the above information with Dr. Azucena Cecil to see if he would just recommend she use extra metoprolol as needed for her episodes or if there is anything else in the interim he would recommend until her monitor results.  The patient voices understanding and is agreeable. She is aware we will call back with MD recommendations once received.

## 2019-07-25 NOTE — Telephone Encounter (Signed)
STAT if HR is under 50 or over 120 (normal HR is 60-100 beats per minute)  1) What is your heart rate? Now 132/91 HR 61  2) Do you have a log of your heart rate readings (document readings)? Last night at work  145/109 HR 197 127/97 HR 110  3) Do you have any other symptoms? Lightheaded, hot, a little dizzy

## 2019-07-25 NOTE — Telephone Encounter (Signed)
I spoke with Dr. Azucena Cecil about the recommendations below. I advised him that the patient's HR this morning was 61 bpm and her HR at her office visit with him was 57 bpm on 07/20/19. Per Dr. Azucena Cecil change recommendations to: 1) Continue metoprolol tart 50 mg BID 2) she may take an extra 50 mg BID as needed (to total 100 mg) if her HR's are elevated  I attempted to call the patient. No answer- I left a message to please call back.

## 2019-07-26 NOTE — Telephone Encounter (Signed)
No answer. Left message to call back.   

## 2019-07-26 NOTE — Telephone Encounter (Signed)
Patient calling back. She verbalized understanding of Dr Merita Norton recommendations. She does not need any additional metoprolol sent in at this time.

## 2019-07-30 ENCOUNTER — Encounter: Payer: Self-pay | Admitting: Family Medicine

## 2019-08-09 ENCOUNTER — Telehealth: Payer: Self-pay | Admitting: Family Medicine

## 2019-08-09 DIAGNOSIS — E0590 Thyrotoxicosis, unspecified without thyrotoxic crisis or storm: Secondary | ICD-10-CM

## 2019-08-09 NOTE — Telephone Encounter (Signed)
Pt called and said she needs a referral to her old Actor. She saw Dr. Dorisann Frames in [place]. She called them and they told her since it's been so long she would need a referral.

## 2019-08-12 ENCOUNTER — Other Ambulatory Visit: Payer: Self-pay | Admitting: Family Medicine

## 2019-08-14 ENCOUNTER — Telehealth: Payer: Self-pay | Admitting: Cardiology

## 2019-08-14 NOTE — Addendum Note (Signed)
Addended by: Hannah Beat on: 08/14/2019 08:10 AM   Modules accepted: Orders

## 2019-08-14 NOTE — Telephone Encounter (Addendum)
Monitor downloaded and assigned to Dr. Azucena Cecil to read. Patient's echo appt is 08/1719 and follow up is 08/31/19.  Routing to Dr. Azucena Cecil for review of the monitor report as uploaded into Epic.

## 2019-08-14 NOTE — Telephone Encounter (Signed)
iRhythm calling with results: No nurse was available  SVT at 218 bpm/60 seconds  Found on strip 5. April 13 th at 9:26 pm  Patient had 6 runs of SVT. Longest run was 41 minutes and 53 seconds max rate 250 bpm  [phone] is call back Patient Ref # [id number]

## 2019-08-15 ENCOUNTER — Telehealth: Payer: Self-pay | Admitting: *Deleted

## 2019-08-15 DIAGNOSIS — I471 Supraventricular tachycardia: Secondary | ICD-10-CM

## 2019-08-15 NOTE — Telephone Encounter (Signed)
-----   Message from Debbe Odea, MD sent at 08/15/2019  7:24 AM EDT ----- VT noted on cardiac monitor.  Continue beta-blocker/metoprolol as prescribed.  Please refer patient to electrophysiology/Dr. Graciela Husbands.  Have patient keep appointment for echocardiogram and follow-up visit with myself.  Thank you

## 2019-08-15 NOTE — Telephone Encounter (Signed)
No answer. Left message to call back.   

## 2019-08-16 NOTE — Telephone Encounter (Signed)
No answer. Left message to call back.   

## 2019-08-17 ENCOUNTER — Encounter: Payer: Self-pay | Admitting: *Deleted

## 2019-08-17 NOTE — Telephone Encounter (Signed)
Patient returning call.

## 2019-08-17 NOTE — Telephone Encounter (Signed)
Results called to pt. Pt verbalized understanding of results and plan of care for referral.

## 2019-08-17 NOTE — Telephone Encounter (Signed)
3rd attempt to reach patient. No answer. Left message to call back.  Letter mailed to contact office.   Attempted to call patient's daughter. No answer. Left message to call back.

## 2019-08-20 ENCOUNTER — Other Ambulatory Visit: Payer: Self-pay | Admitting: Family Medicine

## 2019-08-27 ENCOUNTER — Other Ambulatory Visit: Payer: Self-pay

## 2019-08-27 ENCOUNTER — Ambulatory Visit (INDEPENDENT_AMBULATORY_CARE_PROVIDER_SITE_OTHER): Payer: No Typology Code available for payment source

## 2019-08-27 DIAGNOSIS — R55 Syncope and collapse: Secondary | ICD-10-CM
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
## 2019-08-31 ENCOUNTER — Encounter: Payer: Self-pay | Admitting: Cardiology

## 2019-08-31 ENCOUNTER — Other Ambulatory Visit: Payer: Self-pay

## 2019-08-31 ENCOUNTER — Ambulatory Visit (INDEPENDENT_AMBULATORY_CARE_PROVIDER_SITE_OTHER): Payer: No Typology Code available for payment source | Admitting: Cardiology

## 2019-08-31 VITALS — BP 148/82 | HR 69 | Ht 68.0 in | Wt 249.2 lb

## 2019-08-31 DIAGNOSIS — I471 Supraventricular tachycardia: Secondary | ICD-10-CM | POA: Diagnosis not present

## 2019-08-31 DIAGNOSIS — R55 Syncope and collapse: Secondary | ICD-10-CM | POA: Diagnosis not present

## 2019-08-31 DIAGNOSIS — I10 Essential (primary) hypertension: Secondary | ICD-10-CM | POA: Diagnosis not present

## 2019-08-31 DIAGNOSIS — R252 Cramp and spasm: Secondary | ICD-10-CM

## 2019-08-31 MED ORDER — LOSARTAN POTASSIUM 50 MG PO TABS: 50.0000 mg | ORAL_TABLET | Freq: Two times a day (BID) | ORAL | 3 refills | Status: DC

## 2019-08-31 MED ORDER — METOPROLOL TARTRATE 100 MG PO TABS: 100.0000 mg | ORAL_TABLET | Freq: Two times a day (BID) | ORAL | 3 refills | Status: DC

## 2019-08-31 NOTE — Patient Instructions (Signed)
Medication Instructions:  Your physician has recommended you make the following change in your medication:  1.   Increase your Lopressor (Metoprolol) to 100mg , take 1 tablet by mouth twice a day. 2.   Start Losartan 50mg . Take 1 tablet by mouth twice a day. 3.   Stop taking your HCTZ.  *If you need a refill on your cardiac medications before your next appointment, please call your pharmacy*   Lab Work: Your physician recommends that you have lab work today: BMP.  If you have labs (blood work) drawn today and your tests are completely normal, you will receive your results only by: Marland Kitchen MyChart Message (if you have MyChart) OR . A paper copy in the mail If you have any lab test that is abnormal or we need to change your treatment, we will call you to review the results.   Testing/Procedures: None Ordered   Follow-Up: At [facility], you and your health needs are our priority.  As part of our continuing mission to provide you with exceptional heart care, we have created designated Provider Care Teams.  These Care Teams include your primary Cardiologist (physician) and Advanced Practice Providers (APPs -  Physician Assistants and Nurse Practitioners) who all work together to provide you with the care you need, when you need it.  We recommend signing up for the patient portal called "MyChart".  Sign up information is provided on this After Visit Summary.  MyChart is used to connect with patients for Virtual Visits (Telemedicine).  Patients are able to view lab/test results, encounter notes, upcoming appointments, etc.  Non-urgent messages can be sent to your provider as well.   To learn more about what you can do with MyChart, go to [email or web address].    Your next appointment:   1 month(s)  The format for your next appointment:   In Person  Provider:   Kate Sable, MD   Other Instructions  Losartan Tablets What is this medicine? LOSARTAN (loe SAR tan) is an  angiotensin II receptor blocker, also known as an ARB. It treats high blood pressure. It can slow kidney damage in some patients. It may also be used to lower the risk of stroke. This medicine may be used for other purposes; ask your health care provider or pharmacist if you have questions. COMMON BRAND NAME(S): Cozaar What should I tell my health care provider before I take this medicine? They need to know if you have any of these conditions:  heart failure  kidney or liver disease  an unusual or allergic reaction to losartan, other medicines, foods, dyes, or preservatives  pregnant or trying to get pregnant  breast-feeding How should I use this medicine? Take this drug by mouth. Take it as directed on the prescription label at the same time every day. You can take it with or without food. If it upsets your stomach, take it with food. Keep taking it unless your health care provider tells you to stop. Talk to your health care provider about the use of this drug in children. While it may be prescribed for children as young as 6 for selected conditions, precautions do apply. Overdosage: If you think you have taken too much of this medicine contact a poison control center or emergency room at once. NOTE: This medicine is only for you. Do not share this medicine with others. What if I miss a dose? If you miss a dose, take it as soon as you can. If it is almost time for your  next dose, take only that dose. Do not take double or extra doses. What may interact with this medicine?  blood pressure medicines  diuretics, especially triamterene, spironolactone, or amiloride  fluconazole  NSAIDs, medicines for pain and inflammation, like ibuprofen or naproxen  potassium salts or potassium supplements  rifampin This list may not describe all possible interactions. Give your health care provider a list of all the medicines, herbs, non-prescription drugs, or dietary supplements you use. Also tell  them if you smoke, drink alcohol, or use illegal drugs. Some items may interact with your medicine. What should I watch for while using this medicine? Visit your doctor or health care professional for regular checks on your progress. Check your blood pressure as directed. Ask your doctor or health care professional what your blood pressure should be and when you should contact him or her. Call your doctor or health care professional if you notice an irregular or fast heart beat. Women should inform their doctor if they wish to become pregnant or think they might be pregnant. There is a potential for serious side effects to an unborn child, particularly in the second or third trimester. Talk to your health care professional or pharmacist for more information. You may get drowsy or dizzy. Do not drive, use machinery, or do anything that needs mental alertness until you know how this drug affects you. Do not stand or sit up quickly, especially if you are an older patient. This reduces the risk of dizzy or fainting spells. Alcohol can make you more drowsy and dizzy. Avoid alcoholic drinks. Avoid salt substitutes unless you are told otherwise by your doctor or health care professional. Do not treat yourself for coughs, colds, or pain while you are taking this medicine without asking your doctor or health care professional for advice. Some ingredients may increase your blood pressure. What side effects may I notice from receiving this medicine? Side effects that you should report to your doctor or health care professional as soon as possible:  confusion, dizziness, light headedness or fainting spells  decreased amount of urine passed  difficulty breathing or swallowing, hoarseness, or tightening of the throat  fast or irregular heart beat, palpitations, or chest pain  skin rash, itching  swelling of your face, lips, tongue, hands, or feet Side effects that usually do not require medical attention  (report to your doctor or health care professional if they continue or are bothersome):  cough  decreased sexual function or desire  headache  nasal congestion or stuffiness  nausea or stomach pain  sore or cramping muscles This list may not describe all possible side effects. Call your doctor for medical advice about side effects. You may report side effects to FDA at 1-800-FDA-1088. Where should I keep my medicine? Keep out of the reach of children and pets. Store at room temperature between 15 and 30 degrees C (59 and 86 degrees F). Protect from light. Keep the container tightly closed. Throw away any unused drug after the expiration date. NOTE: This sheet is a summary. It may not cover all possible information. If you have questions about this medicine, talk to your doctor, pharmacist, or health care provider.  2020 Elsevier/Gold Standard (2018-11-01 12:12:28)
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
## 2019-09-01 LAB — BASIC METABOLIC PANEL
BUN/Creatinine Ratio: 15 (ref 12–28)
BUN: 11 mg/dL (ref 8–27)
CO2: 25 mmol/L (ref 20–29)
Calcium: 9.1 mg/dL (ref 8.7–10.3)
Chloride: 104 mmol/L (ref 96–106)
Creatinine, Ser: 0.73 mg/dL (ref 0.57–1.00)
GFR calc Af Amer: 104 mL/min/{1.73_m2} (ref >59)
GFR calc non Af Amer: 90 mL/min/{1.73_m2} (ref >59)
Glucose: 112 mg/dL — ABNORMAL HIGH (ref 65–99)
Potassium: 3.5 mmol/L (ref 3.5–5.2)
Sodium: 142 mmol/L (ref 134–144)

## 2019-09-03 ENCOUNTER — Telehealth: Payer: Self-pay | Admitting: Cardiology

## 2019-09-03 NOTE — Telephone Encounter (Signed)
Spoke with the patient. Patient sts that she check her BP regularly. Patient sts that her BP has been running in the 50's. She is taking Metoprolol 100 mg bid as prescribed. Advised the patient that HR in the 50's can be expected since she is on a BB. The BP reading provided was prior to her medications. Advised the patient that she should be checking her BP 2-3 hours after her medications. Advised the patient to continue to monitor her BP daily and to contact the office if her readings are consistently elevated.  Patient also reports some fatigue. Advised her that could also be expected and should improve over the next couple of days. Advised her to contact the office if the fatigue does not improve or she feels she is not able to tolerate.  Patient is agreeable with the plan and voiced appreciation for the call back.

## 2019-09-03 NOTE — Telephone Encounter (Signed)
Pt c/o medication issue:  1. Name of Medication: metoprolol 100 mg bid and losartan(recently added 50 mg bid)  2. How are you currently taking this medication (dosage and times per day)?   3. Are you having a reaction (difficulty breathing--STAT)?   4. What is your medication issue? Heart rate in 50's and BP 162/101. Patient curious to know if she should continue taking both medications. Some lightheadedness and things "seem slow motion"

## 2019-10-04 ENCOUNTER — Institutional Professional Consult (permissible substitution): Payer: No Typology Code available for payment source | Admitting: Internal Medicine

## 2019-10-09 ENCOUNTER — Other Ambulatory Visit: Payer: Self-pay

## 2019-10-09 ENCOUNTER — Ambulatory Visit (INDEPENDENT_AMBULATORY_CARE_PROVIDER_SITE_OTHER): Payer: No Typology Code available for payment source | Admitting: Cardiology

## 2019-10-09 ENCOUNTER — Encounter: Payer: Self-pay | Admitting: Cardiology

## 2019-10-09 VITALS — BP 130/82 | HR 66 | Ht 68.0 in | Wt 268.4 lb

## 2019-10-09 DIAGNOSIS — I471 Supraventricular tachycardia: Secondary | ICD-10-CM | POA: Diagnosis not present

## 2019-10-09 DIAGNOSIS — I10 Essential (primary) hypertension: Secondary | ICD-10-CM | POA: Diagnosis not present

## 2019-10-09 MED ORDER — LOSARTAN POTASSIUM 50 MG PO TABS
50.0000 mg | ORAL_TABLET | Freq: Every day | ORAL | 6 refills | Status: DC
Start: 2019-10-09 — End: 2019-12-03
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
## 2019-10-09 NOTE — Patient Instructions (Signed)
Medication Instructions:   DECREASE your Losartan: Take 1 tablet (50 mg total) by mouth daily.  *If you need a refill on your cardiac medications before your next appointment, please call your pharmacy*   Lab Work: None Ordered If you have labs (blood work) drawn today and your tests are completely normal, you will receive your results only by:  MyChart Message (if you have MyChart) OR  A paper copy in the mail If you have any lab test that is abnormal or we need to change your treatment, we will call you to review the results.   Testing/Procedures: None Ordered   Follow-Up: At [facility], you and your health needs are our priority.  As part of our continuing mission to provide you with exceptional heart care, we have created designated Provider Care Teams.  These Care Teams include your primary Cardiologist (physician) and Advanced Practice Providers (APPs -  Physician Assistants and Nurse Practitioners) who all work together to provide you with the care you need, when you need it.  We recommend signing up for the patient portal called "MyChart".  Sign up information is provided on this After Visit Summary.  MyChart is used to connect with patients for Virtual Visits (Telemedicine).  Patients are able to view lab/test results, encounter notes, upcoming appointments, etc.  Non-urgent messages can be sent to your provider as well.   To learn more about what you can do with MyChart, go to [email or web address].    Your next appointment:   3 month(s)  The format for your next appointment:   In Person  Provider:   Debbe Odea, MD   Other Instructions N/A

## 2019-11-29 ENCOUNTER — Telehealth: Payer: Self-pay | Admitting: Cardiology

## 2019-11-29 NOTE — Telephone Encounter (Signed)
Call to patient to discuss.    Pt reports HTN over the past week.  Has been taking additional 0.5 tab Losartan in the afternoon as needed which gives some inprovement.  Posterior headache and seeing spot.   Pt does admit to eating more salt than she should and will cut back.   Denies increase in stress but is typically highest at work.   BP midday yesterday 181/104, after full tablet of additional losartan BP was 143/94 with some improvement in BP.   BP this morning 169/98.    Routing to MD to advise.

## 2019-11-29 NOTE — Telephone Encounter (Signed)
Pt c/o BP issue: STAT if pt c/o blurred vision, one-sided weakness or slurred speech  1. What are your last 5 BP readings?   181/104 yesterday    2. Are you having any other symptoms (ex. Dizziness, headache, blurred vision, passed out)?   Feels like someone hit her in the back of the head seeing spots in front of eyes feels like she may pass out   3. What is your BP issue? Continues htn patient not sure if meds are working wants to discuss

## 2019-11-30 NOTE — Telephone Encounter (Signed)
163/96   186/95  After medication   Patient wants to know if she should take additional medication

## 2019-11-30 NOTE — Telephone Encounter (Addendum)
Spoke with the patient. Patient sts that she is taking Metoprolol 100 mg bid and Losartan 50 mg qd.  She works 3rd shift and takes her medications 1st dose at 1 pm and the second around 10 pm.  Patient has been monitoring her BP 3-4 hours after her medications. Her BPs have been consistently elevated 160s/180s/90s. Patient has been taking an additional 1/2 tablet 25mg  daily prn of losartan if her elevated BPs is associated with a headache.  I asked her to recheck her BP while I held the line 172/94 63 bpm. I Instructed her to take an additional 25mg  of losartan today.  She does primarily eat out due to her work schedule. Encouraged her to limit her sodium intake. Adv the patient that Dr. is out of the office and will return on Monday 12/03/19. Adv she can contact our office over the weekend and speak to an on call provider if assistance is needed. I will fwd the msg to him for further recommendation. Patient verbalized understanding and voiced appreciation for the call.

## 2019-12-03 MED ORDER — LOSARTAN POTASSIUM 50 MG PO TABS: 50.0000 mg | ORAL_TABLET | Freq: Two times a day (BID) | ORAL | 0 refills | Status: DC

## 2019-12-03 NOTE — Telephone Encounter (Signed)
Called patient and gave her Dr. Merita Norton recommendation as noted below.  Patient will also check BP daily, and notify us if it is remaining to stay elevated.  Patient verbalized understanding and agreed with plan.

## 2019-12-03 NOTE — Telephone Encounter (Signed)
Debbe Odea, MD  Gibson Ramp, RN; Jarvis Newcomer, RN 57 minutes ago (1:14 PM)   Please increase losartan to 50 mg twice daily due to elevated blood pressures. Thank you     Patient verbalized understanding. She will continue to monitor and bring the readings to her next appointment on Thursday. Med list updated.  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 I encouraged her to find ways to decrease stress if possible.

## 2019-12-06 ENCOUNTER — Ambulatory Visit (INDEPENDENT_AMBULATORY_CARE_PROVIDER_SITE_OTHER): Payer: No Typology Code available for payment source | Admitting: Internal Medicine

## 2019-12-06 ENCOUNTER — Encounter: Payer: Self-pay | Admitting: Internal Medicine

## 2019-12-06 ENCOUNTER — Other Ambulatory Visit: Payer: Self-pay

## 2019-12-06 VITALS — BP 130/78 | HR 59 | Ht 68.0 in | Wt 247.0 lb

## 2019-12-06 DIAGNOSIS — I10 Essential (primary) hypertension: Secondary | ICD-10-CM

## 2019-12-06 DIAGNOSIS — I471 Supraventricular tachycardia: Secondary | ICD-10-CM

## 2019-12-06 NOTE — Progress Notes (Signed)
ELECTROPHYSIOLOGY CONSULT NOTE  Patient ID: Annette Romero, MRN: [id number], DOB/AGE: 1960-12-21 60 y.o. Admit date: (Not on file) Date of Consult: 12/06/2019  Primary Physician: Hannah Beat, MD Primary Cardiologist: BAE     Annette Romero is a 59 y.o. female who is being seen today for the evaluation of SVT at the request of BAE.    HPI Annette Romero is a 59 y.o. female has a 30-40-year history of recurrent abrupt onset offset tachypalpitations without specific triggers.  Associated with lightheadedness and shortness of breath.  They are frog negative and diuretic negative.  Increasingly frequent of late.  No occurring about once a month.  Lasting up to 30-40 minutes.  Previously but no longer easily terminated with Valsalva.  Has been treated with increasing doses of beta-blocker and now taking 100 mg of metoprolol twice daily with continued episodes  Problems recently with poorly controlled hypertension.  Has sleep disordered breathing  DATE TEST EF   5/21 Echo   60-65 %         Date Cr K Hgb  5/21 0.73 3.5 12.5(12/20) next    History of previously treated hypothyroidism  Past Medical History:  Diagnosis Date  . GERD (gastroesophageal reflux disease)   . Heart murmur   . Hypertension   . Hyperthyroidism   . Iron deficiency anemia   . Tachycardia    with increased thyroid level  . Vitamin D deficiency       Surgical History:  Past Surgical History:  Procedure Laterality Date  . ABDOMINAL HYSTERECTOMY    . BREAST BIOPSY  1974   breast cyst  . BREAST CYST ASPIRATION    . BREAST EXCISIONAL BIOPSY Left    at age of 65  . CESAREAN SECTION  1991  . HARDWARE REMOVAL Right 04/28/2017   Procedure: HARDWARE REMOVAL-RIGHT ANKLE;  Surgeon: Kennedy Bucker, MD;  Location: [facility] ORS;  Service: Orthopedics;  Laterality: Right;  . ORIF ANKLE FRACTURE Right 04/30/2016   Procedure: OPEN REDUCTION INTERNAL FIXATION (ORIF) ANKLE FRACTURE;  Surgeon: Kennedy Bucker, MD;  Location:  [facility] ORS;  Service: Orthopedics;  Laterality: Right;  Marland Kitchen VAGINAL HYSTERECTOMY  2001   heavy bleeding due to fibroids, not cancer     Home Meds: Current Meds  Medication Sig  . aspirin EC 81 MG tablet Take 81 mg by mouth daily.  . Calcium Carb-Cholecalciferol (CALCIUM-VITAMIN D3) 600-400 MG-UNIT CAPS Take 1 capsule by mouth daily.  . ferrous sulfate 325 (65 FE) MG tablet Take 325 mg by mouth 2 (two) times daily with a meal.  . Ginkgo Biloba 40 MG TABS Take 2 tablets by mouth daily.  Marland Kitchen glucosamine-chondroitin 500-400 MG tablet Take 1 tablet by mouth 3 (three) times daily.  Marland Kitchen losartan (COZAAR) 50 MG tablet Take 1 tablet (50 mg total) by mouth in the morning and at bedtime. (Patient taking differently: Take 50 mg by mouth in the morning and at bedtime. 1 tab in the am, 1 tab in the pm (100mg  total))  . meloxicam (MOBIC) 15 MG tablet Take 1 tablet (15 mg total) by mouth daily. (Patient taking differently: Take 15 mg by mouth as needed. )  . metoprolol tartrate (LOPRESSOR) 100 MG tablet Take 1 tablet (100 mg total) by mouth 2 (two) times daily.  . Misc Natural Products (HIMALAYAN GOJI PO) Take by mouth daily.  OVER THE COUNTER MEDICATION Goli Apple Cider Gummy-2 gummy daily  . rosuvastatin (CRESTOR) 20 MG tablet TAKE ONE TABLET ONCE DAILY BY  MOUTH   NOTE DOSE INCREASE  . SUMAtriptan (IMITREX) 50 MG tablet Take 1 tablet (50 mg total) by mouth every 2 (two) hours as needed for migraine or headache.  . traZODone (DESYREL) 150 MG tablet TAKE 1 TABLET (150 MG TOTAL) BY MOUTH AT BEDTIME AS NEEDED FOR SLEEP.  Marland Kitchen TURMERIC PO Take 2 tablets by mouth daily.  . vitamin E 1000 UNIT capsule Take 2,000 Units by mouth daily.    Allergies:  Allergies  Allergen Reactions  . Zantac [Ranitidine Hcl] Rash    Social History   Socioeconomic History  . Marital status: Married    Spouse name: Not on file  . Number of children: 2  . Years of education: Not on file  . Highest education level: Not on file    Occupational History  . 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 Smoking status: Never Smoker  . Smokeless tobacco: Never Used  Vaping Use  . Vaping Use: Never used  Substance and Sexual Activity  . Alcohol use: No  . Drug use: No  . Sexual activity: Not on file  Other Topics Concern  . Not on file  Social History Narrative   Nurse at [employer]   Separated   2 children   Son, 43, lives in [place], [address], [place]. [place]   Social Determinants of Health   Financial Resource Strain:   . Difficulty of Paying Living Expenses: Not on file  Food Insecurity:   . Worried About Programme researcher, broadcasting/film/video in the Last Year: Not on file  . Ran Out of Food in the Last Year: Not on file  Transportation Needs:   . Lack of Transportation (Medical): Not on file  . Lack of Transportation (Non-Medical): Not on file  Physical Activity:   . Days of Exercise per Week: Not on file  . Minutes of Exercise per Session: Not on file  Stress:   . Feeling of Stress : Not on file  Social Connections:   . Frequency of Communication with Friends and Family: Not on file  . Frequency of Social Gatherings with Friends and Family: Not on file  . Attends Religious Services: Not on file  . Active Member of Clubs or Organizations: Not on file  . Attends Banker Meetings: Not on file  . Marital Status: Not on file  Intimate Partner Violence:   . Fear of Current or Ex-Partner: Not on file  . Emotionally Abused: Not on file  . Physically Abused: Not on file  . Sexually Abused: Not on file     Family History  Problem Relation Age of Onset  . CVA Father   . Heart attack Father   . Heart failure Father   . Seizures Mother   . Drug abuse Brother        d/c from drugs  . Breast cancer Neg Hx      ROS:  Please see the history of present illness.     All other systems reviewed and negative.    Physical Exam:  Blood pressure 130/78, pulse (!) 59, height 5\' 8"   (1.727 m), weight 247 lb (112 kg), SpO2 100 %. General: Well developed, well nourished female in no acute distress. Head: Normocephalic, atraumatic, sclera non-icteric, no xanthomas, nares are without discharge. EENT: normal  Lymph Nodes:  none Neck: Negative for carotid bruits. JVD not elevated. Back:without scoliosis kyphosis Lungs: Clear bilaterally to auscultation without wheezes,  rales, or rhonchi. Breathing is unlabored. Heart: RRR with S1 S2. No murmur . No rubs, or gallops appreciated. Abdomen: Soft, non-tender, non-distended with normoactive bowel sounds. No hepatomegaly. No rebound/guarding. No obvious abdominal masses. Msk:  Strength and tone appear normal for age. Extremities: No clubbing or cyanosis. No edema.  Distal pedal pulses are 2+ and equal bilaterally. Skin: Warm and Dry Neuro: Alert and oriented X 3. CN III-XII intact Grossly normal sensory and motor function . Psych:  Responds to questions appropriately with a normal affect.      Labs: Cardiac Enzymes No results for input(s): CKTOTAL, CKMB, TROPONINI in the last 72 hours. CBC Lab Results  Component Value Date   WBC 6.5 03/30/2019   HGB 12.5 03/30/2019   HCT 38.0 03/30/2019   MCV 80.4 03/30/2019   PLT 230.0 03/30/2019   PROTIME: No results for input(s): LABPROT, INR in the last 72 hours. Chemistry No results for input(s): NA, K, CL, CO2, BUN, CREATININE, CALCIUM, PROT, BILITOT, ALKPHOS, ALT, AST, GLUCOSE in the last 168 hours.  Invalid input(s): LABALBU Lipids Lab Results  Component Value Date   CHOL 209 (H) 03/30/2019   HDL 40.80 03/30/2019   LDLCALC 129 (H) 03/30/2019   TRIG 195.0 (H) 03/30/2019   BNP No results found for: PROBNP Thyroid Function Tests: No results for input(s): TSH, T4TOTAL, T3FREE, THYROIDAB in the last 72 hours.  Invalid input(s): FREET3 Miscellaneous No results found for: DDIMER  Radiology/Studies:  No results found.  EKG: Sinus rhythm at 59 Intervals 18/09/46 No  evidence of ventricular preexcitation   Assessment and Plan:  SVT probably AV reentry  Hypertension  Sleep disordered breathing     The patient has recurrent and life disrupting SVT.  Suspect AV reentry.  Have discussed treatment options including ongoing therapy with beta-blockers alternative treatment with calcium blockers and/or catheter ablation.  We have discussed risks and benefits including but not limited to death, perforation and heart block requiring pacing.  She is eager to pursue catheter ablation. Discussed that it could potentially be left-sided; access would inform how much time off from work she would need.  If it were left-sided she might need as many as 5 or 6 days   We will have Dr. Lubertha Basque office contact her and come up with a plan.  In addition, have recommended that she undergo a sleep study.  This may also help blood pressure control.      Sherryl Manges

## 2019-12-06 NOTE — Patient Instructions (Signed)
Medication Instructions:  - Your physician recommends that you continue on your current medications as directed. Please refer to the Current Medication list given to you today.  *If you need a refill on your cardiac medications before your next appointment, please call your pharmacy*   Lab Work: - none ordered  If you have labs (blood work) drawn today and your tests are completely normal, you will receive your results only by: Marland Kitchen MyChart Message (if you have MyChart) OR . A paper copy in the mail If you have any lab test that is abnormal or we need to change your treatment, we will call you to review the results.   Testing/Procedures: - Your physician has recommended that you have an SVT ablation. Catheter ablation is a medical procedure used to treat some cardiac arrhythmias (irregular heartbeats). During catheter ablation, a long, thin, flexible tube is put into a blood vessel in your groin (upper thigh), or neck. This tube is called an ablation catheter. It is then guided to your heart through the blood vessel. Radio frequency waves destroy small areas of heart tissue where abnormal heartbeats may cause an arrhythmia to start. Please see the instruction sheet given to you today.  - We will have Dr. Olga Millers nurse reach out to you to schedule this procedure Ancil Boozer, RN)    Follow-Up: At [facility], you and your health needs are our priority.  As part of our continuing mission to provide you with exceptional heart care, we have created designated Provider Care Teams.  These Care Teams include your primary Cardiologist (physician) and Advanced Practice Providers (APPs -  Physician Assistants and Nurse Practitioners) who all work together to provide you with the care you need, when you need it.  We recommend signing up for the patient portal called "MyChart".  Sign up information is provided on this After Visit Summary.  MyChart is used to connect with patients for Virtual  Visits (Telemedicine).  Patients are able to view lab/test results, encounter notes, upcoming appointments, etc.  Non-urgent messages can be sent to your provider as well.   To learn more about what you can do with MyChart, go to [email or web address].    Your next appointment:   Pending procedure date   The format for your next appointment:   In Person  Provider:   Lewayne Bunting, MD   Other Instructions N/a   Supraventricular Tachycardia, Adult Supraventricular tachycardia (SVT) is a kind of abnormal heartbeat. It makes your heart beat very fast and then beat at a normal speed. A normal resting heartbeat is 60-100 times a minute. This condition can make your heart beat more than 150 times a minute. Times of having a fast heartbeat (episodes) can be scary, but they are usually not dangerous. In some cases, they may lead to heart failure if:  They happen many times per day.  Last longer than a few seconds. What are the causes?  A normal heartbeat starts when an area called the sinoatrial node sends out an electrical signal. In SVT, other areas of the heart send out signals that get in the way of the signal from the sinoatrial node. What increases the risk? You are more likely to develop this condition if you are:  78-59 years old.  A woman. The following factors may make you more likely to develop this condition:  Stress.  Tiredness.  Smoking.  Stimulant drugs, such as cocaine and methamphetamine.  Alcohol.  Caffeine.  Pregnancy.  Feeling worried or  nervous (anxiety). What are the signs or symptoms?  A pounding heart.  A feeling that your heart is skipping beats (palpitations).  Weakness.  Trouble getting enough air.  Pain or tightness in your chest.  Feeling like you are going to pass out (faint).  Feeling worried or nervous.  Dizziness.  Sweating.  Feeling sick to your stomach (nausea).  Passing out.  Tiredness. Sometimes, there are no  symptoms. How is this treated?  Vagal nerve stimulation. Ways to do this include: ? Holding your breath and pushing, as though you are pooping (having a bowel movement). ? Massaging an area on one side of your neck. Do not try this yourself. Only a doctor should do this. If done the wrong way, it can lead to a stroke. ? Bending forward with your head between your legs. ? Coughing while bending forward with your head between your legs. ? Closing your eyes and massaging your eyeballs. Ask a doctor how to do this.  Medicines that prevent attacks.  Medicine to stop an attack given through an IV tube at the hospital.  A small electric shock (cardioversion) that stops an attack.  Radiofrequency ablation. In this procedure, a small, thin tube (catheter) is used to send energy to the area that is causing the rapid heartbeats. If you do not have symptoms, you may not need treatment. Follow these instructions at home: Stress  Avoid things that make you feel stressed.  To deal with stress, try: ? Doing yoga or meditation, or being out in nature. ? Listening to relaxing music. ? Doing deep breathing. ? Taking steps to be healthy, such as getting lots of sleep, exercising, and eating a balanced diet. ? Talking with a mental health doctor. Lifestyle   Try to get at least 7 hours of sleep each night.  Do not use any products that contain nicotine or tobacco, such as cigarettes, e-cigarettes, and chewing tobacco. If you need help quitting, ask your doctor.  Be aware of how alcohol affects you. ? If alcohol gives you a fast heartbeat, do not drink alcohol. ? If alcohol does not seem to give you a fast heartbeat, limit alcohol use to no more than 1 drink a day for women who are not pregnant, and 2 drinks a day for men. In the [place] one drink is one of these:  12 oz of beer (355 mL).  5 oz of wine (148 mL).  1 oz of hard liquor (44 mL).  Be aware of how caffeine affects you. ? If  caffeine gives you a fast heartbeat, do not eat, drink, or use anything with caffeine in it. ? If caffeine does not seem to give you a fast heartbeat, limit how much caffeine you eat, drink, or use.  Do not use stimulant drugs. If you need help quitting, ask your doctor. General instructions  Stay at a healthy weight.  Exercise regularly. Ask your doctor about good activities for you. Try one or a mixture of these: ? 150 minutes a week of gentle exercise, like walking or yoga. ? 75 minutes a week of exercise that is very active, like running or swimming.  Do vagus nerve treatments to slow down your heartbeat as told by your doctor.  Take over-the-counter and prescription medicines only as told by your doctor.  Keep all follow-up visits as told by your doctor. This is important. Contact a doctor if:  You have a fast heartbeat more often.  Times of having a fast heartbeat last  longer than before.  Home treatments to slow down your heartbeat do not help.  You have new symptoms. Get help right away if:  You have chest pain.  Your symptoms get worse.  You have trouble breathing.  Your heart beats very fast for more than 20 minutes.  You pass out. These symptoms may be an emergency. Do not wait to see if the symptoms will go away. Get medical help right away. Call your local emergency services (911 in the [place] Do not drive yourself to the hospital. Summary  SVT is a type of abnormal heart beat.  This condition can make your heart beat more than 150 times a minute.  Treatment depends on how often the condition happens and your symptoms. This information is not intended to replace advice given to you by your health care provider. Make sure you discuss any questions you have with your health care provider. Document Revised: 02/14/2018 Document Reviewed: 02/14/2018 Elsevier Patient Education  2020 Elsevier Inc.    Cardiac Ablation Cardiac ablation is a procedure to  disable (ablate) a small amount of heart tissue in very specific places. The heart has many electrical connections. Sometimes these connections are abnormal and can cause the heart to beat very fast or irregularly. Ablating some of the problem areas can improve the heart rhythm or return it to normal. Ablation may be done for people who:  Have Wolff-Parkinson-White syndrome.  Have fast heart rhythms (tachycardia).  Have taken medicines for an abnormal heart rhythm (arrhythmia) that were not effective or caused side effects.  Have a high-risk heartbeat that may be life-threatening. During the procedure, a small incision is made in the neck or the groin, and a long, thin, flexible tube (catheter) is inserted into the incision and moved to the heart. Small devices (electrodes) on the tip of the catheter will send out electrical currents. A type of X-ray (fluoroscopy) will be used to help guide the catheter and to provide images of the heart. Tell a health care provider about:  Any allergies you have.  All medicines you are taking, including vitamins, herbs, eye drops, creams, and over-the-counter medicines.  Any problems you or family members have had with anesthetic medicines.  Any blood disorders you have.  Any surgeries you have had.  Any medical conditions you have, such as kidney failure.  Whether you are pregnant or may be pregnant. What are the risks? Generally, this is a safe procedure. However, problems may occur, including:  Infection.  Bruising and bleeding at the catheter insertion site.  Bleeding into the chest, especially into the sac that surrounds the heart. This is a serious complication.  Stroke or blood clots.  Damage to other structures or organs.  Allergic reaction to medicines or dyes.  Need for a permanent pacemaker if the normal electrical system is damaged. A pacemaker is a small computer that sends electrical signals to the heart and helps your heart  beat normally.  The procedure not being fully effective. This may not be recognized until months later. Repeat ablation procedures are sometimes required. What happens before the procedure?  Follow instructions from your health care provider about eating or drinking restrictions.  Ask your health care provider about: ? Changing or stopping your regular medicines. This is especially important if you are taking diabetes medicines or blood thinners. ? Taking medicines such as aspirin and ibuprofen. These medicines can thin your blood. Do not take these medicines before your procedure if your health care provider instructs  you not to.  Plan to have someone take you home from the hospital or clinic.  If you will be going home right after the procedure, plan to have someone with you for 24 hours. What happens during the procedure?  To lower your risk of infection: ? Your health care team will wash or sanitize their hands. ? Your skin will be washed with soap. ? Hair may be removed from the incision area.  An IV tube will be inserted into one of your veins.  You will be given a medicine to help you relax (sedative).  The skin on your neck or groin will be numbed.  An incision will be made in your neck or your groin.  A needle will be inserted through the incision and into a large vein in your neck or groin.  A catheter will be inserted into the needle and moved to your heart.  Dye may be injected through the catheter to help your surgeon see the area of the heart that needs treatment.  Electrical currents will be sent from the catheter to ablate heart tissue in desired areas. There are three types of energy that may be used to ablate heart tissue: ? Heat (radiofrequency energy). ? Laser energy. ? Extreme cold (cryoablation).  When the necessary tissue has been ablated, the catheter will be removed.  Pressure will be held on the catheter insertion area to prevent excessive  bleeding.  A bandage (dressing) will be placed over the catheter insertion area. The procedure may vary among health care providers and hospitals. What happens after the procedure?  Your blood pressure, heart rate, breathing rate, and blood oxygen level will be monitored until the medicines you were given have worn off.  Your catheter insertion area will be monitored for bleeding. You will need to lie still for a few hours to ensure that you do not bleed from the catheter insertion area.  Do not drive for 24 hours or as long as directed by your health care provider. Summary  Cardiac ablation is a procedure to disable (ablate) a small amount of heart tissue in very specific places. Ablating some of the problem areas can improve the heart rhythm or return it to normal.  During the procedure, electrical currents will be sent from the catheter to ablate heart tissue in desired areas. This information is not intended to replace advice given to you by your health care provider. Make sure you discuss any questions you have with your health care provider. Document Revised: 09/19/2017 Document Reviewed: 02/16/2016 Elsevier Patient Education  2020 [employer].

## 2019-12-10 ENCOUNTER — Telehealth: Payer: Self-pay | Admitting: Family Medicine

## 2019-12-10 NOTE — Telephone Encounter (Signed)
Please schedule CPE with fasting labs prior with Dr. Patsy Lager for end of Septemeber.

## 2019-12-10 NOTE — Addendum Note (Signed)
Addended by: Thayer Headings, Stormey Wilborn L on: 12/10/2019 11:18 AM   Modules accepted: Orders

## 2019-12-12 ENCOUNTER — Telehealth: Payer: Self-pay

## 2019-12-12 DIAGNOSIS — I471 Supraventricular tachycardia: Secondary | ICD-10-CM

## 2019-12-12 NOTE — Telephone Encounter (Signed)
Tried call pt phone not working  Sent my chart message

## 2019-12-14 NOTE — Telephone Encounter (Signed)
Annette Romero is calling stating she would like to go ahead with scheduling the procedure without a follow up prior. She is requesting a call to discuss the dates and schedule the procedure. Please advise.

## 2019-12-18 NOTE — Telephone Encounter (Signed)
Call returned to Pt.  Pt scheduled for SVT ablation on 9/17  Will get labs/covid test at [facility]  Instruction letter created and mailed to Pt  Work up complete

## 2019-12-27 ENCOUNTER — Other Ambulatory Visit: Payer: Self-pay

## 2019-12-27 ENCOUNTER — Other Ambulatory Visit
Admission: RE | Admit: 2019-12-27 | Discharge: 2019-12-27 | Disposition: A | Discharge status: Home / Self Care | Payer: PRIVATE HEALTH INSURANCE | Source: Ambulatory Visit | Attending: Internal Medicine | Admitting: Internal Medicine

## 2019-12-27 ENCOUNTER — Emergency Department (HOSPITAL_COMMUNITY): Payer: PRIVATE HEALTH INSURANCE

## 2019-12-27 ENCOUNTER — Encounter (HOSPITAL_COMMUNITY): Payer: Self-pay

## 2019-12-27 ENCOUNTER — Telehealth: Payer: Self-pay | Admitting: Cardiology

## 2019-12-27 ENCOUNTER — Emergency Department (HOSPITAL_COMMUNITY)
Admission: EM | Admit: 2019-12-27 | Discharge: 2019-12-28 | Disposition: A | Discharge status: Home / Self Care | Payer: PRIVATE HEALTH INSURANCE | Source: Home / Self Care

## 2019-12-27 DIAGNOSIS — Z01812 Encounter for preprocedural laboratory examination: Secondary | ICD-10-CM | POA: Diagnosis present

## 2019-12-27 DIAGNOSIS — Z791 Long term (current) use of non-steroidal anti-inflammatories (NSAID): Secondary | ICD-10-CM | POA: Diagnosis not present

## 2019-12-27 DIAGNOSIS — Z5321 Procedure and treatment not carried out due to patient leaving prior to being seen by health care provider: Secondary | ICD-10-CM | POA: Insufficient documentation

## 2019-12-27 DIAGNOSIS — I471 Supraventricular tachycardia: Secondary | ICD-10-CM | POA: Insufficient documentation

## 2019-12-27 DIAGNOSIS — Z7982 Long term (current) use of aspirin: Secondary | ICD-10-CM | POA: Diagnosis not present

## 2019-12-27 DIAGNOSIS — K219 Gastro-esophageal reflux disease without esophagitis: Secondary | ICD-10-CM | POA: Diagnosis not present

## 2019-12-27 DIAGNOSIS — Z20822 Contact with and (suspected) exposure to covid-19: Secondary | ICD-10-CM | POA: Insufficient documentation

## 2019-12-27 DIAGNOSIS — D509 Iron deficiency anemia, unspecified: Secondary | ICD-10-CM | POA: Diagnosis not present

## 2019-12-27 DIAGNOSIS — Z79899 Other long term (current) drug therapy: Secondary | ICD-10-CM | POA: Diagnosis not present

## 2019-12-27 DIAGNOSIS — I10 Essential (primary) hypertension: Secondary | ICD-10-CM | POA: Diagnosis not present

## 2019-12-27 DIAGNOSIS — G4730 Sleep apnea, unspecified: Secondary | ICD-10-CM | POA: Diagnosis not present

## 2019-12-27 DIAGNOSIS — R002 Palpitations: Secondary | ICD-10-CM | POA: Insufficient documentation

## 2019-12-27 LAB — CBC
HCT: 42.9 % (ref 36.0–46.0)
Hemoglobin: 13 g/dL (ref 12.0–15.0)
MCH: 24.6 pg — ABNORMAL LOW (ref 26.0–34.0)
MCHC: 30.3 g/dL (ref 30.0–36.0)
MCV: 81.3 fL (ref 80.0–100.0)
Platelets: 252 10*3/uL (ref 150–400)
RBC: 5.28 MIL/uL — ABNORMAL HIGH (ref 3.87–5.11)
RDW: 13.9 % (ref 11.5–15.5)
WBC: 5 10*3/uL (ref 4.0–10.5)
nRBC: 0 % (ref 0.0–0.2)

## 2019-12-27 LAB — BASIC METABOLIC PANEL
Anion gap: 9 (ref 5–15)
Anion gap: 10 (ref 5–15)
BUN: 11 mg/dL (ref 8–23)
BUN: <5 mg/dL — ABNORMAL LOW (ref 8–23)
CO2: 25 mmol/L (ref 22–32)
CO2: 24 mmol/L (ref 22–32)
Calcium: 8.8 mg/dL — ABNORMAL LOW (ref 8.9–10.3)
Calcium: 9.4 mg/dL (ref 8.9–10.3)
Chloride: 105 mmol/L (ref 98–111)
Chloride: 107 mmol/L (ref 98–111)
Creatinine, Ser: 0.69 mg/dL (ref 0.44–1.00)
Creatinine, Ser: 0.67 mg/dL (ref 0.44–1.00)
GFR calc Af Amer: >60 mL/min (ref >60)
GFR calc Af Amer: >60 mL/min (ref >60)
GFR calc non Af Amer: >60 mL/min (ref >60)
GFR calc non Af Amer: >60 mL/min (ref >60)
Glucose, Bld: 130 mg/dL — ABNORMAL HIGH (ref 70–99)
Glucose, Bld: 105 mg/dL — ABNORMAL HIGH (ref 70–99)
Potassium: 3.7 mmol/L (ref 3.5–5.1)
Potassium: 3.6 mmol/L (ref 3.5–5.1)
Sodium: 139 mmol/L (ref 135–145)
Sodium: 141 mmol/L (ref 135–145)

## 2019-12-27 LAB — CBC WITH DIFFERENTIAL/PLATELET
Abs Immature Granulocytes: 0.01 10*3/uL (ref 0.00–0.07)
Basophils Absolute: 0 10*3/uL (ref 0.0–0.1)
Basophils Relative: 1 %
Eosinophils Absolute: 0.2 10*3/uL (ref 0.0–0.5)
Eosinophils Relative: 3 %
HCT: 37.3 % (ref 36.0–46.0)
Hemoglobin: 12.2 g/dL (ref 12.0–15.0)
Immature Granulocytes: 0 %
Lymphocytes Relative: 50 %
Lymphs Abs: 3 10*3/uL (ref 0.7–4.0)
MCH: 25.7 pg — ABNORMAL LOW (ref 26.0–34.0)
MCHC: 32.7 g/dL (ref 30.0–36.0)
MCV: 78.7 fL — ABNORMAL LOW (ref 80.0–100.0)
Monocytes Absolute: 0.6 10*3/uL (ref 0.1–1.0)
Monocytes Relative: 10 %
Neutro Abs: 2.1 10*3/uL (ref 1.7–7.7)
Neutrophils Relative %: 36 %
Platelets: 215 10*3/uL (ref 150–400)
RBC: 4.74 MIL/uL (ref 3.87–5.11)
RDW: 14 % (ref 11.5–15.5)
WBC: 5.9 10*3/uL (ref 4.0–10.5)
nRBC: 0 % (ref 0.0–0.2)

## 2019-12-27 LAB — SARS CORONAVIRUS 2 (TAT 6-24 HRS): SARS Coronavirus 2: NEGATIVE

## 2019-12-27 LAB — TROPONIN I (HIGH SENSITIVITY): Troponin I (High Sensitivity): 5 ng/L (ref <18)

## 2019-12-27 NOTE — Telephone Encounter (Signed)
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  She is scheduled for EP study and ablation tomorrow morning with Dr. Ladona Ridgel.  Her son called to let us know that she developed an episode of incessant symptomatic tachycardia while at work this evening.  Her coworkers called EMS and he was told that she would be taken to the [facility] emergency department.  I let him know that we will evaluate [name] in the emergency department and will communicate with Dr. Ladona Ridgel regarding her procedure for tomorrow.  If it turns out that she does not go to the emergency department this evening, she or her son will call us back.  Cynda Acres, MD

## 2019-12-27 NOTE — Progress Notes (Signed)
Attempted to call patient regarding procedure instructions for tomorrow.  Left voicemail nothing to eat or drink after midnight, no meds morning of procedure, needs responsible adult to drive you home as well as stay overnight.

## 2019-12-27 NOTE — ED Notes (Signed)
Pt checked out AMA. 

## 2019-12-27 NOTE — ED Triage Notes (Signed)
Pt arrives to ED w/ c/o  w/ EMS w/ c/o rapid HR. Pt has hx of SVT and scheduled for ablation tomorrow morning. EMS EKG shows NSR. EMS, VSS.

## 2019-12-28 ENCOUNTER — Ambulatory Visit (HOSPITAL_COMMUNITY)
Admission: RE | Admit: 2019-12-28 | Discharge: 2019-12-28 | Disposition: A | Discharge status: Home / Self Care | Payer: PRIVATE HEALTH INSURANCE | Attending: Internal Medicine | Admitting: Internal Medicine

## 2019-12-28 ENCOUNTER — Other Ambulatory Visit: Payer: Self-pay

## 2019-12-28 ENCOUNTER — Encounter (HOSPITAL_COMMUNITY)
Admission: RE | Disposition: A | Discharge status: Home / Self Care | Payer: Self-pay | Source: Home / Self Care | Attending: Internal Medicine

## 2019-12-28 DIAGNOSIS — K219 Gastro-esophageal reflux disease without esophagitis: Secondary | ICD-10-CM | POA: Insufficient documentation

## 2019-12-28 DIAGNOSIS — I471 Supraventricular tachycardia: Secondary | ICD-10-CM | POA: Insufficient documentation

## 2019-12-28 DIAGNOSIS — Z7982 Long term (current) use of aspirin: Secondary | ICD-10-CM | POA: Insufficient documentation

## 2019-12-28 DIAGNOSIS — I10 Essential (primary) hypertension: Secondary | ICD-10-CM | POA: Insufficient documentation

## 2019-12-28 DIAGNOSIS — D509 Iron deficiency anemia, unspecified: Secondary | ICD-10-CM | POA: Insufficient documentation

## 2019-12-28 DIAGNOSIS — Z79899 Other long term (current) drug therapy: Secondary | ICD-10-CM | POA: Insufficient documentation

## 2019-12-28 DIAGNOSIS — G4730 Sleep apnea, unspecified: Secondary | ICD-10-CM | POA: Insufficient documentation

## 2019-12-28 DIAGNOSIS — Z791 Long term (current) use of non-steroidal anti-inflammatories (NSAID): Secondary | ICD-10-CM | POA: Insufficient documentation

## 2019-12-28 HISTORY — PX: SVT ABLATION: EP1225

## 2019-12-28 SURGERY — SVT ABLATION

## 2019-12-28 MED ORDER — FENTANYL CITRATE (PF) 100 MCG/2ML IJ SOLN
INTRAMUSCULAR | Status: DC | PRN
Start: 2019-12-28 — End: 2019-12-28
  Administered 2019-12-28 (×2): 25 ug via INTRAVENOUS
  Administered 2019-12-28: 12.5 ug via INTRAVENOUS
  Administered 2019-12-28: 25 ug via INTRAVENOUS
  Administered 2019-12-28 (×3): 12.5 ug via INTRAVENOUS

## 2019-12-28 MED ORDER — MIDAZOLAM HCL 5 MG/5ML IJ SOLN
INTRAMUSCULAR | Status: AC
  Filled 2019-12-28: qty 5

## 2019-12-28 MED ORDER — MIDAZOLAM HCL 5 MG/5ML IJ SOLN
INTRAMUSCULAR | Status: DC | PRN
  Administered 2019-12-28 (×3): 2 mg via INTRAVENOUS
  Administered 2019-12-28 (×4): 1 mg via INTRAVENOUS

## 2019-12-28 MED ORDER — BUPIVACAINE HCL (PF) 0.25 % IJ SOLN
INTRAMUSCULAR | Status: DC | PRN
  Administered 2019-12-28: 60 mL
  Administered 2019-12-28: 50 mL

## 2019-12-28 MED ORDER — HEPARIN (PORCINE) IN NACL 1000-0.9 UT/500ML-% IV SOLN
INTRAVENOUS | Status: AC
  Filled 2019-12-28: qty 1000

## 2019-12-28 MED ORDER — FENTANYL CITRATE (PF) 100 MCG/2ML IJ SOLN
INTRAMUSCULAR | Status: AC
  Filled 2019-12-28: qty 2

## 2019-12-28 MED ORDER — HEPARIN (PORCINE) IN NACL 1000-0.9 UT/500ML-% IV SOLN
INTRAVENOUS | Status: AC
  Filled 2019-12-28: qty 500

## 2019-12-28 MED ORDER — ISOPROTERENOL HCL 0.2 MG/ML IJ SOLN
INTRAMUSCULAR | Status: AC
  Filled 2019-12-28: qty 5

## 2019-12-28 MED ORDER — BUPIVACAINE HCL (PF) 0.25 % IJ SOLN
INTRAMUSCULAR | Status: AC
  Filled 2019-12-28: qty 60

## 2019-12-28 MED ORDER — HEPARIN (PORCINE) IN NACL 1000-0.9 UT/500ML-% IV SOLN
INTRAVENOUS | Status: DC | PRN
  Administered 2019-12-28 (×2): 500 mL

## 2019-12-28 MED ORDER — ISOPROTERENOL HCL 0.2 MG/ML IJ SOLN
INTRAVENOUS | Status: DC | PRN
  Administered 2019-12-28: 4 ug/min via INTRAVENOUS

## 2019-12-28 MED ORDER — SODIUM CHLORIDE 0.9 % IV SOLN: INTRAVENOUS | Status: DC

## 2019-12-28 SURGICAL SUPPLY — 11 items
BAG SNAP BAND KOVER 36X36 (MISCELLANEOUS) ×2 IMPLANT
CATH CELSIUS THERM D CV 7F (ABLATOR) ×2 IMPLANT
CATH HEX JOSEPH 2-5-2 65CM 6F (CATHETERS) ×2 IMPLANT
CATH JOSEPH QUAD ALLRED 6F REP (CATHETERS) ×4 IMPLANT
PACK EP LATEX FREE (CUSTOM PROCEDURE TRAY) ×2
PACK EP LF (CUSTOM PROCEDURE TRAY) ×1 IMPLANT
PAD PRO RADIOLUCENT 2001M-C (PAD) ×2 IMPLANT
SHEATH PINNACLE 6F 10CM (SHEATH) ×4 IMPLANT
SHEATH PINNACLE 7F 10CM (SHEATH) ×2 IMPLANT
SHEATH PINNACLE 8F 10CM (SHEATH) ×2 IMPLANT
SHIELD RADPAD SCOOP 12X17 (MISCELLANEOUS) ×2 IMPLANT

## 2019-12-28 NOTE — H&P (Signed)
ELECTROPHYSIOLOGY CONSULT NOTE  Patient ID: Annette Romero, MRN: [id number], DOB/AGE: March 30, 1961 59 y.o. Admit date: (Not on file) Date of Consult: 12/06/2019  Primary Physician: Hannah Beat, MD Primary Cardiologist: [facility]     Annette Romero is a 59 y.o. female who is being seen today for the evaluation of SVT at the request of [facility]AE.    HPI Annette Romero is a 59 y.o. female has a 30-40-year history of recurrent abrupt onset offset tachypalpitations without specific triggers.  Associated with lightheadedness and shortness of breath.  They are frog negative and diuretic negative.  Increasingly frequent of late.  No occurring about once a month.  Lasting up to 30-40 minutes.  Previously but no longer easily terminated with Valsalva.  Has been treated with increasing doses of beta-blocker and now taking 100 mg of metoprolol twice daily with continued episodes  Problems recently with poorly controlled hypertension.  Has sleep disordered breathing  DATE TEST EF   5/21 Echo   60-65 %         Date Cr K Hgb  5/21 0.73 3.5 12.5(12/20) next    History of previously treated hypothyroidism      Past Medical History:  Diagnosis Date   GERD (gastroesophageal reflux disease)    Heart murmur    Hypertension    Hyperthyroidism    Iron deficiency anemia    Tachycardia    with increased thyroid level   Vitamin D deficiency       Surgical History:       Past Surgical History:  Procedure Laterality Date   ABDOMINAL HYSTERECTOMY     BREAST BIOPSY  1974   breast cyst   BREAST CYST ASPIRATION     BREAST EXCISIONAL BIOPSY Left    at age of 66   CESAREAN SECTION  1991   HARDWARE REMOVAL Right 04/28/2017   Procedure: HARDWARE REMOVAL-RIGHT ANKLE;  Surgeon: Kennedy Bucker, MD;  Location: [facility] ORS;  Service: Orthopedics;  Laterality: Right;   ORIF ANKLE FRACTURE Right 04/30/2016   Procedure: OPEN REDUCTION INTERNAL FIXATION (ORIF) ANKLE  FRACTURE;  Surgeon: Kennedy Bucker, MD;  Location: [facility] ORS;  Service: Orthopedics;  Laterality: Right;   VAGINAL HYSTERECTOMY  2001   heavy bleeding due to fibroids, not cancer     Home Meds: Active Medications      Current Meds  Medication Sig   aspirin EC 81 MG tablet Take 81 mg by mouth daily.   Calcium Carb-Cholecalciferol (CALCIUM-VITAMIN D3) 600-400 MG-UNIT CAPS Take 1 capsule by mouth daily.   ferrous sulfate 325 (65 FE) MG tablet Take 325 mg by mouth 2 (two) times daily with a meal.   Ginkgo Biloba 40 MG TABS Take 2 tablets by mouth daily.   glucosamine-chondroitin 500-400 MG tablet Take 1 tablet by mouth 3 (three) times daily.   losartan (COZAAR) 50 MG tablet Take 1 tablet (50 mg total) by mouth in the morning and at bedtime. (Patient taking differently: Take 50 mg by mouth in the morning and at bedtime. 1 tab in the am, 1 tab in the pm (100mg  total))   meloxicam (MOBIC) 15 MG tablet Take 1 tablet (15 mg total) by mouth daily. (Patient taking differently: Take 15 mg by mouth as needed. )   metoprolol tartrate (LOPRESSOR) 100 MG tablet Take 1 tablet (100 mg total) by mouth 2 (two) times daily.   Misc Natural Products (HIMALAYAN GOJI PO) Take by mouth daily.   OVER THE COUNTER MEDICATION Goli Apple Cider Gummy-2 gummy daily  rosuvastatin (CRESTOR) 20 MG tablet TAKE ONE TABLET ONCE DAILY BY MOUTH   NOTE DOSE INCREASE   SUMAtriptan (IMITREX) 50 MG tablet Take 1 tablet (50 mg total) by mouth every 2 (two) hours as needed for migraine or headache.   traZODone (DESYREL) 150 MG tablet TAKE 1 TABLET (150 MG TOTAL) BY MOUTH AT BEDTIME AS NEEDED FOR SLEEP.   TURMERIC PO Take 2 tablets by mouth daily.   vitamin E 1000 UNIT capsule Take 2,000 Units by mouth daily.      Allergies:      Allergies  Allergen Reactions   Zantac [Ranitidine Hcl] Rash    Social History        Socioeconomic History   Marital status: Married    Spouse name: Not on file    Number of children: 2   Years of education: Not on file   Highest education level: Not on file  Occupational History   Occupation: nurse    Comment: LPN at [employer]  Tobacco Use   Smoking status: Never Smoker   Smokeless tobacco: Never Used  Building services engineer Use: Never used  Substance and Sexual Activity   Alcohol use: No   Drug use: No   Sexual activity: Not on file  Other Topics Concern   Not on file  Social History Narrative   Nurse at [employer]   2 children   Son, 33, lives in [place], [address], [place]. [place]   Social Determinants of Health      Financial Resource Strain:    Difficulty of Paying Living Expenses: Not on file  Food Insecurity:    Worried About Programme researcher, broadcasting/film/video in the Last Year: Not on file   [employer] of Food in the Last Year: Not on file  Transportation Needs:    Lack of Transportation (Medical): Not on file   Lack of Transportation (Non-Medical): Not on file  Physical Activity:    Days of Exercise per Week: Not on file   Minutes of Exercise per Session: Not on file  Stress:    Feeling of Stress : Not on file  Social Connections:    Frequency of Communication with Friends and Family: Not on file   Frequency of Social Gatherings with Friends and Family: Not on file   Attends Religious Services: Not on file   Active Member of Clubs or Organizations: Not on file   Attends Banker Meetings: Not on file   Marital Status: Not on file  Intimate Partner Violence:    Fear of Current or Ex-Partner: Not on file   Emotionally Abused: Not on file   Physically Abused: Not on file   Sexually Abused: Not on file          Family History  Problem Relation Age of Onset   CVA Father    Heart attack Father    Heart failure Father    Seizures Mother    Drug abuse Brother        d/c from drugs   Breast cancer Neg Hx      ROS:  Please see the history of present  illness.     All other systems reviewed and negative.    Physical Exam:  Blood pressure 130/78, pulse (!) 59, height 5\' 8"  (1.727 m), weight 247 lb (112 kg), SpO2 100 %. General: Well developed, well nourished female in no acute distress. Head: Normocephalic, atraumatic, sclera non-icteric, no xanthomas,  nares are without discharge. EENT: normal  Lymph Nodes:  none Neck: Negative for carotid bruits. JVD not elevated. Back:without scoliosis kyphosis Lungs: Clear bilaterally to auscultation without wheezes, rales, or rhonchi. Breathing is unlabored. Heart: RRR with S1 S2. No murmur . No rubs, or gallops appreciated. Abdomen: Soft, non-tender, non-distended with normoactive bowel sounds. No hepatomegaly. No rebound/guarding. No obvious abdominal masses. Msk:  Strength and tone appear normal for age. Extremities: No clubbing or cyanosis. No edema.  Distal pedal pulses are 2+ and equal bilaterally. Skin: Warm and Dry Neuro: Alert and oriented X 3. CN III-XII intact Grossly normal sensory and motor function . Psych:  Responds to questions appropriately with a normal affect.                 Labs: Cardiac Enzymes Recent Labs (last 2 labs)   No results for input(s): CKTOTAL, CKMB, TROPONINI in the last 72 hours.   CBC Recent Labs       Lab Results  Component Value Date   WBC 6.5 03/30/2019   HGB 12.5 03/30/2019   HCT 38.0 03/30/2019   MCV 80.4 03/30/2019   PLT 230.0 03/30/2019     PROTIME: Recent Labs (last 2 labs)   No results for input(s): LABPROT, INR in the last 72 hours.   Chemistry  Last Labs   No results for input(s): NA, K, CL, CO2, BUN, CREATININE, CALCIUM, PROT, BILITOT, ALKPHOS, ALT, AST, GLUCOSE in the last 168 hours.  Invalid input(s): LABALBU   Lipids Recent Labs       Lab Results  Component Value Date   CHOL 209 (H) 03/30/2019   HDL 40.80 03/30/2019   LDLCALC 129 (H) 03/30/2019   TRIG 195.0 (H) 03/30/2019     BNP Last Labs  No  results found for: PROBNP   Thyroid Function Tests:  Recent Labs (last 2 labs)   No results for input(s): TSH, T4TOTAL, T3FREE, THYROIDAB in the last 72 hours.  Invalid input(s): FREET3   Miscellaneous Recent Labs  No results found for: DDIMER    Radiology/Studies:  Imaging Results  No results found.    EKG: Sinus rhythm at 59 Intervals 18/09/46 No evidence of ventricular preexcitation   Assessment and Plan:  SVT probably AV reentry  Hypertension  Sleep disordered breathing     The patient has recurrent and life disrupting SVT.  Suspect AV reentry.  Have discussed treatment options including ongoing therapy with beta-blockers alternative treatment with calcium blockers and/or catheter ablation.  We have discussed risks and benefits including but not limited to death, perforation and heart block requiring pacing.  She is eager to pursue catheter ablation. Discussed that it could potentially be left-sided; access would inform how much time off from work she would need.  If it were left-sided she might need as many as 5 or 6 days   We will have Dr. Lubertha Basque office contact her and come up with a plan.  In addition, have recommended that she undergo a sleep study.  This may also help blood pressure control.      Sherryl Manges   EP Attending  Discussed with Dr. Crissie Sickles. 59 yo woman with a longstanding h/o SVT presents for EP study and catheter ablation. I have reviewed the indictions/risks/benefits/goals/expectations of catheter ablation and she wishes to proceed.  Sharlot Gowda Ranen Doolin,MD

## 2019-12-28 NOTE — Progress Notes (Signed)
Site area: rt groin 3 fv sheaths Site Prior to Removal:  Level 0 Pressure Applied For: 10 minutes Manual:   yes Patient Status During Pull:  stable Post Pull Site:  Level 0 Post Pull Instructions Given:  yes Post Pull Pulses Present: rt dp palpable Dressing Applied:  Gauze and tegaderm Bedrest begins @ 1020 Comments: IV saline locked

## 2019-12-28 NOTE — Progress Notes (Signed)
Site area: rt ij venous sheath Site Prior to Removal:  Level 0 Pressure Applied For: 10 minutes Manual:   yes Patient Status During Pull:  stable Post Pull Site:  Level 0 Post Pull Instructions Given:  yes Post Pull Pulses Present: NA Dressing Applied:  Petroleum gauze, gauze and tegaderm Bedrest begins @  Comments:   

## 2019-12-28 NOTE — Progress Notes (Addendum)
Up and walked and tolerated well; right groin site and right neck site unremarkable, no bleeding or hematoma; Dr Ladona Ridgel in earlier and per Dr Ladona Ridgel, ok to d/c home at 1700

## 2019-12-28 NOTE — Discharge Instructions (Signed)
Cardiac Ablation, Care After This sheet gives you information about how to care for yourself after your procedure. Your health care provider may also give you more specific instructions. If you have problems or questions, contact your health care provider. What can I expect after the procedure? After the procedure, it is common to have:  Bruising around your puncture site.  Tenderness around your puncture site.  Skipped heartbeats.  Tiredness (fatigue). Follow these instructions at home: Puncture site care   Follow instructions from your health care provider about how to take care of your puncture site. Make sure you: ? Wash your hands with soap and water before you change your bandage (dressing). If soap and water are not available, use hand sanitizer. ? Change your dressing as told by your health care provider. ? Leave stitches (sutures), skin glue, or adhesive strips in place. These skin closures may need to stay in place for up to 2 weeks. If adhesive strip edges start to loosen and curl up, you may trim the loose edges. Do not remove adhesive strips completely unless your health care provider tells you to do that.  Check your puncture site every day for signs of infection. Check for: ? Redness, swelling, or pain. ? Fluid or blood. If your puncture site starts to bleed, lie down on your back, apply firm pressure to the area, and contact your health care provider. ? Warmth. ? Pus or a bad smell. Driving  Ask your health care provider when it is safe for you to drive again after the procedure.  Do not drive or use heavy machinery while taking prescription pain medicine.  Do not drive for 24 hours if you were given a medicine to help you relax (sedative) during your procedure. Activity  Avoid activities that take a lot of effort for at least 3 days after your procedure.  Do not lift anything that is heavier than 10 lb (4.5 kg), or the limit that you are told, until your health  care provider says that it is safe.  Return to your normal activities as told by your health care provider. Ask your health care provider what activities are safe for you. General instructions  Take over-the-counter and prescription medicines only as told by your health care provider.  Do not use any products that contain nicotine or tobacco, such as cigarettes and e-cigarettes. If you need help quitting, ask your health care provider.  Do not take baths, swim, or use a hot tub until your health care provider approves.  Do not drink alcohol for 24 hours after your procedure.  Keep all follow-up visits as told by your health care provider. This is important. Contact a health care provider if:  You have redness, mild swelling, or pain around your puncture site.  You have fluid or blood coming from your puncture site that stops after applying firm pressure to the area.  Your puncture site feels warm to the touch.  You have pus or a bad smell coming from your puncture site.  You have a fever.  You have chest pain or discomfort that spreads to your neck, jaw, or arm.  You are sweating a lot.  You feel nauseous.  You have a fast or irregular heartbeat.  You have shortness of breath.  You are dizzy or light-headed and feel the need to lie down.  You have pain or numbness in the arm or leg closest to your puncture site. Get help right away if:  Your puncture  site suddenly swells.  Your puncture site is bleeding and the bleeding does not stop after applying firm pressure to the area. These symptoms may represent a serious problem that is an emergency. Do not wait to see if the symptoms will go away. Get medical help right away. Call your local emergency services (911 in the [place] Do not drive yourself to the hospital. Summary  After the procedure, it is normal to have bruising and tenderness at the puncture site in your groin, neck, or forearm.  Check your puncture site every  day for signs of infection.  Get help right away if your puncture site is bleeding and the bleeding does not stop after applying firm pressure to the area. This is a medical emergency. This information is not intended to replace advice given to you by your health care provider. Make sure you discuss any questions you have with your health care provider. Document Revised: 03/11/2017 Document Reviewed: 07/08/2016 Elsevier Patient Education  2020 [employer].    Post procedure care instructions No driving for 4 days. No lifting over 5 lbs for 1 week. No vigorous or sexual activity for 1 week. You may return to work/your usual activities on 01/04/20. Keep procedure site clean & dry. If you notice increased pain, swelling, bleeding or pus, call/return!  You may shower, but no soaking baths/hot tubs/pools for 1 week.

## 2019-12-31 ENCOUNTER — Encounter (HOSPITAL_COMMUNITY): Payer: Self-pay | Admitting: Internal Medicine

## 2019-12-31 ENCOUNTER — Telehealth: Payer: Self-pay | Admitting: Internal Medicine

## 2019-12-31 ENCOUNTER — Encounter: Payer: Self-pay | Admitting: Internal Medicine

## 2019-12-31 MED ORDER — METOPROLOL TARTRATE 50 MG PO TABS: 50.0000 mg | ORAL_TABLET | Freq: Two times a day (BID) | ORAL | 3 refills | Status: DC

## 2019-12-31 NOTE — Telephone Encounter (Signed)
error 

## 2019-12-31 NOTE — Telephone Encounter (Signed)
Returned call to Pt.    Advised per Dr. Ladona Ridgel- decrease metoprolol tartrate to 50 mg PO BID until f/u.  Will decide at f/u if  Pt should continue metoprolol.  Pt indicates understanding.

## 2019-12-31 NOTE — Telephone Encounter (Signed)
Pt c/o medication issue:  1. Name of Medication:   metoprolol tartrate (LOPRESSOR) 100 MG tablet   2. How are you currently taking this medication (dosage and times per day)? As directed  3. Are you having a reaction (difficulty breathing--STAT)? no  4. What is your medication issue? Patient wants to know if she should continue this medication or if she should have immediately stopped it after the ablation.

## 2020-01-02 NOTE — Telephone Encounter (Signed)
Spoke with pt she stated she had heart surgery 9/17  She will call back to schedule

## 2020-01-10 ENCOUNTER — Ambulatory Visit: Payer: No Typology Code available for payment source | Admitting: Cardiology

## 2020-01-11 ENCOUNTER — Encounter: Payer: Self-pay | Admitting: Cardiology

## 2020-01-14 ENCOUNTER — Telehealth: Payer: Self-pay | Admitting: Cardiology

## 2020-01-14 MED ORDER — AMLODIPINE BESYLATE 5 MG PO TABS: 5.0000 mg | ORAL_TABLET | Freq: Every day | ORAL | 3 refills | Status: DC

## 2020-01-14 NOTE — Telephone Encounter (Signed)
Pt c/o BP issue: STAT if pt c/o blurred vision, one-sided weakness or slurred speech  1. What are your last 5 BP readings?   154/94 149/94 153/84 154/92   2. Are you having any other symptoms (ex. Dizziness, headache, blurred vision, passed out)? Headache dizziness   3. What is your BP issue? Elevated

## 2020-01-14 NOTE — Telephone Encounter (Signed)
Spoke with Dr. Azucena Cecil and he recommends that the patient start Amlodipine 5mg  QD. I sent prescription to [facility] [place].  He also wants patient to schedule a 4-6 week follow up with him.  Relayed all recommendations to patient, and she will call back to schedule the follow up. Patient was appreciative for the call back.

## 2020-01-14 NOTE — Telephone Encounter (Signed)
Patient returning call about Elevated BP

## 2020-01-29 ENCOUNTER — Encounter: Payer: Self-pay | Admitting: Internal Medicine

## 2020-01-29 ENCOUNTER — Other Ambulatory Visit: Payer: Self-pay

## 2020-01-29 ENCOUNTER — Ambulatory Visit: Payer: No Typology Code available for payment source | Admitting: Internal Medicine

## 2020-01-29 VITALS — BP 122/78 | HR 66 | Ht 68.0 in | Wt 255.2 lb

## 2020-01-29 DIAGNOSIS — I471 Supraventricular tachycardia: Secondary | ICD-10-CM | POA: Diagnosis not present

## 2020-01-29 NOTE — Patient Instructions (Addendum)
Medication Instructions:  Your physician recommends that you continue on your current medications as directed. Please refer to the Current Medication list given to you today.  Labwork: None ordered.  Testing/Procedures: None ordered.  Follow-Up: Your physician wants you to follow-up in: as needed with Dr. Taylor.      Any Other Special Instructions Will Be Listed Below (If Applicable).  If you need a refill on your cardiac medications before your next appointment, please call your pharmacy.   

## 2020-01-29 NOTE — Progress Notes (Signed)
HPI Annette Romero returns today for followup of her EPS/RFA of ANVRT. She has done well in the interim. No palpitations and minimal chest pain or sob. She has not done much in the way of exercise or weight loss. She denies syncope. No recurrent SVT Allergies  Allergen Reactions  . Zantac [Ranitidine Hcl] Rash     Current Outpatient Medications  Medication Sig Dispense Refill  . amLODipine (NORVASC) 5 MG tablet Take 1 tablet (5 mg total) by mouth daily. 30 tablet 3  . aspirin EC 81 MG tablet Take 81 mg by mouth daily.    . Calcium Carb-Cholecalciferol (CALCIUM-VITAMIN D3) 600-400 MG-UNIT CAPS Take 1 capsule by mouth daily.     . ferrous sulfate 325 (65 FE) MG tablet Take 325 mg by mouth 2 (two) times daily with a meal.     . GINKGO BILOBA PO Take 1 tablet by mouth daily.     Marland Kitchen glucosamine-chondroitin 500-400 MG tablet Take 1 tablet by mouth 3 (three) times daily.     . meloxicam (MOBIC) 15 MG tablet Take 1 tablet (15 mg total) by mouth daily. (Patient taking differently: Take 15 mg by mouth at bedtime as needed for pain. ) 30 tablet 5  . metoprolol tartrate (LOPRESSOR) 50 MG tablet Take 1 tablet (50 mg total) by mouth 2 (two) times daily. 180 tablet 3  . Misc Natural Products (HIMALAYAN GOJI PO) Take by mouth daily.     . Multiple Vitamin (MULTIVITAMIN WITH MINERALS) TABS tablet Take 1 tablet by mouth daily.    Marland Kitchen OVER THE COUNTER MEDICATION Goli Apple Cider Gummy-2 gummy daily     . rosuvastatin (CRESTOR) 20 MG tablet Take 20 mg by mouth at bedtime.     . SUMAtriptan (IMITREX) 50 MG tablet Take 1 tablet (50 mg total) by mouth every 2 (two) hours as needed for migraine or headache. 10 tablet 5  . traZODone (DESYREL) 150 MG tablet TAKE 1 TABLET (150 MG TOTAL) BY MOUTH AT BEDTIME AS NEEDED FOR SLEEP. 90 tablet 0  . TURMERIC PO Take 2 tablets by mouth daily.     . vitamin E 1000 UNIT capsule Take 2,000 Units by mouth daily.      No current facility-administered medications for this  visit.     Past Medical History:  Diagnosis Date  . GERD (gastroesophageal reflux disease)   . Heart murmur   . Hypertension   . Hyperthyroidism   . Iron deficiency anemia   . Tachycardia    with increased thyroid level  . Vitamin D deficiency     ROS:   All systems reviewed and negative except as noted in the HPI.   Past Surgical History:  Procedure Laterality Date  . ABDOMINAL HYSTERECTOMY    . BREAST BIOPSY  1974   breast cyst  . BREAST CYST ASPIRATION    . BREAST EXCISIONAL BIOPSY Left    at age of 29  . CESAREAN SECTION  1991  . HARDWARE REMOVAL Right 04/28/2017   Procedure: HARDWARE REMOVAL-RIGHT ANKLE;  Surgeon: Kennedy Bucker, MD;  Location: [facility] ORS;  Service: Orthopedics;  Laterality: Right;  . ORIF ANKLE FRACTURE Right 04/30/2016   Procedure: OPEN REDUCTION INTERNAL FIXATION (ORIF) ANKLE FRACTURE;  Surgeon: Kennedy Bucker, MD;  Location: [facility] ORS;  Service: Orthopedics;  Laterality: Right;  . SVT ABLATION N/A 12/28/2019   Procedure: SVT ABLATION;  Surgeon: Marinus Maw, MD;  Location: [facility] INVASIVE CV LAB;  Service: Cardiovascular;  Laterality: N/A;  .  VAGINAL HYSTERECTOMY  2001   heavy bleeding due to fibroids, not cancer     Family History  Problem Relation Age of Onset  . CVA Father   . Heart attack Father   . Heart failure Father   . Seizures Mother   . Drug abuse Brother        d/c from drugs  . Breast cancer Neg Hx      Social History   Socioeconomic History  . Marital status: Married    Spouse name: Not on file  . Number of children: 2  . Years of education: Not on file  . Highest education level: Not on file  Occupational History  . 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 Smoking status: Never Smoker  . Smokeless tobacco: Never Used  Vaping Use  . Vaping Use: Never used  Substance and Sexual Activity  . Alcohol use: No  . Drug use: No  . Sexual activity: Not on file  Other Topics Concern  . Not on file    Social History Narrative   Nurse at [employer]   Separated   2 children   Son, 103, lives in [place], [address], [place]. [place]   Social Determinants of Health   Financial Resource Strain:   . Difficulty of Paying Living Expenses: Not on file  Food Insecurity:   . Worried About Programme researcher, broadcasting/film/video in the Last Year: Not on file  . Ran Out of Food in the Last Year: Not on file  Transportation Needs:   . Lack of Transportation (Medical): Not on file  . Lack of Transportation (Non-Medical): Not on file  Physical Activity:   . Days of Exercise per Week: Not on file  . Minutes of Exercise per Session: Not on file  Stress:   . Feeling of Stress : Not on file  Social Connections:   . Frequency of Communication with Friends and Family: Not on file  . Frequency of Social Gatherings with Friends and Family: Not on file  . Attends Religious Services: Not on file  . Active Member of Clubs or Organizations: Not on file  . Attends Banker Meetings: Not on file  . Marital Status: Not on file  Intimate Partner Violence:   . Fear of Current or Ex-Partner: Not on file  . Emotionally Abused: Not on file  . Physically Abused: Not on file  . Sexually Abused: Not on file     BP 122/78   Pulse 66   Ht 5\' 8"  (1.727 m)   Wt 255 lb 3.2 oz (115.8 kg)   SpO2 96%   BMI 38.80 kg/m   Physical Exam:  Well appearing NAD HEENT: Unremarkable Neck:  No JVD, no thyromegally Lymphatics:  No adenopathy Back:  No CVA tenderness Lungs:  Clear with no wheezes HEART:  Regular rate rhythm, no murmurs, no rubs, no clicks Abd:  soft, positive bowel sounds, no organomegally, no rebound, no guarding Ext:  2 plus pulses, no edema, no cyanosis, no clubbing Skin:  No rashes no nodules Neuro:  CN II through XII intact, motor grossly intact  EKG - nsr  Assess/Plan: 1, /SVT - she is s/p EpS/RFA and doing well.  2. HTN - she would like to reduce her meds. However, her bp tends to be  elevated and I am concerned that if we stop the metoprolol she will have more HTN. 3. Obesity - I strongly  encouraged the patient to lose weight.  4. Dyslipidemia - she will continue her statin therapy and increase her activity.  Lewayne Bunting, MD

## 2020-02-07 ENCOUNTER — Telehealth: Payer: Self-pay | Admitting: Cardiology

## 2020-02-07 NOTE — Telephone Encounter (Signed)
Patient c/o Palpitations:  High priority if patient c/o lightheadedness, shortness of breath, or chest pain  1) How long have you had palpitations/irregular HR/ Afib? Are you having the symptoms now? Irregular HR and fluttering  2) Are you currently experiencing lightheadedness, SOB or CP? no  3) Do you have a history of afib (atrial fibrillation) or irregular heart rhythm? no  4) Have you checked your BP or HR? (document readings if available): 154/89 78  5) Are you experiencing any other symptoms? In the evenings mainly, stops her in her tracks   Patient has recent ablation 9/25

## 2020-02-18 ENCOUNTER — Other Ambulatory Visit: Payer: Self-pay | Admitting: Family Medicine

## 2020-02-18 ENCOUNTER — Ambulatory Visit: Payer: No Typology Code available for payment source | Admitting: Cardiology

## 2020-02-18 NOTE — Telephone Encounter (Signed)
Last office visit 07/19/2019 for fall/syncope.  Last CPE 01/28/2016.  Last refilled Meloxicam 07/19/2019 for #30 with 5 refills. Trazodone 12/10/2019 for #90 with no refills.  No future appointments.

## 2020-03-03 NOTE — Telephone Encounter (Signed)
Called patient to see if she would like to reschedule her follow up as she cancelled appointment on 02/18/20. LMOM for her to call back.

## 2020-04-16 ENCOUNTER — Telehealth: Payer: Self-pay | Admitting: *Deleted

## 2020-04-16 NOTE — Telephone Encounter (Signed)
Patient is overdue for follow up appointment. Last 2 appointments were cancelled and a no show, Colleen left her a message in November to schedule and heard nothing. Please reach out to patient and try to get her scheduled so we may refill her medication. Thank you.

## 2020-05-15 ENCOUNTER — Other Ambulatory Visit: Payer: Self-pay

## 2020-05-15 ENCOUNTER — Ambulatory Visit (INDEPENDENT_AMBULATORY_CARE_PROVIDER_SITE_OTHER): Payer: BC Managed Care – PPO | Admitting: Cardiology

## 2020-05-15 ENCOUNTER — Encounter: Payer: Self-pay | Admitting: Cardiology

## 2020-05-15 VITALS — BP 120/80 | HR 79 | Ht 68.0 in | Wt 262.5 lb

## 2020-05-15 DIAGNOSIS — R600 Localized edema: Secondary | ICD-10-CM | POA: Diagnosis not present

## 2020-05-15 DIAGNOSIS — I10 Essential (primary) hypertension: Secondary | ICD-10-CM | POA: Diagnosis not present

## 2020-05-15 DIAGNOSIS — I471 Supraventricular tachycardia: Secondary | ICD-10-CM | POA: Diagnosis not present

## 2020-05-15 MED ORDER — IRBESARTAN 150 MG PO TABS: 150.0000 mg | ORAL_TABLET | Freq: Every day | ORAL | 5 refills | Status: DC

## 2020-05-15 NOTE — Progress Notes (Signed)
Cardiology Office Note:    Date:  05/15/2020   ID:  Arline Ketter, DOB 1960/11/26, MRN [id number]  PCP:  Hannah Beat, MD  Cardiologist:  Debbe Odea, MD  Electrophysiologist:  None   Referring MD: Hannah Beat, MD   Chief Complaint  Patient presents with  . 6 month follow up     Patient c/o elevated blood pressure, rapid heart rates at times, left leg numbness and swelling, right arm pain/numbness and chest discomfort. Medications reviewed by the patient verbally.     History of Present Illness:    Annette Romero is a 60 y.o. female with a hx of hypertension, hyperlipidemia, SVT s/p RFA 12/2019, who presents for follow-up.    Previously seen for palpitations, diagnosed with SVT, underwent radiofrequency ablation.  She felt well after her ablation, over the past several weeks, she has noticed lower extremity edema.  Her blood pressure medications were switched from HCTZ due to leg cramping to losartan.  Took losartan for some time but started having mental fogginess.  Losartan was then switched to Norvasc, blood pressures were running high, Norvasc increased to 10 mg daily.   Priornotes SVT/AVNRT noted on cardiac monitor Echocardiogram 08/2019 showed normal systolic and diastolic function, EF 60 to 65%, trivial MR SVT ablation 12/28/2019  Past Medical History:  Diagnosis Date  . GERD (gastroesophageal reflux disease)   . Heart murmur   . Hypertension   . Hyperthyroidism   . Iron deficiency anemia   . Tachycardia    with increased thyroid level  . Vitamin D deficiency     Past Surgical History:  Procedure Laterality Date  . ABDOMINAL HYSTERECTOMY    . BREAST BIOPSY  1974   breast cyst  . BREAST CYST ASPIRATION    . BREAST EXCISIONAL BIOPSY Left    at age of 21  . CESAREAN SECTION  1991  . HARDWARE REMOVAL Right 04/28/2017   Procedure: HARDWARE REMOVAL-RIGHT ANKLE;  Surgeon: Kennedy Bucker, MD;  Location: [facility] ORS;  Service: Orthopedics;  Laterality: Right;  .  ORIF ANKLE FRACTURE Right 04/30/2016   Procedure: OPEN REDUCTION INTERNAL FIXATION (ORIF) ANKLE FRACTURE;  Surgeon: Kennedy Bucker, MD;  Location: [facility] ORS;  Service: Orthopedics;  Laterality: Right;  . SVT ABLATION N/A 12/28/2019   Procedure: SVT ABLATION;  Surgeon: Marinus Maw, MD;  Location: [facility] INVASIVE CV LAB;  Service: Cardiovascular;  Laterality: N/A;  . VAGINAL HYSTERECTOMY  2001   heavy bleeding due to fibroids, not cancer    Current Medications: Current Meds  Medication Sig  . aspirin EC 81 MG tablet Take 81 mg by mouth daily.  . irbesartan (AVAPRO) 150 MG tablet Take 1 tablet (150 mg total) by mouth daily.  . meloxicam (MOBIC) 15 MG tablet Take 1 tablet (15 mg total) by mouth at bedtime as needed for pain.  . metoprolol tartrate (LOPRESSOR) 50 MG tablet Take 1 tablet (50 mg total) by mouth 2 (two) times daily.  . Misc Natural Products (HIMALAYAN GOJI PO) Take by mouth daily.   . Multiple Vitamin (MULTIVITAMIN WITH MINERALS) TABS tablet Take 1 tablet by mouth daily.  Marland Kitchen OVER THE COUNTER MEDICATION [place] Apple Cider Gummy-2 gummy daily   . rosuvastatin (CRESTOR) 20 MG tablet Take 20 mg by mouth at bedtime.   . SUMAtriptan (IMITREX) 50 MG tablet Take 1 tablet (50 mg total) by mouth every 2 (two) hours as needed for migraine or headache.  . traZODone (DESYREL) 150 MG tablet TAKE 1 TABLET (150 MG TOTAL) BY MOUTH  AT BEDTIME AS NEEDED FOR SLEEP.  Marland Kitchen TURMERIC PO Take 2 tablets by mouth daily.   . vitamin E 1000 UNIT capsule Take 2,000 Units by mouth daily.   . [DISCONTINUED] amLODipine (NORVASC) 10 MG tablet Take 10 mg by mouth daily.     Allergies:   Zantac [ranitidine hcl]   Social History   Socioeconomic History  . Marital status: Married    Spouse name: Not on file  . Number of children: 2  . Years of education: Not on file  . Highest education level: Not on file  Occupational History  . 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 Smoking status: Never  Smoker  . Smokeless tobacco: Never Used  Vaping Use  . Vaping Use: Never used  Substance and Sexual Activity  . Alcohol use: No  . Drug use: No  . Sexual activity: Not on file  Other Topics Concern  . Not on file  Social History Narrative   Nurse at [employer]   Separated   2 children   Son, 70, lives in [place], [address], [place]. [place]   Social Determinants of Health   Financial Resource Strain: Not on file  Food Insecurity: Not on file  Transportation Needs: Not on file  Physical Activity: Not on file  Stress: Not on file  Social Connections: Not on file     Family History: The patient's family history includes CVA in her father; Drug abuse in her brother; Heart attack in her father; Heart failure in her father; Seizures in her mother. There is no history of Breast cancer.  ROS:   Please see the history of present illness.     All other systems reviewed and are negative.  EKGs/Labs/Other Studies Reviewed:    The following studies were reviewed today:   EKG:  EKG is  ordered today.  The ekg ordered today demonstrates normal sinus rhythm, normal ECG.   Recent Labs: 12/27/2019: BUN <5; Creatinine, Ser 0.67; Hemoglobin 13.0; Platelets 252; Potassium 3.6; Sodium 141  Recent Lipid Panel    Component Value Date/Time   CHOL 209 (H) 03/30/2019 0830   TRIG 195.0 (H) 03/30/2019 0830   HDL 40.80 03/30/2019 0830   CHOLHDL 5 03/30/2019 0830   VLDL 39.0 03/30/2019 0830   LDLCALC 129 (H) 03/30/2019 0830   LDLDIRECT 142.2 01/01/2013 1113    Physical Exam:    VS:  BP 120/80 (BP Location: Left Arm, Patient Position: Sitting, Cuff Size: Large)   Pulse 79   Ht 5\' 8"  (1.727 m)   Wt 262 lb 8 oz (119.1 kg)   SpO2 99%   BMI 39.91 kg/m     Wt Readings from Last 3 Encounters:  05/15/20 262 lb 8 oz (119.1 kg)  01/29/20 255 lb 3.2 oz (115.8 kg)  12/28/19 238 lb (108 kg)     GEN:  Well nourished, well developed in no acute distress HEENT: Normal NECK: No JVD; No  carotid bruits LYMPHATICS: No lymphadenopathy CARDIAC: RRR, no murmurs, rubs, gallops RESPIRATORY:  Clear to auscultation without rales, wheezing or rhonchi  ABDOMEN: Soft, non-tender, non-distended MUSCULOSKELETAL:  1+ edema; No deformity  SKIN: Warm and dry NEUROLOGIC:  Alert and oriented x 3 PSYCHIATRIC:  Normal affect   ASSESSMENT:    1. Paroxysmal SVT (supraventricular tachycardia) (HCC)   2. Essential hypertension   3. Leg edema    PLAN:    In order of problems listed above:  1. Paroxysmal SVT, status post RF ablation 12/2019.  Symptoms of palpitations resolved, no apparent recurrence.  Appreciate input from electrophysiology.  Continue Lopressor 50 mg twice daily.  Patient with nonspecific chest discomfort with appears noncardiac.  We will consider additional testing if symptoms persist. 2. History of hypertension, BP controlled, start irbesartan 150 mg daily, continue Lopressor. 3. Leg edema, likely from amlodipine side effects.  Stop amlodipine.  Follow-up in 6 weeks.   This note was generated in part or whole with voice recognition software. Voice recognition is usually quite accurate but there are transcription errors that can and very often do occur. I apologize for any typographical errors that were not detected and corrected.  Medication Adjustments/Labs and Tests Ordered: Current medicines are reviewed at length with the patient today.  Concerns regarding medicines are outlined above.  Orders Placed This Encounter  Procedures  . EKG 12-Lead   Meds ordered this encounter  Medications  . irbesartan (AVAPRO) 150 MG tablet    Sig: Take 1 tablet (150 mg total) by mouth daily.    Dispense:  30 tablet    Refill:  5    Patient Instructions  Medication Instructions:   Your physician has recommended you make the following change in your medication:   1.  STOP taking your Norvasc. 2.  START taking Irbesartan (Avapro) 150 MG: Take 1 tab by mouth daily.  *If you  need a refill on your cardiac medications before your next appointment, please call your pharmacy*   Lab Work: None ordered If you have labs (blood work) drawn today and your tests are completely normal, you will receive your results only by: Marland Kitchen MyChart Message (if you have MyChart) OR . A paper copy in the mail If you have any lab test that is abnormal or we need to change your treatment, we will call you to review the results.   Testing/Procedures: None ordered   Follow-Up: At [facility], you and your health needs are our priority.  As part of our continuing mission to provide you with exceptional heart care, we have created designated Provider Care Teams.  These Care Teams include your primary Cardiologist (physician) and Advanced Practice Providers (APPs -  Physician Assistants and Nurse Practitioners) who all work together to provide you with the care you need, when you need it.  We recommend signing up for the patient portal called "MyChart".  Sign up information is provided on this After Visit Summary.  MyChart is used to connect with patients for Virtual Visits (Telemedicine).  Patients are able to view lab/test results, encounter notes, upcoming appointments, etc.  Non-urgent messages can be sent to your provider as well.   To learn more about what you can do with MyChart, go to [email or web address].    Your next appointment:   6 week(s)  The format for your next appointment:   In Person  Provider:   Debbe Odea, MD   Other Instructions       Signed, Debbe Odea, MD  05/15/2020 1:12 PM    [facility]

## 2020-05-15 NOTE — Patient Instructions (Signed)
Medication Instructions:   Your physician has recommended you make the following change in your medication:   1.  STOP taking your Norvasc. 2.  START taking Irbesartan (Avapro) 150 MG: Take 1 tab by mouth daily.  *If you need a refill on your cardiac medications before your next appointment, please call your pharmacy*   Lab Work: None ordered If you have labs (blood work) drawn today and your tests are completely normal, you will receive your results only by: Marland Kitchen MyChart Message (if you have MyChart) OR . A paper copy in the mail If you have any lab test that is abnormal or we need to change your treatment, we will call you to review the results.   Testing/Procedures: None ordered   Follow-Up: At [facility], you and your health needs are our priority.  As part of our continuing mission to provide you with exceptional heart care, we have created designated Provider Care Teams.  These Care Teams include your primary Cardiologist (physician) and Advanced Practice Providers (APPs -  Physician Assistants and Nurse Practitioners) who all work together to provide you with the care you need, when you need it.  We recommend signing up for the patient portal called "MyChart".  Sign up information is provided on this After Visit Summary.  MyChart is used to connect with patients for Virtual Visits (Telemedicine).  Patients are able to view lab/test results, encounter notes, upcoming appointments, etc.  Non-urgent messages can be sent to your provider as well.   To learn more about what you can do with MyChart, go to [email or web address].    Your next appointment:   6 week(s)  The format for your next appointment:   In Person  Provider:   Debbe Odea, MD   Other Instructions

## 2020-06-12 ENCOUNTER — Telehealth: Payer: Self-pay | Admitting: Family Medicine

## 2020-06-12 NOTE — Telephone Encounter (Signed)
Pt called in wanted to know about getting the TDap vaccine due to her daugther had a baby

## 2020-06-12 NOTE — Telephone Encounter (Signed)
Spoke with Annette Romero and advised her that she is up to date on her Tdap.  Last given 01/27/2015.

## 2020-06-26 ENCOUNTER — Ambulatory Visit: Payer: BC Managed Care – PPO | Admitting: Cardiology

## 2020-08-10 ENCOUNTER — Other Ambulatory Visit: Payer: Self-pay | Admitting: Family Medicine

## 2020-08-12 ENCOUNTER — Other Ambulatory Visit: Payer: Self-pay

## 2020-08-12 ENCOUNTER — Other Ambulatory Visit (INDEPENDENT_AMBULATORY_CARE_PROVIDER_SITE_OTHER): Payer: BC Managed Care – PPO

## 2020-08-12 DIAGNOSIS — E785 Hyperlipidemia, unspecified: Secondary | ICD-10-CM | POA: Diagnosis not present

## 2020-08-12 DIAGNOSIS — E559 Vitamin D deficiency, unspecified: Secondary | ICD-10-CM | POA: Diagnosis not present

## 2020-08-12 DIAGNOSIS — Z79899 Other long term (current) drug therapy: Secondary | ICD-10-CM | POA: Diagnosis not present

## 2020-08-12 DIAGNOSIS — Z131 Encounter for screening for diabetes mellitus: Secondary | ICD-10-CM

## 2020-08-12 DIAGNOSIS — E0590 Thyrotoxicosis, unspecified without thyrotoxic crisis or storm: Secondary | ICD-10-CM | POA: Diagnosis not present

## 2020-08-12 LAB — BASIC METABOLIC PANEL
BUN: 12 mg/dL (ref 6–23)
CO2: 28 mEq/L (ref 19–32)
Calcium: 9.3 mg/dL (ref 8.4–10.5)
Chloride: 105 mEq/L (ref 96–112)
Creatinine, Ser: 0.81 mg/dL (ref 0.40–1.20)
GFR: 78.16 mL/min (ref >60.00)
Glucose, Bld: 93 mg/dL (ref 70–99)
Potassium: 4 mEq/L (ref 3.5–5.1)
Sodium: 140 mEq/L (ref 135–145)

## 2020-08-12 LAB — CBC WITH DIFFERENTIAL/PLATELET
Basophils Absolute: 0 10*3/uL (ref 0.0–0.1)
Basophils Relative: 0.5 % (ref 0.0–3.0)
Eosinophils Absolute: 0.1 10*3/uL (ref 0.0–0.7)
Eosinophils Relative: 1 % (ref 0.0–5.0)
HCT: 37.7 % (ref 36.0–46.0)
Hemoglobin: 12.2 g/dL (ref 12.0–15.0)
Lymphocytes Relative: 46.4 % — ABNORMAL HIGH (ref 12.0–46.0)
Lymphs Abs: 3.1 10*3/uL (ref 0.7–4.0)
MCHC: 32.5 g/dL (ref 30.0–36.0)
MCV: 78.2 fl (ref 78.0–100.0)
Monocytes Absolute: 0.5 10*3/uL (ref 0.1–1.0)
Monocytes Relative: 7.7 % (ref 3.0–12.0)
Neutro Abs: 3 10*3/uL (ref 1.4–7.7)
Neutrophils Relative %: 44.4 % (ref 43.0–77.0)
Platelets: 246 10*3/uL (ref 150.0–400.0)
RBC: 4.82 Mil/uL (ref 3.87–5.11)
RDW: 14.2 % (ref 11.5–15.5)
WBC: 6.7 10*3/uL (ref 4.0–10.5)

## 2020-08-12 LAB — VITAMIN D 25 HYDROXY (VIT D DEFICIENCY, FRACTURES): VITD: 28.19 ng/mL — ABNORMAL LOW (ref 30.00–100.00)

## 2020-08-12 LAB — TSH: TSH: 0.64 u[IU]/mL (ref 0.35–4.50)

## 2020-08-12 LAB — LIPID PANEL
Cholesterol: 159 mg/dL (ref 0–200)
HDL: 43.8 mg/dL (ref >39.00)
LDL Cholesterol: 102 mg/dL — ABNORMAL HIGH (ref 0–99)
NonHDL: 115.16
Total CHOL/HDL Ratio: 4
Triglycerides: 66 mg/dL (ref 0.0–149.0)
VLDL: 13.2 mg/dL (ref 0.0–40.0)

## 2020-08-12 LAB — HEPATIC FUNCTION PANEL
ALT: 13 U/L (ref 0–35)
AST: 19 U/L (ref 0–37)
Albumin: 4.1 g/dL (ref 3.5–5.2)
Alkaline Phosphatase: 107 U/L (ref 39–117)
Bilirubin, Direct: 0.1 mg/dL (ref 0.0–0.3)
Total Bilirubin: 0.3 mg/dL (ref 0.2–1.2)
Total Protein: 7.1 g/dL (ref 6.0–8.3)

## 2020-08-12 LAB — HEMOGLOBIN A1C: Hgb A1c MFr Bld: 6.9 % — ABNORMAL HIGH (ref 4.6–6.5)

## 2020-08-13 ENCOUNTER — Other Ambulatory Visit: Payer: Self-pay | Admitting: Family Medicine

## 2020-08-13 NOTE — Telephone Encounter (Signed)
Last office visit 07/19/2019 for  Syncope and collapse/fall.  Last refilled 02/18/2020 for #90 with 1 refill.  CPE scheduled 08/18/2020.

## 2020-08-18 ENCOUNTER — Ambulatory Visit (INDEPENDENT_AMBULATORY_CARE_PROVIDER_SITE_OTHER): Payer: BC Managed Care – PPO | Admitting: Family Medicine

## 2020-08-18 ENCOUNTER — Other Ambulatory Visit: Payer: Self-pay

## 2020-08-18 ENCOUNTER — Encounter: Payer: Self-pay | Admitting: Family Medicine

## 2020-08-18 VITALS — BP 140/86 | HR 92 | Temp 97.9°F | Ht 67.5 in | Wt 266.8 lb

## 2020-08-18 DIAGNOSIS — E119 Type 2 diabetes mellitus without complications: Secondary | ICD-10-CM

## 2020-08-18 DIAGNOSIS — Z0000 Encounter for general adult medical examination without abnormal findings: Secondary | ICD-10-CM

## 2020-08-18 HISTORY — DX: Type 2 diabetes mellitus without complications: E11.9

## 2020-08-18 NOTE — Patient Instructions (Signed)
  You do not need a referral to make a mammogram appointment, and you may call to make her own mammogram appointment directly around your schedule.  MAMMOGRAPHY IN [place]:  [facility] [phone] [address] [place], [place] [address]  [facility] (Formerly [facility]) [address] [place], [place] [address] Phone: [phone] Toll Free: [phone]  MAMMOGRAPHY IN [place]:  [facility] ([place] or [place]) [phone] Located on the campus of [facility] ([place])  [facility] ([place] Location) [address]  [place], [place] [address]  

## 2020-08-18 NOTE — Progress Notes (Signed)
Annette Murray T. Keirah Konitzer, MD, CAQ Sports Medicine  Primary Care and Sports Medicine [facility] [address] [place], [id number]  Phone: [phone]  FAX: [phone]  Annette Romero - 60 y.o. female  MRN [id number]  Date of Birth: 02-Jun-1960  Date: 08/18/2020  PCP: Hannah Beatopland, Kentravious Lipford, MD  Referral: Hannah Beatopland, Kalianna Verbeke, MD  Chief Complaint  Patient presents with  . Annual Exam    This visit occurred during the SARS-CoV-2 public health emergency.  Safety protocols were in place, including screening questions prior to the visit, additional usage of staff PPE, and extensive cleaning of exam room while observing appropriate contact time as indicated for disinfecting solutions.   Patient Care Team: Hannah Beatopland, Zannie Runkle, MD as PCP - General (Family Medicine) Debbe OdeaAgbor-Etang, Brian, MD as PCP - Cardiology (Cardiology) Laurena Slimmerlark, Preston S, MD (Inactive) as Consulting Physician (Endocrinology) Subjective:   Annette Romero is a 60 y.o. pleasant patient who presents with the following:  Health Maintenance Summary Reviewed and updated, unless pt declines services.  Tobacco History Reviewed. Non-smoker Alcohol: No concerns, no excessive use Exercise Habits: Some activity, rec at least 30 mins 5 times a week STD concerns: none Drug Use: None Lumps or breast concerns: no  Mammo - needed, 2019 needs f/u Covid - has had #2, checked weekly S/p hyst  New onset DM -  Her hemoglobin A1c is 6.9 New onset diabetes, she has gained about 40 pounds over the last few years. She does work third shift and works from 11 PM to 7 AM.  She then goes home and goes to sleep.  Now she is working in a retirement community, and her job is relatively easier. She has been eating a lot worse over recent years as well. She has not very mindful of her intake. She denies other significant symptoms such as blurred vision, polyuria.  Can sleep all day   Health Maintenance  Topic Date  Due  . PNEUMOCOCCAL POLYSACCHARIDE VACCINE AGE 37-64 HIGH RISK  Never done  . FOOT EXAM  Never done  . OPHTHALMOLOGY EXAM  Never done  . MAMMOGRAM  06/23/2019  . COVID-19 Vaccine (3 - Booster for Moderna series) 11/27/2019  . INFLUENZA VACCINE  11/10/2020  . HEMOGLOBIN A1C  02/12/2021  . TETANUS/TDAP  01/26/2025  . COLONOSCOPY (Pts [id number] Insurance coverage will need to be confirmed)  06/10/2027  . Hepatitis C Screening  Completed  . HIV Screening  Completed  . HPV VACCINES  Aged Out    Immunization History  Administered Date(s) Administered  . Influenza,inj,Quad PF,6+ Mos 01/27/2015  . Influenza-Unspecified 01/20/2016, 01/10/2018  . Moderna Sars-Covid-2 Vaccination 05/02/2019, 05/30/2019  . Tdap 01/27/2015   Patient Active Problem List   Diagnosis Date Noted  . Controlled type 2 diabetes mellitus without complication, without long-term current use of insulin (HCC) 08/18/2020  . Paroxysmal SVT (supraventricular tachycardia) (HCC) 10/11/2013  . Hyperthyroidism   . Vitamin D deficiency   . Hypertension   . GERD (gastroesophageal reflux disease)     Past Medical History:  Diagnosis Date  . Closed right trimalleolar fracture 04/30/2016  . Controlled type 2 diabetes mellitus without complication, without long-term current use of insulin (HCC) 08/18/2020  . GERD (gastroesophageal reflux disease)   . Hypertension   . Hyperthyroidism   . Tachycardia    with increased thyroid level  . Vitamin D deficiency     Past Surgical History:  Procedure Laterality Date  . ABDOMINAL HYSTERECTOMY    . BREAST BIOPSY  1974   breast cyst  . BREAST CYST ASPIRATION    . BREAST EXCISIONAL BIOPSY Left    at age of 1  . CESAREAN SECTION  1991  . HARDWARE REMOVAL Right 04/28/2017   Procedure: HARDWARE REMOVAL-RIGHT ANKLE;  Surgeon: Kennedy Bucker, MD;  Location: [facility] ORS;  Service: Orthopedics;  Laterality: Right;  . ORIF ANKLE FRACTURE Right 04/30/2016   Procedure: OPEN REDUCTION INTERNAL  FIXATION (ORIF) ANKLE FRACTURE;  Surgeon: Kennedy Bucker, MD;  Location: [facility] ORS;  Service: Orthopedics;  Laterality: Right;  . SVT ABLATION N/A 12/28/2019   Procedure: SVT ABLATION;  Surgeon: Marinus Maw, MD;  Location: [facility] INVASIVE CV LAB;  Service: Cardiovascular;  Laterality: N/A;  . VAGINAL HYSTERECTOMY  2001   heavy bleeding due to fibroids, not cancer    Family History  Problem Relation Age of Onset  . CVA Father   . Heart attack Father   . Heart failure Father   . Seizures Mother   . Drug abuse Brother        d/c from drugs  . Breast cancer Neg Hx     Past Medical History, Surgical History, Social History, Family History, Problem List, Medications, and Allergies have been reviewed and updated if relevant.  Review of Systems: Pertinent positives are listed above.  Otherwise, a full 14 point review of systems has been done in full and it is negative except where it is noted positive.  Objective:   BP 140/86   Pulse 92   Temp 97.9 F (36.6 C) (Temporal)   Ht 5' 7.5" (1.715 m)   Wt 266 lb 12 oz (121 kg)   SpO2 98%   BMI 41.16 kg/m  Ideal Body Weight: Weight in (lb) to have BMI = 25: 161.7 No exam data present Depression screen [facility] 2/9 08/18/2020  Decreased Interest 0  Down, Depressed, Hopeless 0  PHQ - 2 Score 0     GEN: well developed, well nourished, no acute distress Eyes: conjunctiva and lids normal, PERRLA, EOMI ENT: TM clear, nares clear, oral exam WNL Neck: supple, no lymphadenopathy, no thyromegaly, no JVD Pulm: clear to auscultation and percussion, respiratory effort normal CV: regular rate and rhythm, S1-S2, no murmur, rub or gallop, no bruits Chest: no scars, masses, no lumps BREAST: breast exam declined GI: soft, non-tender; no hepatosplenomegaly, masses; active bowel sounds all quadrants GU: GU exam declined Lymph: no cervical, axillary or inguinal adenopathy MSK: gait normal, muscle tone and strength WNL, no joint swelling, effusions,  discoloration, crepitus  SKIN: clear, good turgor, color WNL, no rashes, lesions, or ulcerations Neuro: normal mental status, normal strength, sensation, and motion Psych: alert; oriented to person, place and time, normally interactive and not anxious or depressed in appearance.   All labs reviewed with patient. Results for orders placed or performed in visit on 08/12/20  Lipid panel  Result Value Ref Range   Cholesterol 159 0 - 200 mg/dL   Triglycerides 14.4 0.0 - 149.0 mg/dL   HDL 81.85 >63.14 mg/dL   VLDL 97.0 0.0 - 26.3 mg/dL   LDL Cholesterol 785 (H) 0 - 99 mg/dL   Total CHOL/HDL Ratio 4    NonHDL 115.16   Hepatic function panel  Result Value Ref Range   Total Bilirubin 0.3 0.2 - 1.2 mg/dL   Bilirubin, Direct 0.1 0.0 - 0.3 mg/dL   Alkaline Phosphatase 107 39 - 117 U/L   AST 19 0 - 37 U/L   ALT 13 0 - 35 U/L  Total Protein 7.1 6.0 - 8.3 g/dL   Albumin 4.1 3.5 - 5.2 g/dL  CBC with Differential/Platelet  Result Value Ref Range   WBC 6.7 4.0 - 10.5 K/uL   RBC 4.82 3.87 - 5.11 Mil/uL   Hemoglobin 12.2 12.0 - 15.0 g/dL   HCT 03.5 46.5 - 68.1 %   MCV 78.2 78.0 - 100.0 fl   MCHC 32.5 30.0 - 36.0 g/dL   RDW 27.5 17.0 - 01.7 %   Platelets 246.0 150.0 - 400.0 K/uL   Neutrophils Relative % 44.4 43.0 - 77.0 %   Lymphocytes Relative 46.4 (H) 12.0 - 46.0 %   Monocytes Relative 7.7 3.0 - 12.0 %   Eosinophils Relative 1.0 0.0 - 5.0 %   Basophils Relative 0.5 0.0 - 3.0 %   Neutro Abs 3.0 1.4 - 7.7 K/uL   Lymphs Abs 3.1 0.7 - 4.0 K/uL   Monocytes Absolute 0.5 0.1 - 1.0 K/uL   Eosinophils Absolute 0.1 0.0 - 0.7 K/uL   Basophils Absolute 0.0 0.0 - 0.1 K/uL  Basic metabolic panel  Result Value Ref Range   Sodium 140 135 - 145 mEq/L   Potassium 4.0 3.5 - 5.1 mEq/L   Chloride 105 96 - 112 mEq/L   CO2 28 19 - 32 mEq/L   Glucose, Bld 93 70 - 99 mg/dL   BUN 12 6 - 23 mg/dL   Creatinine, Ser 4.94 0.40 - 1.20 mg/dL   GFR 49.67 >59.16 mL/min   Calcium 9.3 8.4 - 10.5 mg/dL  TSH   Result Value Ref Range   TSH 0.64 0.35 - 4.50 uIU/mL  VITAMIN D 25 Hydroxy (Vit-D Deficiency, Fractures)  Result Value Ref Range   VITD 28.19 (L) 30.00 - 100.00 ng/mL  Hemoglobin A1c  Result Value Ref Range   Hgb A1c MFr Bld 6.9 (H) 4.6 - 6.5 %   No results found.  Assessment and Plan:     ICD-10-CM   1. Healthcare maintenance  Z00.00   2. Controlled type 2 diabetes mellitus without complication, without long-term current use of insulin (HCC)  E11.9 Ambulatory referral to diabetic education   Keep working on general fitness and weight loss Mammogram referral Continue vitamin D supplementation She has not had her COVID #3, but she is tested weekly for work. I did encourage her to get this.  Total encounter time for DM: 15 minutes. This includes total time spent on the day of encounter.  We spent a significant portion of the visit talking about her new onset diabetes, diabetes pathophysiology.  We did some diet review and basic counseling about diabetes.  Am can have her start doing an diabetic education.  I think for now, it is reasonable for her to stay off of medications and do a trial of lifestyle changes.  I will recheck her in 3 months time.  Health Maintenance Exam: The patient's preventative maintenance and recommended screening tests for an annual wellness exam were reviewed in full today. Brought up to date unless services declined.  Counselled on the importance of diet, exercise, and its role in overall health and mortality. The patient's FH and SH was reviewed, including their home life, tobacco status, and drug and alcohol status.  Follow-up in 1 year for physical exam or additional follow-up below.  Follow-up: Return in about 3 months (around 11/18/2020) for Diabetes follow-up. Or follow-up in 1 year if not noted.  Future Appointments  Date Time Provider Department Center  11/19/2020  8:20 AM Barb Shear, Karleen Hampshire, MD  [facility]    No orders of the defined types were  placed in this encounter.  Medications Discontinued During This Encounter  Medication Reason  . metoprolol tartrate (LOPRESSOR) 50 MG tablet Change in therapy  . TURMERIC PO Change in therapy   Orders Placed This Encounter  Procedures  . Ambulatory referral to diabetic education    Signed,  Karleen Hampshire T. Sircharles Holzheimer, MD   Allergies as of 08/18/2020      Reactions   Zantac [ranitidine Hcl] Rash      Medication List       Accurate as of Aug 18, 2020 11:59 PM. If you have any questions, ask your nurse or doctor.        STOP taking these medications   TURMERIC PO Stopped by: Hannah Beat, MD     TAKE these medications   aspirin EC 81 MG tablet Take 81 mg by mouth daily.   cyanocobalamin 1000 MCG tablet Take 1,000 mcg by mouth daily.   HIMALAYAN GOJI PO Take by mouth daily.   irbesartan 150 MG tablet Commonly known as: Avapro Take 1 tablet (150 mg total) by mouth daily.   IRON PO Take 1 tablet by mouth daily.   meloxicam 15 MG tablet Commonly known as: MOBIC TAKE 1 TABLET (15 MG TOTAL) BY MOUTH AT BEDTIME AS NEEDED FOR PAIN.   metoprolol tartrate 100 MG tablet Commonly known as: LOPRESSOR Take 100 mg by mouth 2 (two) times daily. What changed: Another medication with the same name was removed. Continue taking this medication, and follow the directions you see here. Changed by: Hannah Beat, MD   multivitamin with minerals Tabs tablet Take 1 tablet by mouth daily.   OVER THE COUNTER MEDICATION Goli Apple Cider Gummy-2 gummy daily   OVER THE COUNTER MEDICATION Immetri-2 tablets by mouth daily   OVER THE COUNTER MEDICATION Magnesium, Zinc & Vit D3-2 tablets by mouth two times a day   rosuvastatin 20 MG tablet Commonly known as: CRESTOR Take 20 mg by mouth at bedtime.   SUMAtriptan 50 MG tablet Commonly known as: IMITREX Take 1 tablet (50 mg total) by mouth every 2 (two) hours as needed for migraine or headache.   traZODone 150 MG tablet Commonly  known as: DESYREL TAKE 1 TABLET (150 MG TOTAL) BY MOUTH AT BEDTIME AS NEEDED FOR SLEEP.   Turmeric Powd by Does not apply route.   vitamin E 1000 UNIT capsule Take 2,000 Units by mouth daily.

## 2020-11-07 ENCOUNTER — Other Ambulatory Visit: Payer: Self-pay

## 2020-11-07 MED ORDER — IRBESARTAN 150 MG PO TABS: 150.0000 mg | ORAL_TABLET | Freq: Every day | ORAL | 0 refills | Status: DC

## 2020-11-08 ENCOUNTER — Other Ambulatory Visit: Payer: Self-pay | Admitting: Family Medicine

## 2020-11-19 ENCOUNTER — Ambulatory Visit: Payer: BC Managed Care – PPO | Admitting: Family Medicine

## 2021-01-07 ENCOUNTER — Other Ambulatory Visit: Payer: Self-pay | Admitting: Family Medicine

## 2021-01-07 NOTE — Telephone Encounter (Signed)
Please schedule Diabetes follow up with Dr. Patsy Lager.  She was suppose to follow up in August.

## 2021-01-07 NOTE — Telephone Encounter (Signed)
CALLED PAT AND LEFT VM 

## 2021-01-09 NOTE — Telephone Encounter (Signed)
2nd attemp lvm to call us to schedule apt.

## 2021-01-23 DIAGNOSIS — Z713 Dietary counseling and surveillance: Secondary | ICD-10-CM | POA: Diagnosis not present

## 2021-02-05 ENCOUNTER — Other Ambulatory Visit: Payer: Self-pay | Admitting: Family Medicine

## 2021-02-05 NOTE — Telephone Encounter (Signed)
LAST APPOINTMENT DATE: 08/18/2020   NEXT APPOINTMENT DATE: N/a patient has not scheduled next visit with provider.   LAST REFILL: 08/13/2020  QTY: 90 tablets 1 refill.

## 2021-04-03 ENCOUNTER — Other Ambulatory Visit: Payer: Self-pay | Admitting: Family Medicine

## 2021-04-03 NOTE — Telephone Encounter (Signed)
Last office visit 08/18/20 for CPE.  Last refilled 11/07/2020 for #30 by Dr. Azucena Cecil with note appt needed for further refills.  No future appointments with PCP or Cardiologist.  Patient was due to follow up with Dr. Patsy Lager in August for DM.  Refill?

## 2021-04-03 NOTE — Addendum Note (Signed)
Addended by: Damita Lack on: 04/03/2021 12:26 PM   Modules accepted: Orders

## 2021-04-03 NOTE — Telephone Encounter (Signed)
Pt called stated she need's her PB medication requesting refills on RX irbesartan (AVAPRO) 150 MG tablet  . Please advise # [phone]

## 2021-04-04 ENCOUNTER — Other Ambulatory Visit: Payer: Self-pay | Admitting: Family Medicine

## 2021-04-06 MED ORDER — IRBESARTAN 150 MG PO TABS: 150.0000 mg | ORAL_TABLET | Freq: Every day | ORAL | 1 refills | Status: DC

## 2021-04-06 NOTE — Telephone Encounter (Signed)
She has been on an ARB for a long time.  Will refill.

## 2021-04-28 ENCOUNTER — Other Ambulatory Visit: Payer: Self-pay

## 2021-04-28 ENCOUNTER — Telehealth: Payer: Self-pay | Admitting: Family Medicine

## 2021-04-28 ENCOUNTER — Telehealth (INDEPENDENT_AMBULATORY_CARE_PROVIDER_SITE_OTHER): Payer: BC Managed Care – PPO | Admitting: Family Medicine

## 2021-04-28 ENCOUNTER — Encounter: Payer: Self-pay | Admitting: Family Medicine

## 2021-04-28 VITALS — BP 162/83 | HR 87 | Temp 99.3°F | Ht 67.5 in

## 2021-04-28 DIAGNOSIS — I10 Essential (primary) hypertension: Secondary | ICD-10-CM | POA: Diagnosis not present

## 2021-04-28 DIAGNOSIS — E119 Type 2 diabetes mellitus without complications: Secondary | ICD-10-CM | POA: Diagnosis not present

## 2021-04-28 DIAGNOSIS — U071 COVID-19: Secondary | ICD-10-CM

## 2021-04-28 NOTE — Telephone Encounter (Signed)
Got pt scheduled for 2/23 @8 

## 2021-04-28 NOTE — Progress Notes (Signed)
Patient ID: Annette Romero, female    DOB: [birth year], 61 y.o.   MRN: [id number]  Virtual visit completed through [place], a video enabled telemedicine application. Due to national recommendations of social distancing due to COVID-19, a virtual visit is felt to be most appropriate for this patient at this time. Reviewed limitations, risks, security and privacy concerns of performing a virtual visit and the availability of in person appointments. I also reviewed that there may be a patient responsible charge related to this service. The patient agreed to proceed.   Patient location: home Provider location: [facility], office Persons participating in this virtual visit: patient, provider   If any vitals were documented, they were collected by patient at home unless specified below.    BP (!) 162/83 (BP Location: Left Arm, Cuff Size: Large)    Pulse 87    Temp 99.3 F (37.4 C)    Ht 5' 7.5" (1.715 m)    SpO2 98% Comment: RA   BMI 41.16 kg/m   BP Readings from Last 3 Encounters:  04/28/21 (!) 162/83  08/18/20 140/86  05/15/20 120/80    CC: COVID Subjective:   HPI: Annette Romero is a 61 y.o. female presenting on 04/28/2021 for Covid Positive (C/o body aches, fever- max 103.9, ST, nasal congestion, fatigue and HA. Sxs started 04/25/20. Pos home COVID test 04/27/21. )   First day of symptoms: 1/13/203 Tested COVID positive: 04/27/2021  Current symptoms: fever (Tmax 103.9), HA, elevated blood pressures, nasal congestion, body aches, dry cough, loss of taste.   No: abd pain, nausea or diarrhea, loss smell, dyspnea or wheezing.  Treatments to date: theraflu (tylenol, dextromethorphan, phenylephrine), coricedin, ginger tea.  Risk factors include: weight, hypertension, diabetes (doesn't check sugars at home)   Works at nursing home.   COVID vaccination status: Moderna 04/2019, 05/2019, no boosters  Normally takes metoprolol 100mg  bid, irbesartan 150mg  daily - BP staying high.  H/o cardiac ablation.      Relevant past medical, surgical, family and social history reviewed and updated as indicated. Interim medical history since our last visit reviewed. Allergies and medications reviewed and updated. Outpatient Medications Prior to Visit  Medication Sig Dispense Refill   aspirin EC 81 MG tablet Take 81 mg by mouth daily.     cyanocobalamin 1000 MCG tablet Take 1,000 mcg by mouth daily.     Ferrous Sulfate (IRON PO) Take 1 tablet by mouth daily.     irbesartan (AVAPRO) 150 MG tablet Take 1 tablet (150 mg total) by mouth daily. 90 tablet 1   meloxicam (MOBIC) 15 MG tablet TAKE 1 TABLET (15 MG TOTAL) BY MOUTH AT BEDTIME AS NEEDED FOR PAIN. 90 tablet 1   metoprolol tartrate (LOPRESSOR) 100 MG tablet TAKE 1 TABLET BY MOUTH TWICE A DAY 180 tablet 0   Misc Natural Products (HIMALAYAN GOJI PO) Take by mouth daily.      Multiple Vitamin (MULTIVITAMIN WITH MINERALS) TABS tablet Take 1 tablet by mouth daily.     OVER THE COUNTER MEDICATION Goli Apple Cider Gummy-2 gummy daily      OVER THE COUNTER MEDICATION Immetri-2 tablets by mouth daily     OVER THE COUNTER MEDICATION Magnesium, Zinc & Vit D3-2 tablets by mouth two times a day     rosuvastatin (CRESTOR) 20 MG tablet Take 20 mg by mouth at bedtime.      SUMAtriptan (IMITREX) 50 MG tablet Take 1 tablet (50 mg total) by mouth every 2 (two) hours as  needed for migraine or headache. 10 tablet 5   traZODone (DESYREL) 150 MG tablet TAKE 1 TABLET (150 MG TOTAL) BY MOUTH AT BEDTIME AS NEEDED FOR SLEEP. 90 tablet 0   Turmeric POWD by Does not apply route.     vitamin E 1000 UNIT capsule Take 2,000 Units by mouth daily.      No facility-administered medications prior to visit.     Per HPI unless specifically indicated in ROS section below Review of Systems Objective:  BP (!) 162/83 (BP Location: Left Arm, Cuff Size: Large)    Pulse 87    Temp 99.3 F (37.4 C)    Ht 5' 7.5" (1.715 m)    SpO2 98% Comment: RA   BMI 41.16 kg/m   Wt  Readings from Last 3 Encounters:  08/18/20 266 lb 12 oz (121 kg)  05/15/20 262 lb 8 oz (119.1 kg)  01/29/20 255 lb 3.2 oz (115.8 kg)       Physical exam: Gen: alert, NAD, not ill appearing Pulm: speaks in complete sentences without increased work of breathing Psych: normal mood, normal thought content      Lab Results  Component Value Date   CREATININE 0.81 08/12/2020   BUN 12 08/12/2020   NA 140 08/12/2020   K 4.0 08/12/2020   CL 105 08/12/2020   CO2 28 08/12/2020   GFR = 78 Lab Results  Component Value Date   HGBA1C 6.9 (H) 08/12/2020    Assessment & Plan:   Problem List Items Addressed This Visit     Hypertension (Chronic)    BP elevated today - she is acutely ill with COVID and had been taking decongestant. Advised stop decongestant and monitor blood pressures at home as she feels better, to let us know if persistently elevated after recovery.       Controlled type 2 diabetes mellitus without complication, without long-term current use of insulin (HCC) (Chronic)   COVID-19 virus infection - Primary    Reviewed expected course of illness, anticipated course of recovery, as well as red flags to suggest COVID pneumonia and/or to seek urgent in-person care.  Reviewed latest CDC isolation/quarantine guidelines.  Encouraged fluids and rest. Reviewed further supportive care measures at home including vit C 500mg  bid, vit D 2000 IU daily, zinc 100mg  daily, tylenol PRN, pepcid 20mg  BID PRN.   Recommend:  Reviewed currently approved EUA treatments as well as possible side effects.  She is eligible for full dose paxlovid which was offered - however she declines at this time. She is aware if she changes her mind that she is eligible to start it over the next 24 hours as well.  Paxlovid drug interactions:  1. Rosuvastatin - would hold while on it 2. Triptan - likely no clinically significant effect 3. Trazodone - would monitor effect, consider lower dose while on it        No  orders of the defined types were placed in this encounter.  No orders of the defined types were placed in this encounter.   I discussed the assessment and treatment plan with the patient. The patient was provided an opportunity to ask questions and all were answered. The patient agreed with the plan and demonstrated an understanding of the instructions. The patient was advised to call back or seek an in-person evaluation if the symptoms worsen or if the condition fails to improve as anticipated.  Follow up plan: Return if symptoms worsen or fail to improve.  Ria Bush, MD

## 2021-04-28 NOTE — Telephone Encounter (Signed)
Please schedule office visit with Dr. Lorelei Pont to follow up on diabetes.  She was suppose to follow up with him back in August.

## 2021-04-28 NOTE — Assessment & Plan Note (Addendum)
Reviewed expected course of illness, anticipated course of recovery, as well as red flags to suggest COVID pneumonia and/or to seek urgent in-person care.  Reviewed latest CDC isolation/quarantine guidelines.  Encouraged fluids and rest. Reviewed further supportive care measures at home including vit C 500mg  bid, vit D 2000 IU daily, zinc 100mg  daily, tylenol PRN, pepcid 20mg  BID PRN.   Recommend:  Reviewed currently approved EUA treatments as well as possible side effects.  She is eligible for full dose paxlovid which was offered - however she declines at this time. She is aware if she changes her mind that she is eligible to start it over the next 24 hours as well.  Paxlovid drug interactions:  1. Rosuvastatin - would hold while on it 2. Triptan - likely no clinically significant effect 3. Trazodone - would monitor effect, consider lower dose while on it

## 2021-04-28 NOTE — Assessment & Plan Note (Signed)
BP elevated today - she is acutely ill with COVID and had been taking decongestant. Advised stop decongestant and monitor blood pressures at home as she feels better, to let us know if persistently elevated after recovery.

## 2021-05-29 DIAGNOSIS — Z713 Dietary counseling and surveillance: Secondary | ICD-10-CM | POA: Diagnosis not present

## 2021-06-04 ENCOUNTER — Other Ambulatory Visit: Payer: Self-pay

## 2021-06-04 ENCOUNTER — Encounter: Payer: Self-pay | Admitting: Family Medicine

## 2021-06-04 ENCOUNTER — Ambulatory Visit: Payer: BC Managed Care – PPO | Admitting: Family Medicine

## 2021-06-04 VITALS — BP 120/80 | HR 63 | Temp 98.0°F | Ht 67.5 in | Wt 265.2 lb

## 2021-06-04 DIAGNOSIS — E119 Type 2 diabetes mellitus without complications: Secondary | ICD-10-CM

## 2021-06-04 LAB — POCT GLYCOSYLATED HEMOGLOBIN (HGB A1C): Hemoglobin A1C: 6.6 % — AB (ref 4.0–5.6)

## 2021-06-04 NOTE — Progress Notes (Signed)
??? ?? ????? ?? ?? ? ?? ??  ??? ???   6???? ?? ????? ??? ?????. 6??? ???? ??? ?? ? ?? ?? ??? ??????

## 2021-06-04 NOTE — Progress Notes (Signed)
Mackinzie Vuncannon T. Tauheedah Bok, MD, CAQ Sports Medicine [facility] at [facility] [address] [place], [address]  Phone: [phone]   FAX: [phone]  Annette Romero - 61 y.o. female   MRN [id number]   Date of Birth: [birth year]  Date: 06/04/2021   PCP: Hannah Beat, MD   Referral: Hannah Beat, MD  Chief Complaint  Patient presents with   Diabetes    This visit occurred during the SARS-CoV-2 public health emergency.  Safety protocols were in place, including screening questions prior to the visit, additional usage of staff PPE, and extensive cleaning of exam room while observing appropriate contact time as indicated for disinfecting solutions.   Subjective:   Annette Romero is a 61 y.o. very pleasant female patient with Body mass index is 40.93 kg/m. who presents with the following:  January, had covid. 10 d after christmas had it.  Now Now sore throat.  Will have a bottle of water, and still feels tired. b  Diabetes Mellitus: Tolerating Medications: yes Compliance with diet: fair, Body mass index is 40.93 kg/m. Exercise: minimal / intermittent Avg blood sugars at home: not checking Foot problems: none Hypoglycemia: none No nausea, vomitting, blurred vision, polyuria.  No meds Had been doing intermittent fasting .  Lab Results  Component Value Date   HGBA1C 6.6 (A) 06/04/2021   HGBA1C 6.9 (H) 08/12/2020   HGBA1C 6.3 03/30/2019   Lab Results  Component Value Date   LDLCALC 102 (H) 08/12/2020   CREATININE 0.81 08/12/2020    Wt Readings from Last 3 Encounters:  06/04/21 265 lb 4 oz (120.3 kg)  08/18/20 266 lb 12 oz (121 kg)  05/15/20 262 lb 8 oz (119.1 kg)     Review of Systems is noted in the HPI, as appropriate  Objective:   BP 120/80    Pulse 63    Temp 98 F (36.7 C) (Temporal)    Ht 5' 7.5" (1.715 m)    Wt 265 lb 4 oz (120.3 kg)    SpO2 97%    BMI 40.93 kg/m   GEN: No acute distress; alert,appropriate. PULM: Breathing  comfortably in no respiratory distress PSYCH: Normally interactive.  CV: RRR, no m/g/r   Laboratory and Imaging Data: Results for orders placed or performed in visit on 06/04/21  POCT glycosylated hemoglobin (Hb A1C)  Result Value Ref Range   Hemoglobin A1C 6.6 (A) 4.0 - 5.6 %   HbA1c POC (<> result, manual entry)     HbA1c, POC (prediabetic range)     HbA1c, POC (controlled diabetic range)       Assessment and Plan:     ICD-10-CM   1. Controlled type 2 diabetes mellitus without complication, without long-term current use of insulin (HCC)  E11.9 POCT glycosylated hemoglobin (Hb A1C)     Stable diet controlled diabetes.  Continue to work on weight loss.  She is having some persistent shortness of breath after COVID.  We reviewed long COVID, she does seem to slowly be improving.  No orders of the defined types were placed in this encounter.  Medications Discontinued During This Encounter  Medication Reason   Multiple Vitamin (MULTIVITAMIN WITH MINERALS) TABS tablet Patient Preference   Misc Natural Products (HIMALAYAN GOJI PO) Patient Preference   OVER THE COUNTER MEDICATION Patient Preference   Orders Placed This Encounter  Procedures   POCT glycosylated hemoglobin (Hb A1C)    Follow-up: Return in about 6 months (around 12/02/2021) for Health Maintenance Exam.  Reubin Milan  [employer] speech-to-text software was used for transcription in this dictation.  Possible transcriptional errors can occur using Animal nutritionist.   Signed,  Elpidio Galea. Shareece Bultman, MD   Outpatient Encounter Medications as of 06/04/2021  Medication Sig   aspirin EC 81 MG tablet Take 81 mg by mouth daily.   cyanocobalamin 1000 MCG tablet Take 1,000 mcg by mouth daily.   Ferrous Sulfate (IRON PO) Take 1 tablet by mouth daily.   GLUCOSAMINE-CHONDROITIN PO Take 2 tablets by mouth daily after supper.   irbesartan (AVAPRO) 150 MG tablet Take 1 tablet (150 mg total) by mouth daily.   meloxicam (MOBIC) 15 MG  tablet TAKE 1 TABLET (15 MG TOTAL) BY MOUTH AT BEDTIME AS NEEDED FOR PAIN.   metoprolol tartrate (LOPRESSOR) 100 MG tablet TAKE 1 TABLET BY MOUTH TWICE A DAY   OVER THE COUNTER MEDICATION Immetri-2 tablets by mouth daily   OVER THE COUNTER MEDICATION Magnesium, Zinc & Vit D3-2 tablets by mouth two times a day   rosuvastatin (CRESTOR) 20 MG tablet Take 20 mg by mouth at bedtime.    SUMAtriptan (IMITREX) 50 MG tablet Take 1 tablet (50 mg total) by mouth every 2 (two) hours as needed for migraine or headache.   traZODone (DESYREL) 150 MG tablet TAKE 1 TABLET (150 MG TOTAL) BY MOUTH AT BEDTIME AS NEEDED FOR SLEEP.   Turmeric POWD 1 each by Does not apply route daily. One tbsp every day   vitamin E 1000 UNIT capsule Take 2,000 Units by mouth daily.    [DISCONTINUED] Misc Natural Products (HIMALAYAN GOJI PO) Take by mouth daily.    [DISCONTINUED] Multiple Vitamin (MULTIVITAMIN WITH MINERALS) TABS tablet Take 1 tablet by mouth daily.   [DISCONTINUED] OVER THE COUNTER MEDICATION Goli Apple Cider Gummy-2 gummy daily    No facility-administered encounter medications on file as of 06/04/2021.

## 2021-06-26 IMAGING — DX DG CHEST 2V
2 series · 2 of 2 positions shown · non-contrast
Comparison: None.

CLINICAL DATA: Palpitations

EXAM:
CHEST - 2 VIEW

[chest pa]
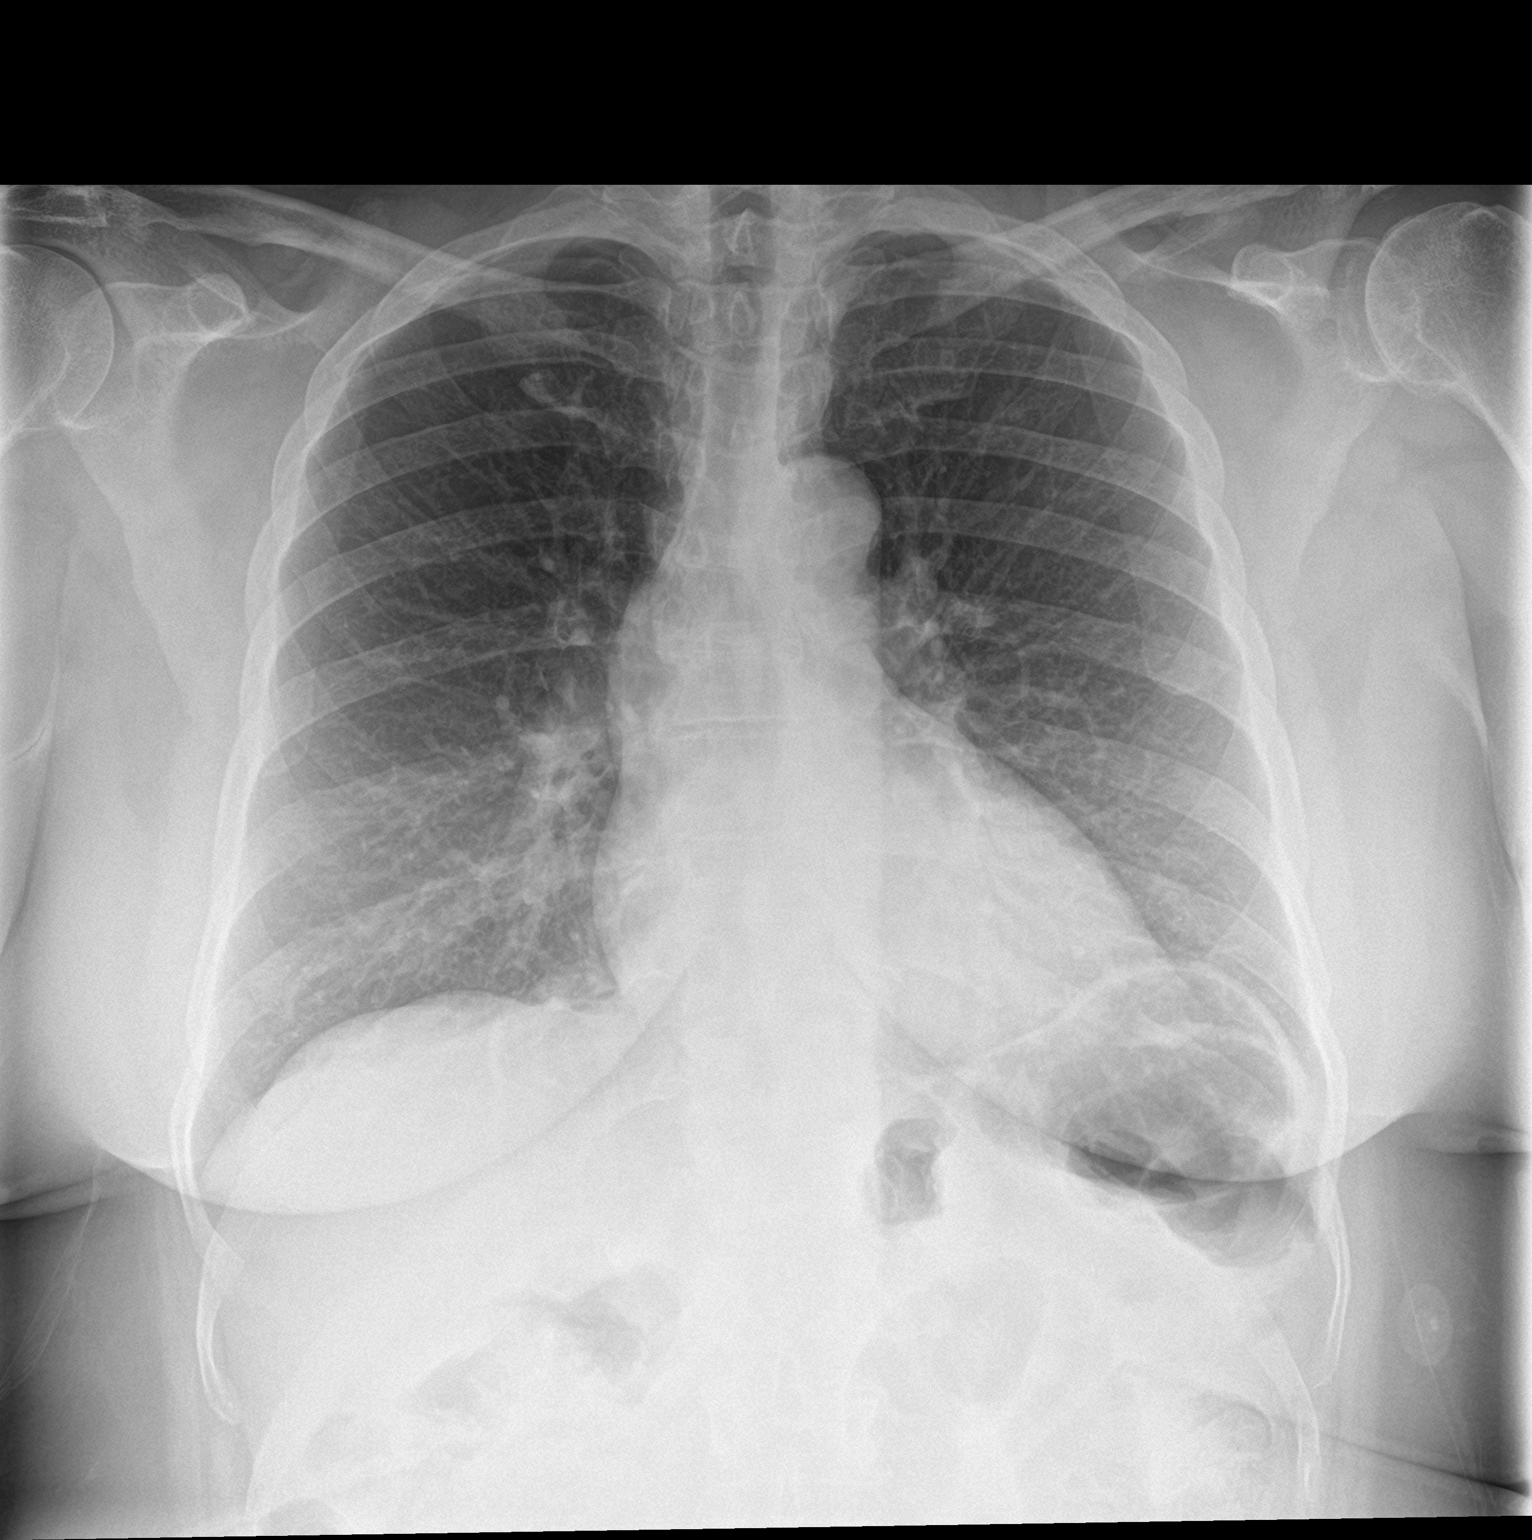

[chest lat]
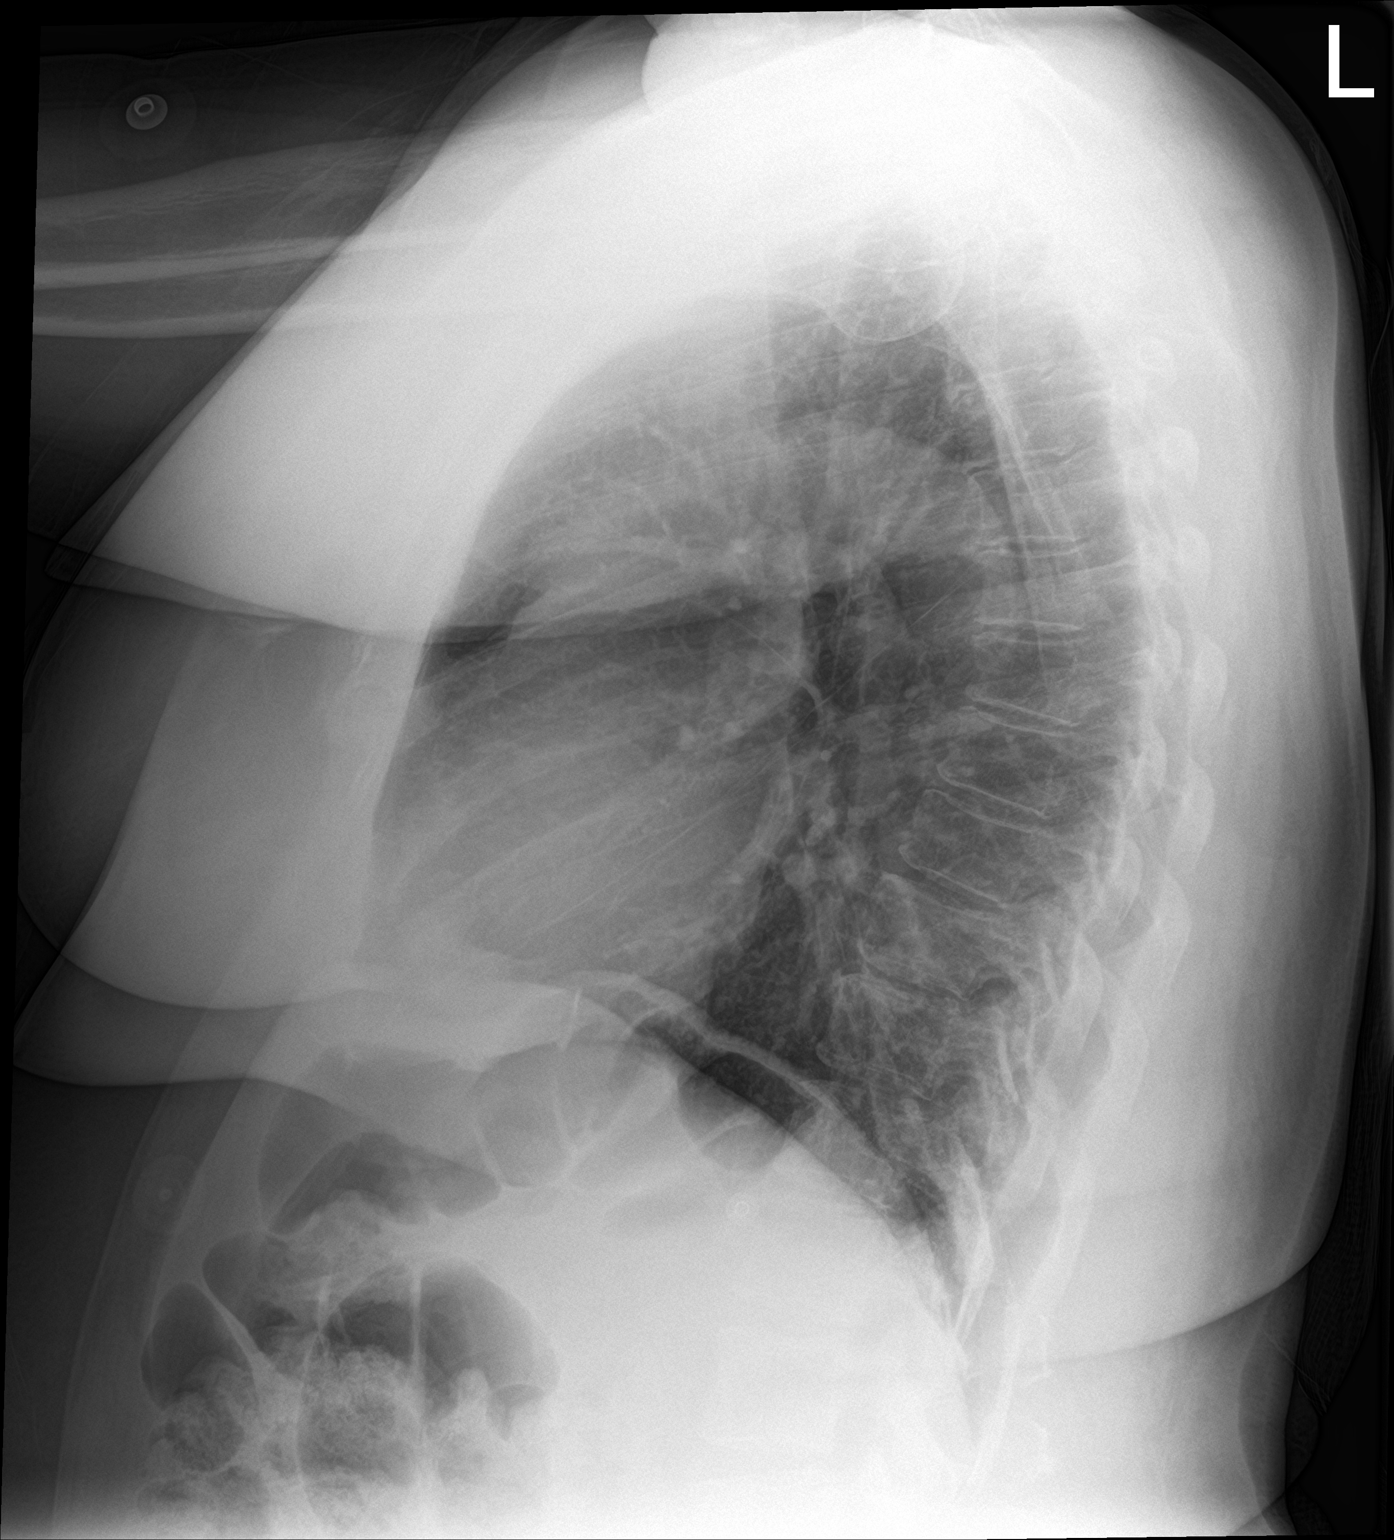

[2 of 2 positions shown; findings below may reference images not displayed]

FINDINGS: The heart size and mediastinal contours are within normal limits.
Both lungs are clear. The visualized skeletal structures are
unremarkable.
IMPRESSION: No active cardiopulmonary disease.

## 2021-07-03 ENCOUNTER — Other Ambulatory Visit: Payer: Self-pay | Admitting: Family Medicine

## 2021-08-06 ENCOUNTER — Other Ambulatory Visit: Payer: Self-pay | Admitting: Family Medicine

## 2021-08-06 NOTE — Telephone Encounter (Signed)
Is this okay to refill? 

## 2021-08-08 ENCOUNTER — Other Ambulatory Visit: Payer: Self-pay | Admitting: Family Medicine

## 2021-11-26 ENCOUNTER — Telehealth: Payer: Self-pay | Admitting: Family Medicine

## 2021-11-26 NOTE — Telephone Encounter (Signed)
[facility] - Client TELEPHONE ADVICE RECORD [facility] Patient Name: Annette Romero Gender: Female DOB: 1960-10-07 Age: 61 Y 8 D Return Phone Number: [phone] (Primary) Address: City/ State/ Zip: [place] [address] Client [facility] Day - Client Client Site [facility] - Day Provider Hannah Beat - MD Contact Type Call Who Is Calling Patient / Member / Family / Caregiver Call Type Triage / Clinical Relationship To Patient Self Return Phone Number [phone] (Primary) Chief Complaint Urination Frequency Reason for Call Symptomatic / Request for Health Information Initial Comment Caller states her urine has a fruity smell, cloudy, and increased urine output. Translation No Nurse Assessment Nurse: Daphine Deutscher, RN, Melanie Date/Time (Eastern Time): 11/26/2021 3:15:26 PM Confirm and document reason for call. If symptomatic, describe symptoms. ---Caller has cloudy urine and she also has frequency. She has noticed a fruity odor too for a month Does the patient have any new or worsening symptoms? ---Yes Will a triage be completed? ---Yes Related visit to physician within the last 2 weeks? ---No Does the PT have any chronic conditions? (i.e. diabetes, asthma, this includes High risk factors for pregnancy, etc.) ---Yes List chronic conditions. ---diabetes high blood pressure Is this a behavioral health or substance abuse call? ---No Guidelines Guideline Title Affirmed Question Affirmed Notes Nurse Date/Time Lamount Cohen Time) Urinary Symptoms Urinating more frequently than usual (i.e., frequency) Daphine Deutscher, RN, Melanie 11/26/2021 3:17:02 PM Disp. Time Lamount Cohen Time) Disposition Final User 11/26/2021 3:06:59 PM Attempt made - message left Maralyn Sago 11/26/2021 3:20:43 PM See PCP within 24 Hours Yes Daphine Deutscher RN, [place] Final Disposition 11/26/2021 3:20:43 PM See PCP within 24 Hours Yes  Daphine Deutscher, RN, Shawna Orleans PLEASE NOTE: All timestamps contained within this report are represented as [place] Standard Time. CONFIDENTIALTY NOTICE: This fax transmission is intended only for the addressee. It contains information that is legally privileged, confidential or otherwise protected from use or disclosure. If you are not the intended recipient, you are strictly prohibited from reviewing, disclosing, copying using or disseminating any of this information or taking any action in reliance on or regarding this information. If you have received this fax in error, please notify us immediately by telephone so that we can arrange for its return to [place]. Phone: [phone], Toll-Free: [phone], Fax: [phone] Page: 2 of 2 Call Id: [id number] Caller Disagree/Comply Comply Caller Understands Yes PreDisposition Call Doctor Care Advice Given Per Guideline SEE PCP WITHIN 24 HOURS: * IF OFFICE WILL BE OPEN: You need to be examined within the next 24 hours. Call your doctor (or NP/PA) when the office opens and make an appointment. DRINK PLENTY OF LIQUIDS: * Drink plenty of liquids. It is important to stay well-hydrated. CALL BACK IF: * Fever occurs CARE ADVICE given per Urinary Symptoms (Adult) guideline. Referrals Warm transfer to backlin

## 2021-11-26 NOTE — Telephone Encounter (Signed)
Patient called and stated she wants to make an appointment for UTI and diabetes follow up. Also stated that it has been going on for about a month and she has been taking medication and symptoms have been calming down. An appointment was made for 11/30/2021 at 10:40. Patient was sent to access nurse.

## 2021-11-26 NOTE — Telephone Encounter (Signed)
I spoke with pt and she wants to wait and see Dr Patsy Lager for frequency of urine, extreme thirst, cloudy urine and fruity smell to urine; pt does not ck BS at home. Pt wants to keep appt already scheduled with Dr Patsy Lager on 11/30/21. UC & ED precautions given and pt voiced understanding. Pt does not feel any symptoms of high BS. Sending note to Dr Patsy Lager who is out of office and Lupita Leash CMA.

## 2021-11-29 NOTE — Progress Notes (Deleted)
    Annette Romero T. Mahesh Sizemore, MD, CAQ Sports Medicine [facility] at [facility] [address] [place], [address]  Phone: [phone]  FAX: [phone]  Annette Romero - 61 y.o. female  MRN [id number]  Date of Birth: 09-29-1960  Date: 12/02/2021  PCP: Annette Beat, MD  Referral: Annette Beat, MD  No chief complaint on file.  Subjective:   Annette Romero is a 61 y.o. very pleasant female patient with There is no height or weight on file to calculate BMI. who presents with the following:  Diabetes Mellitus: Tolerating Medications: yes Compliance with diet: fair, There is no height or weight on file to calculate BMI. Exercise: minimal / intermittent Avg blood sugars at home: not checking Foot problems: none Hypoglycemia: none No nausea, vomitting, blurred vision, polyuria.  Lab Results  Component Value Date   HGBA1C 6.6 (A) 06/04/2021   HGBA1C 6.9 (H) 08/12/2020   HGBA1C 6.3 03/30/2019   Lab Results  Component Value Date   LDLCALC 102 (H) 08/12/2020   CREATININE 0.81 08/12/2020    Wt Readings from Last 3 Encounters:  06/04/21 265 lb 4 oz (120.3 kg)  08/18/20 266 lb 12 oz (121 kg)  05/15/20 262 lb 8 oz (119.1 kg)    HTN: Tolerating all medications without side effects Stable and at goal No CP, no sob. No HA.  BP Readings from Last 3 Encounters:  06/04/21 120/80  04/28/21 (!) 162/83  08/18/20 140/86    Basic Metabolic Panel:    Component Value Date/Time   NA 140 08/12/2020 0824   NA 142 08/31/2019 0901   K 4.0 08/12/2020 0824   CL 105 08/12/2020 0824   CO2 28 08/12/2020 0824   BUN 12 08/12/2020 0824   BUN 11 08/31/2019 0901   CREATININE 0.81 08/12/2020 0824   GLUCOSE 93 08/12/2020 0824   CALCIUM 9.3 08/12/2020 0824    Thyroid: No symptoms. Labs reviewed. Denies cold / heat intolerance, dry skin, hair loss. No goiter.  Lab Results  Component Value Date   TSH 0.64 08/12/2020     Review of Systems is noted in the HPI, as  appropriate  Objective:   There were no vitals taken for this visit.  GEN: No acute distress; alert,appropriate. PULM: Breathing comfortably in no respiratory distress PSYCH: Normally interactive.   Laboratory and Imaging Data:  Assessment and Plan:   ***

## 2021-11-29 NOTE — Progress Notes (Unsigned)
    Sibyl Mikula T. Nethan Caudillo, MD, CAQ Sports Medicine [facility] at [facility] [address] [place], [address]  Phone: [phone]  FAX: [phone]  Annette Romero - 61 y.o. female  MRN [id number]  Date of Birth: 1960/06/15  Date: 11/30/2021  PCP: Hannah Beat, MD  Referral: Hannah Beat, MD  No chief complaint on file.  Subjective:   Annette Romero is a 61 y.o. very pleasant female patient with There is no height or weight on file to calculate BMI. who presents with the following:  Patient presents with dysuria and a question of UTI.  She is also here to follow-up regarding her type 2 diabetes.    Diabetes Mellitus: Tolerating Medications: This has been stable without additional diabetic medication, diet controlled Compliance with diet: fair, There is no height or weight on file to calculate BMI. Exercise: minimal / intermittent Avg blood sugars at home: not checking Foot problems: none Hypoglycemia: none No nausea, vomitting, blurred vision, polyuria.  Lab Results  Component Value Date   HGBA1C 6.6 (A) 06/04/2021   HGBA1C 6.9 (H) 08/12/2020   HGBA1C 6.3 03/30/2019   Lab Results  Component Value Date   LDLCALC 102 (H) 08/12/2020   CREATININE 0.81 08/12/2020    Wt Readings from Last 3 Encounters:  06/04/21 265 lb 4 oz (120.3 kg)  08/18/20 266 lb 12 oz (121 kg)  05/15/20 262 lb 8 oz (119.1 kg)     Review of Systems is noted in the HPI, as appropriate  Objective:   There were no vitals taken for this visit.  GEN: No acute distress; alert,appropriate. PULM: Breathing comfortably in no respiratory distress PSYCH: Normally interactive.   Laboratory and Imaging Data:  Assessment and Plan:   ***

## 2021-11-30 ENCOUNTER — Ambulatory Visit: Payer: No Typology Code available for payment source | Admitting: Family Medicine

## 2021-11-30 ENCOUNTER — Encounter: Payer: Self-pay | Admitting: Family Medicine

## 2021-11-30 VITALS — BP 126/74 | HR 67 | Temp 98.6°F | Ht 67.5 in | Wt 263.0 lb

## 2021-11-30 DIAGNOSIS — R300 Dysuria: Secondary | ICD-10-CM | POA: Diagnosis not present

## 2021-11-30 DIAGNOSIS — E119 Type 2 diabetes mellitus without complications: Secondary | ICD-10-CM | POA: Diagnosis not present

## 2021-11-30 DIAGNOSIS — I10 Essential (primary) hypertension: Secondary | ICD-10-CM

## 2021-11-30 LAB — POC URINALSYSI DIPSTICK (AUTOMATED)
Bilirubin, UA: NEGATIVE
Glucose, UA: NEGATIVE
Ketones, UA: NEGATIVE
Nitrite, UA: NEGATIVE
Protein, UA: NEGATIVE
Spec Grav, UA: 1.015 (ref 1.010–1.025)
Urobilinogen, UA: 0.2 E.U./dL
pH, UA: 6 (ref 5.0–8.0)

## 2021-11-30 LAB — POCT GLYCOSYLATED HEMOGLOBIN (HGB A1C): Hemoglobin A1C: 6.3 % — AB (ref 4.0–5.6)

## 2021-11-30 MED ORDER — SULFAMETHOXAZOLE-TRIMETHOPRIM 800-160 MG PO TABS: 1.0000 | ORAL_TABLET | Freq: Two times a day (BID) | ORAL | 0 refills | Status: DC

## 2021-12-01 LAB — URINE CULTURE
MICRO NUMBER:: 13807702
SPECIMEN QUALITY:: ADEQUATE

## 2021-12-02 ENCOUNTER — Ambulatory Visit: Payer: BC Managed Care – PPO | Admitting: Family Medicine

## 2021-12-02 DIAGNOSIS — E0590 Thyrotoxicosis, unspecified without thyrotoxic crisis or storm: Secondary | ICD-10-CM

## 2021-12-02 DIAGNOSIS — I10 Essential (primary) hypertension: Secondary | ICD-10-CM

## 2021-12-02 DIAGNOSIS — E119 Type 2 diabetes mellitus without complications: Secondary | ICD-10-CM

## 2021-12-09 ENCOUNTER — Telehealth: Payer: Self-pay

## 2021-12-09 NOTE — Telephone Encounter (Signed)
Patient has appointment for tomorrow with Tabitha.   [facility] Day - Client TELEPHONE ADVICE RECORD [facility] Patient Name: Annette Romero Gender: Female DOB: [birth year] Age: 61 Y 1 M 1 D Return Phone Number: [phone] (Primary) Address: City/ State/ Zip: [place] [address] Client [facility] Day - Client Client Site [facility] - Day Provider Hannah Beat - MD Contact Type Call Who Is Calling Patient / Member / Family / Caregiver Call Type Triage / Clinical Relationship To Patient Self Return Phone Number [phone] (Primary) Chief Complaint Urinary Incontinence Reason for Call Symptomatic / Request for Health Information Initial Comment Caller states patient was seen in office 8/21. Caller states that UTI has not gotten any better. Caller states that she is having bladder spasms Translation No Nurse Assessment Nurse: Daphine Deutscher, RN, Melanie Date/Time (Eastern Time): 12/09/2021 2:01:44 PM Confirm and document reason for call. If symptomatic, describe symptoms. ---Caller states she was seen on 8-21 and has been taking ABX until Sunday. she is having bladder spasms again now. She also has urgency and frequency. Does the patient have any new or worsening symptoms? ---Yes Will a triage be completed? ---Yes Related visit to physician within the last 2 weeks? ---Yes Does the PT have any chronic conditions? (i.e. diabetes, asthma, this includes High risk factors for pregnancy, etc.) ---Yes List chronic conditions. ---diabetes Is this a behavioral health or substance abuse call? ---No Guidelines Guideline Title Affirmed Question Affirmed Notes Nurse Date/Time (Eastern Time) Urinary Symptoms Urinating more frequently than usual (i.e., frequency) Daphine Deutscher, RN, Melanie 12/09/2021 2:05:13 PM Disp. Time Lamount Cohen Time) Disposition Final User 12/09/2021 2:09:34 PM See PCP within 24 Hours Yes  Daphine Deutscher, RN, Shawna Orleans PLEASE NOTE: All timestamps contained within this report are represented as [place] Standard Time. CONFIDENTIALTY NOTICE: This fax transmission is intended only for the addressee. It contains information that is legally privileged, confidential or otherwise protected from use or disclosure. If you are not the intended recipient, you are strictly prohibited from reviewing, disclosing, copying using or disseminating any of this information or taking any action in reliance on or regarding this information. If you have received this fax in error, please notify us immediately by telephone so that we can arrange for its return to [place]. Phone: [phone], Toll-Free: [phone], Fax: [phone] Page: 2 of 2 Call Id: [id number] Final Disposition 12/09/2021 2:09:34 PM See PCP within 24 Hours Yes Daphine Deutscher, RN, Shawna Orleans Caller Disagree/Comply Comply Caller Understands Yes PreDisposition Call Doctor Care Advice Given Per Guideline SEE PCP WITHIN 24 HOURS: * IF OFFICE WILL BE OPEN: You need to be examined within the next 24 hours. Call your doctor (or NP/PA) when the office opens and make an appointment. CALL BACK IF: * Fever occurs * Unable to urinate and bladder feels full * You become worse DRINK PLENTY OF LIQUIDS: * Drink plenty of liquids. It is important to stay well-hydrated. * A healthy adult should drink 8 cups (240 ml) or more of liquid each day. * How can you tell if you are drinking enough liquids? The goal is to keep the urine clear or light-yellow in color. If your urine is bright yellow or dark yellow, you are probably not drinking enough liquids. CARE ADVICE given per Urinary Symptoms (Adult) guideline. Referrals REFERRED TO PCP OFFICE

## 2021-12-10 ENCOUNTER — Encounter: Payer: Self-pay | Admitting: Family

## 2021-12-10 ENCOUNTER — Ambulatory Visit: Payer: No Typology Code available for payment source | Admitting: Family

## 2021-12-10 VITALS — BP 128/70 | HR 76 | Temp 98.6°F | Resp 16 | Ht 67.5 in | Wt 265.4 lb

## 2021-12-10 DIAGNOSIS — N898 Other specified noninflammatory disorders of vagina: Secondary | ICD-10-CM | POA: Diagnosis not present

## 2021-12-10 DIAGNOSIS — Z202 Contact with and (suspected) exposure to infections with a predominantly sexual mode of transmission: Secondary | ICD-10-CM | POA: Diagnosis not present

## 2021-12-10 DIAGNOSIS — R300 Dysuria: Secondary | ICD-10-CM | POA: Diagnosis not present

## 2021-12-10 MED ORDER — METRONIDAZOLE 500 MG PO TABS: 500.0000 mg | ORAL_TABLET | Freq: Three times a day (TID) | ORAL | 0 refills | Status: AC

## 2021-12-10 NOTE — Assessment & Plan Note (Addendum)
Will repeat urine culture today to r/o uti, pending results.  Suspect may be related more to possible vaginal infection and or STI from recent sexual exposure.

## 2021-12-10 NOTE — Progress Notes (Signed)
Established Patient Office Visit  Subjective:  Patient ID: Annette Romero, female    DOB: [birth year]  Age: 61 y.o. MRN: [id number]  CC:  Chief Complaint  Patient presents with   Urinary Tract Infection    X 10 days frequency, urgency, and spasm. Was treated 10 days ago    HPI Annette Romero is here today with concerns.   Over the last one month or so with these urinary concerns.  Has discomfort and some spasming after urinating.  No vaginal or bloody discharge.  At first a three weeks ago was fruity smell, but no longer with fruity smell.   No vaginal irritation does have vaginal dryness.  She is sexually active, last active with ex husband two months ago. She did not use a condom. Not sure if another partner is involved or not.   No back pain. No change in bowel movements.  With urinary urgency, will get urge, pee a bit, but then have discomfort again and try to pee more without urinating.   Past Medical History:  Diagnosis Date   Closed right trimalleolar fracture 04/30/2016   Controlled type 2 diabetes mellitus without complication, without long-term current use of insulin (HCC) 08/18/2020   GERD (gastroesophageal reflux disease)    Hypertension    Hyperthyroidism    Tachycardia    with increased thyroid level   Vitamin D deficiency     Past Surgical History:  Procedure Laterality Date   ABDOMINAL HYSTERECTOMY     BREAST BIOPSY  1974   breast cyst   BREAST CYST ASPIRATION     BREAST EXCISIONAL BIOPSY Left    at age of 70   CESAREAN SECTION  1991   HARDWARE REMOVAL Right 04/28/2017   Procedure: HARDWARE REMOVAL-RIGHT ANKLE;  Surgeon: Kennedy Bucker, MD;  Location: [facility] ORS;  Service: Orthopedics;  Laterality: Right;   ORIF ANKLE FRACTURE Right 04/30/2016   Procedure: OPEN REDUCTION INTERNAL FIXATION (ORIF) ANKLE FRACTURE;  Surgeon: Kennedy Bucker, MD;  Location: [facility] ORS;  Service: Orthopedics;  Laterality: Right;   SVT ABLATION N/A 12/28/2019   Procedure: SVT ABLATION;   Surgeon: Marinus Maw, MD;  Location: [facility] INVASIVE CV LAB;  Service: Cardiovascular;  Laterality: N/A;   VAGINAL HYSTERECTOMY  2001   heavy bleeding due to fibroids, not cancer    Family History  Problem Relation Age of Onset   CVA Father    Heart attack Father    Heart failure Father    Seizures Mother    Drug abuse Brother        d/c from drugs   Breast cancer Neg Hx     Social History   Socioeconomic History   Marital status: Married    Spouse name: Not on file   Number of children: 2   Years of education: Not on file   Highest education level: Not on file  Occupational History   Occupation: nurse    Comment: LPN at [employer]  Tobacco Use   Smoking status: Never   Smokeless tobacco: Never  Vaping Use   Vaping Use: Never used  Substance and Sexual Activity   Alcohol use: No   Drug use: No   Sexual activity: Not on file  Other Topics Concern   Not on file  Social History Narrative   Nurse at [employer]   Separated   2 children   Son, 33, lives in [place], [address], [place]. [place]   Social Determinants of Health  Financial Resource Strain: Not on file  Food Insecurity: Not on file  Transportation Needs: Not on file  Physical Activity: Not on file  Stress: Not on file  Social Connections: Not on file  Intimate Partner Violence: Not on file    Outpatient Medications Prior to Visit  Medication Sig Dispense Refill   aspirin EC 81 MG tablet Take 81 mg by mouth daily.     GLUCOSAMINE-CHONDROITIN PO Take 2 tablets by mouth daily after supper.     irbesartan (AVAPRO) 150 MG tablet TAKE 1 TABLET BY MOUTH EVERY DAY 90 tablet 1   metoprolol tartrate (LOPRESSOR) 100 MG tablet TAKE 1 TABLET BY MOUTH TWICE A DAY 180 tablet 1   OVER THE COUNTER MEDICATION Immetri-2 tablets by mouth daily     OVER THE COUNTER MEDICATION Magnesium, Zinc & Vit D3-2 tablets by mouth two times a day     rosuvastatin (CRESTOR) 20 MG tablet Take 20 mg by mouth at bedtime.       traZODone (DESYREL) 150 MG tablet TAKE 1 TABLET (150 MG TOTAL) BY MOUTH AT BEDTIME AS NEEDED FOR SLEEP. 90 tablet 1   cyanocobalamin 1000 MCG tablet Take 1,000 mcg by mouth daily. (Patient not taking: Reported on 12/10/2021)     Ferrous Sulfate (IRON PO) Take 1 tablet by mouth daily. (Patient not taking: Reported on 12/10/2021)     meloxicam (MOBIC) 15 MG tablet TAKE 1 TABLET (15 MG TOTAL) BY MOUTH AT BEDTIME AS NEEDED FOR PAIN. (Patient not taking: Reported on 12/10/2021) 90 tablet 1   sulfamethoxazole-trimethoprim (BACTRIM DS) 800-160 MG tablet Take 1 tablet by mouth 2 (two) times daily. (Patient not taking: Reported on 12/10/2021) 14 tablet 0   SUMAtriptan (IMITREX) 50 MG tablet Take 1 tablet (50 mg total) by mouth every 2 (two) hours as needed for migraine or headache. (Patient not taking: Reported on 12/10/2021) 10 tablet 5   Turmeric POWD 1 each by Does not apply route daily. One tbsp every day (Patient not taking: Reported on 11/30/2021)     vitamin E 1000 UNIT capsule Take 2,000 Units by mouth daily.  (Patient not taking: Reported on 12/10/2021)     No facility-administered medications prior to visit.    Allergies  Allergen Reactions   Zantac [Ranitidine Hcl] Rash   Imitrex [Sumatriptan] Palpitations    Felt weird        Objective:    Physical Exam Constitutional:      General: She is not in acute distress.    Appearance: Normal appearance. She is well-developed and well-groomed. She is obese. She is not ill-appearing, toxic-appearing or diaphoretic.  HENT:     Mouth/Throat:     Pharynx: No pharyngeal swelling.     Tonsils: No tonsillar exudate.  Neck:     Thyroid: No thyroid mass.  Pulmonary:     Effort: Pulmonary effort is normal.  Abdominal:     Tenderness: There is no abdominal tenderness.  Genitourinary:    Labia:        Right: Rash (irritation erythema) present. No tenderness.        Left: Rash (irritation erythema) present. No tenderness.      Vagina: Vaginal  discharge (white thin milky discharge) present. No tenderness.  Musculoskeletal:     Lumbar back: Normal. No tenderness.     Comments: No flank pain no CVA tenderness  Lymphadenopathy:     Cervical:     Right cervical: No superficial cervical adenopathy.    Left cervical: No  superficial cervical adenopathy.  Neurological:     General: No focal deficit present.     Mental Status: She is alert and oriented to person, place, and time. Mental status is at baseline.  Psychiatric:        Mood and Affect: Mood normal.        Behavior: Behavior normal.        Thought Content: Thought content normal.        Judgment: Judgment normal.     BP 128/70   Pulse 76   Temp 98.6 F (37 C)   Resp 16   Ht 5' 7.5" (1.715 m)   Wt 265 lb 6 oz (120.4 kg)   SpO2 99%   BMI 40.95 kg/m  Wt Readings from Last 3 Encounters:  12/10/21 265 lb 6 oz (120.4 kg)  11/30/21 263 lb (119.3 kg)  06/04/21 265 lb 4 oz (120.3 kg)     Health Maintenance Due  Topic Date Due   FOOT EXAM  Never done   OPHTHALMOLOGY EXAM  Never done   Zoster Vaccines- Shingrix (1 of 2) Never done   MAMMOGRAM  06/23/2019   COVID-19 Vaccine (3 - Moderna risk series) 06/27/2019    There are no preventive care reminders to display for this patient.  Lab Results  Component Value Date   TSH 0.64 08/12/2020   Lab Results  Component Value Date   WBC 6.7 08/12/2020   HGB 12.2 08/12/2020   HCT 37.7 08/12/2020   MCV 78.2 08/12/2020   PLT 246.0 08/12/2020   Lab Results  Component Value Date   NA 140 08/12/2020   K 4.0 08/12/2020   CO2 28 08/12/2020   GLUCOSE 93 08/12/2020   BUN 12 08/12/2020   CREATININE 0.81 08/12/2020   BILITOT 0.3 08/12/2020   ALKPHOS 107 08/12/2020   AST 19 08/12/2020   ALT 13 08/12/2020   PROT 7.1 08/12/2020   ALBUMIN 4.1 08/12/2020   CALCIUM 9.3 08/12/2020   ANIONGAP 10 12/27/2019   GFR 78.16 08/12/2020   Lab Results  Component Value Date   HGBA1C 6.3 (A) 11/30/2021      Assessment &  Plan:   Problem List Items Addressed This Visit       Other   Vaginal discharge    Unclear etiology.  BV vs STI STD Panel pending.  Wet prep results pending.  Will go ahead and send in a prescription for Metronidazole 500 mg three times daily for seven days. For current subjective and objective findings.        Relevant Medications   metroNIDAZOLE (FLAGYL) 500 MG tablet   Other Relevant Orders   WET PREP BY MOLECULAR PROBE   RPR   HIV Antibody (routine testing w rflx)   Herpes Simplex Virus 1 and 2 (IgG), with Reflex to HSV-2 Inhibition   Hepatitis C Antibody   C. trachomatis/N. gonorrhoeae RNA   Dysuria - Primary    Will repeat urine culture today to r/o uti, pending results.  Suspect may be related more to possible vaginal infection and or STI from recent sexual exposure.        Relevant Orders   C. trachomatis/N. gonorrhoeae RNA   Urine Culture   Possible exposure to STD    Safe sex d/w pt .  Std panel ordered today. Pending results.   I evaluated the patient,  was consulted regarding plants for treatment of care, and agree with the assessment and plan per Modesto Charon, RN, DNP student.  Annette Rumpf, FNP-C         Relevant Orders   WET PREP BY MOLECULAR PROBE   RPR   HIV Antibody (routine testing w rflx)   Herpes Simplex Virus 1 and 2 (IgG), with Reflex to HSV-2 Inhibition   Hepatitis C Antibody   C. trachomatis/N. gonorrhoeae RNA    Meds ordered this encounter  Medications   metroNIDAZOLE (FLAGYL) 500 MG tablet    Sig: Take 1 tablet (500 mg total) by mouth 3 (three) times daily for 7 days.    Dispense:  21 tablet    Refill:  0    Order Specific Question:   Supervising Provider    Answer:   BEDSOLE, AMY E [[id number]]    Follow-up: No follow-ups on file.    Mort Sawyers, FNP

## 2021-12-10 NOTE — Assessment & Plan Note (Addendum)
Unclear etiology.  BV vs STI STD Panel pending.  Wet prep results pending.  Will go ahead and send in a prescription for Metronidazole 500 mg three times daily for seven days. For current subjective and objective findings.

## 2021-12-10 NOTE — Progress Notes (Signed)
Established Patient Office Visit  Subjective   Patient ID: Mollee Neer, female    DOB: November 02, 1960  Age: 61 y.o. MRN: [id number]  Chief Complaint  Patient presents with   Urinary Tract Infection    X 10 days frequency, urgency, and spasm. Was treated 10 days ago    Urinary Tract Infection   Verniece Encarnacion is a 61 year old pleasant female with past medical history of hypertension, GERD, hyperthyroidism, Type 2 diabetes and vitamin d deficiency, presents to the office today for worsening urinary frequency, urgency and spasm. Her symptoms started one month ago. She her a urinalysis and culture done and it was negative. She was treated with Bactrim for seven days and she finished it on Sunday.   Symptoms are spasms, urgency, feels like always has to go. She is able to void but feels that she doesn't completely empties the bladder. Denies any lower abdominal pain, back pain, fevers, chills, nausea, vomiting.   She has tried OTC Pyridium in the past, which gave her relief. Her last dose was two weeks ago. Reports her urine is cloudy but no odor. There is some sediment. She is drinking 5-6 bottle of water.   She denies any new sexual partners. Her last sexual encounter was two months ago with her ex-husband. She is unsure if there was any STD exposure. She denies any burning, itching or discharge.      Objective:     BP 128/70   Pulse 76   Temp 98.6 F (37 C)   Resp 16   Ht 5' 7.5" (1.715 m)   Wt 265 lb 6 oz (120.4 kg)   SpO2 99%   BMI 40.95 kg/m  BP Readings from Last 3 Encounters:  12/10/21 128/70  11/30/21 126/74  06/04/21 120/80      Physical Exam Vitals reviewed.  Cardiovascular:     Rate and Rhythm: Normal rate and regular rhythm.     Pulses: Normal pulses.     Heart sounds: Normal heart sounds.  Pulmonary:     Effort: Pulmonary effort is normal.     Breath sounds: Normal breath sounds.  Abdominal:     General: Bowel sounds are normal.     Palpations: Abdomen is  soft.     Tenderness: There is no abdominal tenderness. There is no right CVA tenderness or left CVA tenderness.  Genitourinary:    General: Normal vulva.     Vagina: Vaginal discharge present.  Neurological:     Mental Status: She is oriented to person, place, and time.  Psychiatric:        Mood and Affect: Mood normal.      No results found for any visits on 12/10/21.   The 10-year ASCVD risk score (Arnett DK, et al., 2019) is: 16.4%    Assessment & Plan:   Problem List Items Addressed This Visit       Other   Vaginal discharge    Unclear etiology.  BV vs STI STD Panel pending.  Wet prep results pending.  Will go ahead and send in a prescription for Metronidazole 500 mg three times daily for seven days. For current subjective and objective findings.        Relevant Medications   metroNIDAZOLE (FLAGYL) 500 MG tablet   Other Relevant Orders   WET PREP BY MOLECULAR PROBE   RPR   HIV Antibody (routine testing w rflx)   Herpes Simplex Virus 1 and 2 (IgG), with Reflex to HSV-2 Inhibition  Hepatitis C Antibody   C. trachomatis/N. gonorrhoeae RNA   Dysuria - Primary    Will repeat urine culture today to r/o uti, pending results.  Suspect may be related more to possible vaginal infection and or STI from recent sexual exposure.        Relevant Orders   C. trachomatis/N. gonorrhoeae RNA   Urine Culture   Possible exposure to STD    Safe sex d/w pt .  Std panel ordered today. Pending results.   I evaluated the patient,  was consulted regarding plants for treatment of care, and agree with the assessment and plan per Modesto Charon, RN, DNP student.   Tabitha Dugal, FNP-C         Relevant Orders   WET PREP BY MOLECULAR PROBE   RPR   HIV Antibody (routine testing w rflx)   Herpes Simplex Virus 1 and 2 (IgG), with Reflex to HSV-2 Inhibition   Hepatitis C Antibody   C. trachomatis/N. gonorrhoeae RNA    No follow-ups on file.    Modesto Charon, BSN-RN, DNP  STUDENT

## 2021-12-10 NOTE — Telephone Encounter (Signed)
Agree with precautions given to pt  Agree with nurse assessment in plan.  Thank you for speaking with them. 

## 2021-12-10 NOTE — Assessment & Plan Note (Addendum)
Safe sex d/w pt .  Std panel ordered today. Pending results.   I evaluated the patient,  was consulted regarding plants for treatment of care, and agree with the assessment and plan per Modesto Charon, RN, DNP student.   Mort Sawyers, FNP-C

## 2021-12-11 ENCOUNTER — Other Ambulatory Visit: Payer: Self-pay | Admitting: Family

## 2021-12-11 DIAGNOSIS — B3731 Acute candidiasis of vulva and vagina: Secondary | ICD-10-CM

## 2021-12-11 LAB — WET PREP BY MOLECULAR PROBE
Candida species: DETECTED — AB
MICRO NUMBER:: 13858624
SPECIMEN QUALITY:: ADEQUATE
Trichomonas vaginosis: NOT DETECTED

## 2021-12-11 LAB — C. TRACHOMATIS/N. GONORRHOEAE RNA
C. trachomatis RNA, TMA: NOT DETECTED
N. gonorrhoeae RNA, TMA: NOT DETECTED

## 2021-12-11 LAB — HIV ANTIBODY (ROUTINE TESTING W REFLEX): HIV 1&2 Ab, 4th Generation: NONREACTIVE

## 2021-12-11 LAB — HERPES SIMPLEX VIRUS 1 AND 2 (IGG),REFLEX HSV-2 INHIBITION
HAV 1 IGG,TYPE SPECIFIC AB: 36.9 index — ABNORMAL HIGH
HSV 2 IGG,TYPE SPECIFIC AB: <0.9 index

## 2021-12-11 LAB — RPR: RPR Ser Ql: NONREACTIVE

## 2021-12-11 LAB — HEPATITIS C ANTIBODY: Hepatitis C Ab: NONREACTIVE

## 2021-12-11 MED ORDER — FLUCONAZOLE 150 MG PO TABS: 150.0000 mg | ORAL_TABLET | Freq: Once | ORAL | 0 refills | Status: AC

## 2021-12-12 LAB — URINE CULTURE
MICRO NUMBER:: 13858625
SPECIMEN QUALITY:: ADEQUATE

## 2022-05-20 ENCOUNTER — Other Ambulatory Visit: Payer: Self-pay | Admitting: Family Medicine

## 2022-05-20 NOTE — Telephone Encounter (Signed)
Please schedule CPE with fasting labs prior for Dr. Copland.  

## 2022-05-20 NOTE — Telephone Encounter (Signed)
LVM for patient to callback and schedule.  

## 2022-06-01 ENCOUNTER — Other Ambulatory Visit: Payer: Self-pay | Admitting: Family Medicine

## 2022-07-12 ENCOUNTER — Telehealth: Payer: Self-pay | Admitting: Family Medicine

## 2022-07-12 NOTE — Telephone Encounter (Signed)
Received a phone call from Access Nurse requesting an appointment for pt. Pt had initially been encouraged to go to UC but declined. Pt reports that her heart rate is lower than normal and was alerted by her smart watch.  She reports that her heart rate is staying around the upper 50s.  She denies SOB, dizziness, chest pain.  She is reporting fatigue.  Scheduled an appointment with Dr. Lorelei Pont 07/15/22 @ 9:20a. She would like to know if she should continue taking her Lopressor 100mg  1 tab BID as prescribed.  Please see Access Nurse note for more details.

## 2022-07-12 NOTE — Telephone Encounter (Signed)
FYI: This call has been transferred to Access Nurse. Once the result note has been entered staff can address the message at that time.  Patient called in with the following symptoms:  Red Word: Heart rate dropped to 47. Patient stated over the past few days her hr was ranging from lower to upper 50s. Stated she has a cardiac ablation, is taking BP medication and a beta blocker.     Please advise at Mobile [phone] (mobile)  Message is routed to Provider Pool and [facility] Triage

## 2022-07-13 NOTE — Telephone Encounter (Signed)
Left message for [name] to return my call

## 2022-07-13 NOTE — Telephone Encounter (Signed)
Left detailed message for Annette Romero to cut the Metoprolol in half, so she is only taking 50 mg twice a day for now. I also ask that  check her BP and pulse and  write them down 3 times a day and bring those reading with her to appointment on  07/15/2022 with Dr. Lorelei Pont.  I ask that she call me back if she has any questions.

## 2022-07-13 NOTE — Telephone Encounter (Signed)
For now, cut the pills in half, so she is only taking 50 mg BID.    For the next few days, write down BP and pulse 3 times a day

## 2022-07-13 NOTE — Progress Notes (Unsigned)
    Annette Scholze T. Inri Sobieski, MD, [place] [facility] [place], [address]  Phone: [phone]  FAX: [phone]  Annette Romero - 62 y.o. female  MRN [id number]  Date of Birth: 11-18-1960  Date: 07/15/2022  PCP: Annette Loffler, MD  Referral: Annette Loffler, MD  No chief complaint on file.  Subjective:   Annette Romero is a 63 y.o. very pleasant female patient with There is no height or weight on file to calculate BMI. who presents with the following:  Patient presents with an acute low heart rate.  She called in this week with a pulse in the 50s and was symptomatic.  I had her drop her metoprolol dosing to 50 mg p.o. twice daily, she is here today in follow-up regarding this.    Review of Systems is noted in the HPI, as appropriate  Objective:   There were no vitals taken for this visit.  GEN: No acute distress; alert,appropriate. PULM: Breathing comfortably in no respiratory distress PSYCH: Normally interactive.   Laboratory and Imaging Data:  Assessment and Plan:   ***

## 2022-07-14 NOTE — Telephone Encounter (Signed)
Patient returned Annette Romero's call during lunch, read previous note to her word for word, she expressed her understanding.

## 2022-07-15 ENCOUNTER — Encounter: Payer: Self-pay | Admitting: Family Medicine

## 2022-07-15 ENCOUNTER — Ambulatory Visit: Payer: No Typology Code available for payment source | Admitting: Family Medicine

## 2022-07-15 VITALS — BP 120/86 | HR 75 | Temp 97.6°F | Ht 67.5 in | Wt 261.4 lb

## 2022-07-15 DIAGNOSIS — I10 Essential (primary) hypertension: Secondary | ICD-10-CM

## 2022-07-15 DIAGNOSIS — E785 Hyperlipidemia, unspecified: Secondary | ICD-10-CM

## 2022-07-15 DIAGNOSIS — E119 Type 2 diabetes mellitus without complications: Secondary | ICD-10-CM | POA: Diagnosis not present

## 2022-07-15 DIAGNOSIS — E1169 Type 2 diabetes mellitus with other specified complication: Secondary | ICD-10-CM | POA: Diagnosis not present

## 2022-07-15 DIAGNOSIS — E559 Vitamin D deficiency, unspecified: Secondary | ICD-10-CM

## 2022-07-15 DIAGNOSIS — R001 Bradycardia, unspecified: Secondary | ICD-10-CM | POA: Diagnosis not present

## 2022-07-15 DIAGNOSIS — D508 Other iron deficiency anemias: Secondary | ICD-10-CM

## 2022-07-15 LAB — LIPID PANEL
Cholesterol: 180 mg/dL (ref 0–200)
HDL: 46.7 mg/dL (ref >39.00)
LDL Cholesterol: 108 mg/dL — ABNORMAL HIGH (ref 0–99)
NonHDL: 133.56
Total CHOL/HDL Ratio: 4
Triglycerides: 127 mg/dL (ref 0.0–149.0)
VLDL: 25.4 mg/dL (ref 0.0–40.0)

## 2022-07-15 LAB — IBC + FERRITIN
Ferritin: 109.3 ng/mL (ref 10.0–291.0)
Iron: 64 ug/dL (ref 42–145)
Saturation Ratios: 25.3 % (ref 20.0–50.0)
TIBC: 253.4 ug/dL (ref 250.0–450.0)
Transferrin: 181 mg/dL — ABNORMAL LOW (ref 212.0–360.0)

## 2022-07-15 LAB — CBC WITH DIFFERENTIAL/PLATELET
Basophils Absolute: 0 10*3/uL (ref 0.0–0.1)
Basophils Relative: 0.5 % (ref 0.0–3.0)
Eosinophils Absolute: 0.1 10*3/uL (ref 0.0–0.7)
Eosinophils Relative: 1.8 % (ref 0.0–5.0)
HCT: 37.4 % (ref 36.0–46.0)
Hemoglobin: 12.2 g/dL (ref 12.0–15.0)
Lymphocytes Relative: 47.9 % — ABNORMAL HIGH (ref 12.0–46.0)
Lymphs Abs: 2.8 10*3/uL (ref 0.7–4.0)
MCHC: 32.7 g/dL (ref 30.0–36.0)
MCV: 78.4 fl (ref 78.0–100.0)
Monocytes Absolute: 0.4 10*3/uL (ref 0.1–1.0)
Monocytes Relative: 6.3 % (ref 3.0–12.0)
Neutro Abs: 2.6 10*3/uL (ref 1.4–7.7)
Neutrophils Relative %: 43.5 % (ref 43.0–77.0)
Platelets: 250 10*3/uL (ref 150.0–400.0)
RBC: 4.77 Mil/uL (ref 3.87–5.11)
RDW: 14.5 % (ref 11.5–15.5)
WBC: 5.9 10*3/uL (ref 4.0–10.5)

## 2022-07-15 LAB — HEMOGLOBIN A1C: Hgb A1c MFr Bld: 6.8 % — ABNORMAL HIGH (ref 4.6–6.5)

## 2022-07-15 LAB — VITAMIN D 25 HYDROXY (VIT D DEFICIENCY, FRACTURES): VITD: 27.05 ng/mL — ABNORMAL LOW (ref 30.00–100.00)

## 2022-07-15 LAB — MICROALBUMIN / CREATININE URINE RATIO
Creatinine,U: 151.4 mg/dL
Microalb Creat Ratio: 0.5 mg/g (ref 0.0–30.0)
Microalb, Ur: 0.7 mg/dL (ref 0.0–1.9)

## 2022-07-15 MED ORDER — OZEMPIC (0.25 OR 0.5 MG/DOSE) 2 MG/3ML ~~LOC~~ SOPN: PEN_INJECTOR | SUBCUTANEOUS | 5 refills | Status: DC

## 2022-07-15 MED ORDER — METOPROLOL TARTRATE 50 MG PO TABS: 50.0000 mg | ORAL_TABLET | Freq: Two times a day (BID) | ORAL | 3 refills | Status: DC

## 2022-07-27 ENCOUNTER — Encounter: Payer: Self-pay | Admitting: Family Medicine

## 2022-07-27 ENCOUNTER — Telehealth: Payer: Self-pay | Admitting: Family Medicine

## 2022-07-27 NOTE — Telephone Encounter (Signed)
Patient contacted the office regarding prior authorization needed for Ozempic. Stated [employer] faxed a form to our office, I was unsuccessful in finding this form. Patient needs prior authorization in order for insurance company to cover Ozempic. If needed, please advise [phone]. Thank you.

## 2022-07-27 NOTE — Telephone Encounter (Signed)
error 

## 2022-07-29 ENCOUNTER — Other Ambulatory Visit (HOSPITAL_COMMUNITY): Payer: Self-pay

## 2022-07-29 ENCOUNTER — Telehealth: Payer: Self-pay

## 2022-07-29 NOTE — Telephone Encounter (Signed)
Annette Romero notified by telephone that the PA for her Ozempic was approved.

## 2022-07-29 NOTE — Telephone Encounter (Signed)
Patient Advocate Encounter  Prior authorization for Ozempic (0.25 or 0.5 MG/DOSE) /3ML pen-injectors submitted and APPROVED.  Key [id number] Effective: 07/29/22 - 07/29/23

## 2022-07-29 NOTE — Telephone Encounter (Signed)
Prior authorization for Ozempic (0.25 or 0.5 MG/DOSE) /3ML pen-injectors submitted and APPROVED.  Effective: 07/29/22 - 07/29/23

## 2022-08-06 ENCOUNTER — Encounter: Payer: Self-pay | Admitting: Family Medicine

## 2022-08-06 ENCOUNTER — Encounter: Payer: Self-pay | Admitting: Primary Care

## 2022-08-06 ENCOUNTER — Ambulatory Visit: Payer: No Typology Code available for payment source | Admitting: Primary Care

## 2022-08-06 VITALS — BP 126/80 | HR 95 | Temp 97.3°F | Ht 67.5 in | Wt 254.0 lb

## 2022-08-06 DIAGNOSIS — R051 Acute cough: Secondary | ICD-10-CM | POA: Diagnosis not present

## 2022-08-06 LAB — POC COVID19 BINAXNOW: SARS Coronavirus 2 Ag: NEGATIVE

## 2022-08-06 MED ORDER — FLUTICASONE PROPIONATE 50 MCG/ACT NA SUSP
1.0000 | Freq: Two times a day (BID) | NASAL | 0 refills | Status: DC
Start: 2022-08-06 — End: 2022-08-29

## 2022-08-06 MED ORDER — BENZONATATE 200 MG PO CAPS
200.0000 mg | ORAL_CAPSULE | Freq: Three times a day (TID) | ORAL | 0 refills | Status: DC | PRN
Start: 2022-08-06 — End: 2023-03-09

## 2022-08-06 NOTE — Patient Instructions (Signed)
Nasal Congestion/Ear Pressure/Sinus Pressure: Try using Flonase (fluticasone) nasal spray. Instill 1 spray in each nostril twice daily.   You may take Benzonatate capsules for cough. Take 1 capsule by mouth three times daily as needed for cough.  Please call us Monday if your symptoms become worse.  It was a pleasure meeting you!

## 2022-08-06 NOTE — Assessment & Plan Note (Signed)
Exam reassuring and she's feeling better. Likely viral etiology. Rapid Covid-19 test negative today.  Treat with conservative care.  Start Benzonatate capsules for cough. Take 1 capsule by mouth three times daily as needed for cough. Nasal Congestion/Ear Pressure/Sinus Pressure: Try using Flonase (fluticasone) nasal spray. Instill 1 spray in each nostril twice daily.   Return precautions provided.

## 2022-08-06 NOTE — Progress Notes (Signed)
Subjective:    Patient ID: Annette Romero, female    DOB: [birth year], 62 y.o.   MRN: [id number]  Cough Associated symptoms include chills, headaches, myalgias and rhinorrhea. Pertinent negatives include no fever.    Annette Romero is a very pleasant 62 y.o. female patient of Dr. Patsy Lager with a history of hypertension, hyperthyroidism, type 2 diabetes, GERD who presents today to discuss cough.  Symptom onset five days ago with sore throat and cough. She then developed chills, nights sweats, rhinorrhea, sneezing, fatigue, loss of voice. Her voice is now returning.   She denies fevers, body aches. She tested negative for Covid-19 at symptom onset.  Her most bothersome symptom is cough, worse at night. 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   She's taken Theraflu, Coricidin with some improvement. She's feeling better today.      Review of Systems  Constitutional:  Positive for chills and fatigue. Negative for fever.  HENT:  Positive for congestion and rhinorrhea.   Respiratory:  Positive for cough.   Musculoskeletal:  Positive for myalgias.  Neurological:  Positive for headaches.         Past Medical History:  Diagnosis Date   Closed right trimalleolar fracture 04/30/2016   Controlled type 2 diabetes mellitus without complication, without long-term current use of insulin (HCC) 08/18/2020   GERD (gastroesophageal reflux disease)    Hypertension    Hyperthyroidism    Tachycardia    with increased thyroid level   Vitamin D deficiency     Social History   Socioeconomic History   Marital status: Divorced    Spouse name: Not on file   Number of children: 2   Years of education: Not on file   Highest education level: Not on file  Occupational History   Occupation: nurse    Comment: LPN at [employer]  Tobacco Use   Smoking status: Never   Smokeless tobacco:  Never  Vaping Use   Vaping Use: Never used  Substance and Sexual Activity   Alcohol use: No   Drug use: No   Sexual activity: Not on file  Other Topics Concern   Not on file  Social History Narrative   Nurse at [employer]   Separated   2 children   Son, 33, lives in [place], [address], [place]. [place]   Social Determinants of Health   Financial Resource Strain: Not on file  Food Insecurity: Not on file  Transportation Needs: Not on file  Physical Activity: Not on file  Stress: Not on file  Social Connections: Not on file  Intimate Partner Violence: Not on file    Past Surgical History:  Procedure Laterality Date   ABDOMINAL HYSTERECTOMY     BREAST BIOPSY  1974   breast cyst   BREAST CYST ASPIRATION     BREAST EXCISIONAL BIOPSY Left    at age of 58   CESAREAN SECTION  1991   HARDWARE REMOVAL Right 04/28/2017   Procedure: HARDWARE REMOVAL-RIGHT ANKLE;  Surgeon: Kennedy Bucker, MD;  Location: [facility] ORS;  Service: Orthopedics;  Laterality: Right;   ORIF ANKLE FRACTURE Right 04/30/2016   Procedure: OPEN REDUCTION INTERNAL FIXATION (ORIF) ANKLE FRACTURE;  Surgeon: Kennedy Bucker, MD;  Location: [facility] OR[facility];  Service: Orthopedics;  Laterality: Right;   SVT ABLATION N/A 12/28/2019   Procedure: SVT ABLATION;  Surgeon: Marinus Maw,  MD;  Location: [facility];  Service: Cardiovascular;  Laterality: N/A;   VAGINAL HYSTERECTOMY  2001   heavy bleeding due to fibroids, not cancer    Family History  Problem Relation Age of Onset   CVA Father    Heart attack Father    Heart failure Father    Seizures Mother    Drug abuse Brother        d/c from drugs   Breast cancer Neg Hx     Allergies  Allergen Reactions   Zantac [Ranitidine Hcl] Rash   Imitrex [Sumatriptan] Palpitations    Felt weird    Current Outpatient Medications on File Prior to Visit  Medication Sig Dispense Refill   aspirin EC 81 MG tablet Take 81 mg by mouth daily.     Cholecalciferol (VITAMIN D3) 50  MCG (2000 UT) capsule Take 4,000 Units by mouth daily.     GLUCOSAMINE-CHONDROITIN PO Take 2 tablets by mouth daily after supper.     irbesartan (AVAPRO) 150 MG tablet TAKE 1 TABLET BY MOUTH EVERY DAY 90 tablet 0   metoprolol tartrate (LOPRESSOR) 50 MG tablet Take 1 tablet (50 mg total) by mouth 2 (two) times daily. 180 tablet 3   OVER THE COUNTER MEDICATION Immetri-2 tablets by mouth daily     OVER THE COUNTER MEDICATION Magnesium, Zinc & Vit D3-2 tablets by mouth two times a day     rosuvastatin (CRESTOR) 20 MG tablet Take 20 mg by mouth at bedtime.      Semaglutide,0.25 or 0.5MG /DOS, (OZEMPIC, 0.25 OR 0.5 MG/DOSE,) 2 MG/3ML SOPN 0.25 mg once a week injection for 4 weeks, then increase to 0.5 mg once a week injection 3 mL 5   traZODone (DESYREL) 150 MG tablet TAKE 1 TABLET (150 MG TOTAL) BY MOUTH AT BEDTIME AS NEEDED FOR SLEEP. 90 tablet 0   No current facility-administered medications on file prior to visit.    BP 126/80   Pulse 95   Temp (!) 97.3 F (36.3 C) (Temporal)   Ht 5' 7.5" (1.715 m)   Wt 254 lb (115.2 kg)   SpO2 99%   BMI 39.19 kg/m  Objective:   Physical Exam Constitutional:      Appearance: She is ill-appearing.  HENT:     Right Ear: Tympanic membrane and ear canal normal.     Left Ear: Tympanic membrane and ear canal normal.     Nose:     Right Sinus: No maxillary sinus tenderness or frontal sinus tenderness.     Left Sinus: No maxillary sinus tenderness or frontal sinus tenderness.     Mouth/Throat:     Pharynx: No posterior oropharyngeal erythema.  Eyes:     Conjunctiva/sclera: Conjunctivae normal.  Cardiovascular:     Rate and Rhythm: Normal rate and regular rhythm.  Pulmonary:     Effort: Pulmonary effort is normal.     Breath sounds: Normal breath sounds. No wheezing or rales.     Comments: Congested cough noted several times during visit Musculoskeletal:     Cervical back: Neck supple.  Lymphadenopathy:     Cervical: No cervical adenopathy.  Skin:     General: Skin is warm and dry.           Assessment & Plan:  Acute cough Assessment & Plan: Exam reassuring and she's feeling better. Likely viral etiology. Rapid Covid-19 test negative today.  Treat with conservative care.  Start Benzonatate capsules for cough. Take 1 capsule by mouth three times  daily as needed for cough. Nasal Congestion/Ear Pressure/Sinus Pressure: Try using Flonase (fluticasone) nasal spray. Instill 1 spray in each nostril twice daily.   Return precautions provided.   Orders: -     POC COVID-19 BinaxNow -     Benzonatate; Take 1 capsule (200 mg total) by mouth 3 (three) times daily as needed for cough.  Dispense: 15 capsule; Refill: 0 -     Fluticasone Propionate; Place 1 spray into both nostrils 2 (two) times daily.  Dispense: 16 g; Refill: 0        Doreene Nest, NP

## 2022-08-20 ENCOUNTER — Other Ambulatory Visit: Payer: Self-pay | Admitting: Family Medicine

## 2022-08-26 ENCOUNTER — Other Ambulatory Visit: Payer: Self-pay | Admitting: Family Medicine

## 2022-08-28 ENCOUNTER — Other Ambulatory Visit: Payer: Self-pay | Admitting: Primary Care

## 2022-08-28 DIAGNOSIS — R051 Acute cough: Secondary | ICD-10-CM

## 2022-11-16 ENCOUNTER — Encounter: Payer: Self-pay | Admitting: Family Medicine

## 2022-11-17 MED ORDER — SEMAGLUTIDE (2 MG/DOSE) 8 MG/3ML ~~LOC~~ SOPN: 2.0000 mg | PEN_INJECTOR | SUBCUTANEOUS | 5 refills | Status: DC

## 2022-11-17 MED ORDER — SEMAGLUTIDE (1 MG/DOSE) 4 MG/3ML ~~LOC~~ SOPN: 1.0000 mg | PEN_INJECTOR | SUBCUTANEOUS | 0 refills | Status: DC

## 2022-12-06 ENCOUNTER — Other Ambulatory Visit: Payer: Self-pay | Admitting: Family Medicine

## 2022-12-06 NOTE — Telephone Encounter (Signed)
Please schedule CPE with fasting labs prior with Dr. Copland.  

## 2022-12-06 NOTE — Telephone Encounter (Signed)
LVM for patient to c/b and sched.  

## 2023-02-16 ENCOUNTER — Telehealth: Payer: Self-pay | Admitting: *Deleted

## 2023-02-16 DIAGNOSIS — E559 Vitamin D deficiency, unspecified: Secondary | ICD-10-CM

## 2023-02-16 DIAGNOSIS — D508 Other iron deficiency anemias: Secondary | ICD-10-CM

## 2023-02-16 DIAGNOSIS — E1169 Type 2 diabetes mellitus with other specified complication: Secondary | ICD-10-CM

## 2023-02-16 DIAGNOSIS — E119 Type 2 diabetes mellitus without complications: Secondary | ICD-10-CM

## 2023-02-16 NOTE — Telephone Encounter (Signed)
-----   Message from Alvina Chou sent at 02/15/2023 11:26 AM EST ----- Regarding: Lab orders for Wed, 11.20.24 Patient is scheduled for CPX labs, please order future labs, Thanks , Camelia Eng

## 2023-03-02 ENCOUNTER — Other Ambulatory Visit: Payer: No Typology Code available for payment source

## 2023-03-02 DIAGNOSIS — D508 Other iron deficiency anemias: Secondary | ICD-10-CM | POA: Diagnosis not present

## 2023-03-02 DIAGNOSIS — Z7985 Long-term (current) use of injectable non-insulin antidiabetic drugs: Secondary | ICD-10-CM

## 2023-03-02 DIAGNOSIS — E785 Hyperlipidemia, unspecified: Secondary | ICD-10-CM

## 2023-03-02 DIAGNOSIS — E559 Vitamin D deficiency, unspecified: Secondary | ICD-10-CM | POA: Diagnosis not present

## 2023-03-02 DIAGNOSIS — E1169 Type 2 diabetes mellitus with other specified complication: Secondary | ICD-10-CM | POA: Diagnosis not present

## 2023-03-02 DIAGNOSIS — E119 Type 2 diabetes mellitus without complications: Secondary | ICD-10-CM

## 2023-03-02 LAB — CBC WITH DIFFERENTIAL/PLATELET
Basophils Absolute: 0 10*3/uL (ref 0.0–0.1)
Basophils Relative: 0.6 % (ref 0.0–3.0)
Eosinophils Absolute: 0.1 10*3/uL (ref 0.0–0.7)
Eosinophils Relative: 1.6 % (ref 0.0–5.0)
HCT: 37.6 % (ref 36.0–46.0)
Hemoglobin: 12.2 g/dL (ref 12.0–15.0)
Lymphocytes Relative: 51.6 % — ABNORMAL HIGH (ref 12.0–46.0)
Lymphs Abs: 3.3 10*3/uL (ref 0.7–4.0)
MCHC: 32.5 g/dL (ref 30.0–36.0)
MCV: 79 fL (ref 78.0–100.0)
Monocytes Absolute: 0.4 10*3/uL (ref 0.1–1.0)
Monocytes Relative: 6.3 % (ref 3.0–12.0)
Neutro Abs: 2.5 10*3/uL (ref 1.4–7.7)
Neutrophils Relative %: 39.9 % — ABNORMAL LOW (ref 43.0–77.0)
Platelets: 278 10*3/uL (ref 150.0–400.0)
RBC: 4.76 Mil/uL (ref 3.87–5.11)
RDW: 14.4 % (ref 11.5–15.5)
WBC: 6.3 10*3/uL (ref 4.0–10.5)

## 2023-03-02 LAB — HEPATIC FUNCTION PANEL
ALT: 8 U/L (ref 0–35)
AST: 16 U/L (ref 0–37)
Albumin: 3.7 g/dL (ref 3.5–5.2)
Alkaline Phosphatase: 94 U/L (ref 39–117)
Bilirubin, Direct: 0.1 mg/dL (ref 0.0–0.3)
Total Bilirubin: 0.4 mg/dL (ref 0.2–1.2)
Total Protein: 6.5 g/dL (ref 6.0–8.3)

## 2023-03-02 LAB — LIPID PANEL
Cholesterol: 184 mg/dL (ref 0–200)
HDL: 39.6 mg/dL (ref >39.00)
LDL Cholesterol: 126 mg/dL — ABNORMAL HIGH (ref 0–99)
NonHDL: 143.91
Total CHOL/HDL Ratio: 5
Triglycerides: 89 mg/dL (ref 0.0–149.0)
VLDL: 17.8 mg/dL (ref 0.0–40.0)

## 2023-03-02 LAB — BASIC METABOLIC PANEL
BUN: 7 mg/dL (ref 6–23)
CO2: 26 meq/L (ref 19–32)
Calcium: 9 mg/dL (ref 8.4–10.5)
Chloride: 106 meq/L (ref 96–112)
Creatinine, Ser: 0.75 mg/dL (ref 0.40–1.20)
GFR: 84.2 mL/min (ref >60.00)
Glucose, Bld: 104 mg/dL — ABNORMAL HIGH (ref 70–99)
Potassium: 3.4 meq/L — ABNORMAL LOW (ref 3.5–5.1)
Sodium: 141 meq/L (ref 135–145)

## 2023-03-02 LAB — IBC + FERRITIN
Ferritin: 140.5 ng/mL (ref 10.0–291.0)
Iron: 65 ug/dL (ref 42–145)
Saturation Ratios: 27.5 % (ref 20.0–50.0)
TIBC: 236.6 ug/dL — ABNORMAL LOW (ref 250.0–450.0)
Transferrin: 169 mg/dL — ABNORMAL LOW (ref 212.0–360.0)

## 2023-03-02 LAB — VITAMIN D 25 HYDROXY (VIT D DEFICIENCY, FRACTURES): VITD: 53.76 ng/mL (ref 30.00–100.00)

## 2023-03-02 LAB — HEMOGLOBIN A1C: Hgb A1c MFr Bld: 5.9 % (ref 4.6–6.5)

## 2023-03-02 NOTE — Addendum Note (Signed)
Addended by: Alvina Chou on: 03/02/2023 08:13 AM   Modules accepted: Orders

## 2023-03-03 LAB — MICROALBUMIN / CREATININE URINE RATIO
Creatinine,U: 147.8 mg/dL
Microalb Creat Ratio: 0.5 mg/g (ref 0.0–30.0)
Microalb, Ur: <0.7 mg/dL (ref 0.0–1.9)

## 2023-03-08 ENCOUNTER — Encounter: Payer: Self-pay | Admitting: Family Medicine

## 2023-03-08 NOTE — Progress Notes (Unsigned)
Annette Muriel T. Khamil Lamica, MD, CAQ Sports Medicine [facility] at [facility] [address] [place], [address]  Phone: [phone]  FAX: [phone]  Annette Romero - 62 y.o. female  MRN [id number]  Date of Birth: 11/27/1960  Date: 03/09/2023  PCP: Hannah Beat, MD  Referral: Hannah Beat, MD  No chief complaint on file.  Patient Care Team: Hannah Beat, MD as PCP - General (Family Medicine) Debbe Odea, MD as PCP - Cardiology (Cardiology) Laurena Slimmer, MD (Inactive) as Consulting Physician (Endocrinology) Subjective:   Annette Romero is a 62 y.o. pleasant patient who presents with the following:  Health Maintenance Summary Reviewed and updated, unless pt declines services.  Tobacco History Reviewed. Non-smoker Alcohol: No concerns, no excessive use Exercise Habits: Some activity, rec at least 30 mins 5 times a week STD concerns: none Drug Use: None Lumps or breast concerns: no  Foot exam Eye exam Shingrix Mammo Covid booster Flu  History of hysterectomy  Diabetes Mellitus: Tolerating Medications: yes -she is now on Ozempic 2 mg Compliance with diet: fair, There is no height or weight on file to calculate BMI. Exercise: minimal / intermittent Avg blood sugars at home: not checking Foot problems: none Hypoglycemia: none No nausea, vomitting, blurred vision, polyuria.  Lab Results  Component Value Date   HGBA1C 5.9 03/02/2023   HGBA1C 6.8 (H) 07/15/2022   HGBA1C 6.3 (A) 11/30/2021   Lab Results  Component Value Date   MICROALBUR <0.7 03/03/2023   LDLCALC 126 (H) 03/02/2023   CREATININE 0.75 03/02/2023    Wt Readings from Last 3 Encounters:  08/06/22 254 lb (115.2 kg)  07/15/22 261 lb 6 oz (118.6 kg)  12/10/21 265 lb 6 oz (120.4 kg)     Health Maintenance  Topic Date Due   FOOT EXAM  Never done   OPHTHALMOLOGY EXAM  Never done   Zoster Vaccines- Shingrix (1 of 2) Never done   MAMMOGRAM  06/23/2019    COVID-19 Vaccine (3 - Moderna risk series) 06/27/2019   INFLUENZA VACCINE  11/11/2022   HEMOGLOBIN A1C  08/30/2023   Diabetic kidney evaluation - eGFR measurement  03/01/2024   Diabetic kidney evaluation - Urine ACR  03/02/2024   DTaP/Tdap/Td (2 - Td or Tdap) 01/26/2025   Colonoscopy  06/10/2027   Hepatitis C Screening  Completed   HIV Screening  Completed   HPV VACCINES  Aged Out    Immunization History  Administered Date(s) Administered   Influenza,inj,Quad PF,6+ Mos 01/27/2015   Influenza-Unspecified 01/20/2016, 01/10/2018   Moderna Sars-Covid-2 Vaccination 05/02/2019, 05/30/2019   Tdap 01/27/2015   Patient Active Problem List   Diagnosis Date Noted   Controlled type 2 diabetes mellitus without complication, without long-term current use of insulin (HCC) 08/18/2020    Priority: High   Hyperthyroidism     Priority: Medium    Essential hypertension     Priority: Medium    Vitamin D deficiency     Priority: Low   GERD (gastroesophageal reflux disease)     Priority: Low   Paroxysmal SVT (supraventricular tachycardia) (HCC) 10/11/2013    Past Medical History:  Diagnosis Date   Closed right trimalleolar fracture 04/30/2016   Controlled type 2 diabetes mellitus without complication, without long-term current use of insulin (HCC) 08/18/2020   GERD (gastroesophageal reflux disease)    Hypertension    Hyperthyroidism    Tachycardia    with increased thyroid level   Vitamin D deficiency     Past Surgical History:  Procedure Laterality Date   ABDOMINAL HYSTERECTOMY     BREAST BIOPSY  1974   breast cyst   BREAST CYST ASPIRATION     BREAST EXCISIONAL BIOPSY Left    at age of 63   CESAREAN SECTION  1991   HARDWARE REMOVAL Right 04/28/2017   Procedure: HARDWARE REMOVAL-RIGHT ANKLE;  Surgeon: Kennedy Bucker, MD;  Location: [facility] ORS;  Service: Orthopedics;  Laterality: Right;   ORIF ANKLE FRACTURE Right 04/30/2016   Procedure: OPEN REDUCTION INTERNAL FIXATION (ORIF) ANKLE  FRACTURE;  Surgeon: Kennedy Bucker, MD;  Location: [facility] OR[facility];  Service: Orthopedics;  Laterality: Right;   SVT ABLATION N/A 12/28/2019   Procedure: SVT ABLATION;  Surgeon: Marinus Maw, MD;  Location: [facility] INVASIVE CV LAB;  Service: Cardiovascular;  Laterality: N/A;   VAGINAL HYSTERECTOMY  2001   heavy bleeding due to fibroids, not cancer    Family History  Problem Relation Age of Onset   CVA Father    Heart attack Father    Heart failure Father    Seizures Mother    Drug abuse Brother        d/c from drugs   Breast cancer Neg Hx     Social History   Social History Narrative   Engineer, civil (consulting) at [employer]   2 children   Son, 33, lives in [place], [address], [place]. Annette Romero    Past Medical History, Surgical History, Social History, Family History, Problem List, Medications, and Allergies have been reviewed and updated if relevant.  Review of Systems: Pertinent positives are listed above.  Otherwise, a full 14 point review of systems has been done in full and it is negative except where it is noted positive.  Objective:   There were no vitals taken for this visit. Ideal Body Weight:   No results found.    07/15/2022    9:40 AM 08/18/2020   10:49 AM  Depression screen PHQ 2/9  Decreased Interest 0 0  Down, Depressed, Hopeless 0 0  PHQ - 2 Score 0 0     GEN: well developed, well nourished, no acute distress Eyes: conjunctiva and lids normal, PERRLA, EOMI ENT: TM clear, nares clear, oral exam WNL Neck: supple, no lymphadenopathy, no thyromegaly, no JVD Pulm: clear to auscultation and percussion, respiratory effort normal CV: regular rate and rhythm, S1-S2, no murmur, rub or gallop, no bruits Chest: no scars, masses, no lumps BREAST: breast exam declined GI: soft, non-tender; no hepatosplenomegaly, masses; active bowel sounds all quadrants GU: GU exam declined Lymph: no cervical, axillary or inguinal adenopathy MSK: gait normal, muscle tone and strength WNL, no  joint swelling, effusions, discoloration, crepitus  SKIN: clear, good turgor, color WNL, no rashes, lesions, or ulcerations Neuro: normal mental status, normal strength, sensation, and motion Psych: alert; oriented to person, place and time, normally interactive and not anxious or depressed in appearance.   All labs reviewed with patient. Results for orders placed or performed in visit on 03/02/23  VITAMIN D 25 Hydroxy (Vit-D Deficiency, Fractures)  Result Value Ref Range   VITD 53.76 30.00 - 100.00 ng/mL  IBC + Ferritin  Result Value Ref Range   Iron 65 42 - 145 ug/dL   Transferrin 841.3 (L) 212.0 - 360.0 mg/dL   Saturation Ratios 24.4 20.0 - 50.0 %   Ferritin 140.5 10.0 - 291.0 ng/mL   TIBC 236.6 (L) 250.0 - 450.0 mcg/dL  Hepatic Function Panel  Result Value Ref Range   Total Bilirubin 0.4  0.2 - 1.2 mg/dL   Bilirubin, Direct 0.1 0.0 - 0.3 mg/dL   Alkaline Phosphatase 94 39 - 117 U/L   AST 16 0 - 37 U/L   ALT 8 0 - 35 U/L   Total Protein 6.5 6.0 - 8.3 g/dL   Albumin 3.7 3.5 - 5.2 g/dL  Basic metabolic panel  Result Value Ref Range   Sodium 141 135 - 145 mEq/L   Potassium 3.4 (L) 3.5 - 5.1 mEq/L   Chloride 106 96 - 112 mEq/L   CO2 26 19 - 32 mEq/L   Glucose, Bld 104 (H) 70 - 99 mg/dL   BUN 7 6 - 23 mg/dL   Creatinine, Ser 6.21 0.40 - 1.20 mg/dL   GFR 30.86 >57.84 mL/min   Calcium 9.0 8.4 - 10.5 mg/dL  CBC with Differential/Platelet  Result Value Ref Range   WBC 6.3 4.0 - 10.5 K/uL   RBC 4.76 3.87 - 5.11 Mil/uL   Hemoglobin 12.2 12.0 - 15.0 g/dL   HCT 69.6 29.5 - 28.4 %   MCV 79.0 78.0 - 100.0 fl   MCHC 32.5 30.0 - 36.0 g/dL   RDW 13.2 44.0 - 10.2 %   Platelets 278.0 150.0 - 400.0 K/uL   Neutrophils Relative % 39.9 (L) 43.0 - 77.0 %   Lymphocytes Relative 51.6 (H) 12.0 - 46.0 %   Monocytes Relative 6.3 3.0 - 12.0 %   Eosinophils Relative 1.6 0.0 - 5.0 %   Basophils Relative 0.6 0.0 - 3.0 %   Neutro Abs 2.5 1.4 - 7.7 K/uL   Lymphs Abs 3.3 0.7 - 4.0 K/uL    Monocytes Absolute 0.4 0.1 - 1.0 K/uL   Eosinophils Absolute 0.1 0.0 - 0.7 K/uL   Basophils Absolute 0.0 0.0 - 0.1 K/uL  Lipid panel  Result Value Ref Range   Cholesterol 184 0 - 200 mg/dL   Triglycerides 72.5 0.0 - 149.0 mg/dL   HDL 36.64 >40.34 mg/dL   VLDL 74.2 0.0 - 59.5 mg/dL   LDL Cholesterol 638 (H) 0 - 99 mg/dL   Total CHOL/HDL Ratio 5    NonHDL 143.91   Hemoglobin A1c  Result Value Ref Range   Hgb A1c MFr Bld 5.9 4.6 - 6.5 %  Microalbumin / creatinine urine ratio  Result Value Ref Range   Microalb, Ur <0.7 0.0 - 1.9 mg/dL   Creatinine,U 756.4 mg/dL   Microalb Creat Ratio 0.5 0.0 - 30.0 mg/g   No results found.  Assessment and Plan:     ICD-10-CM   1. Healthcare maintenance  Z00.00       Health Maintenance Exam: The patient's preventative maintenance and recommended screening tests for an annual wellness exam were reviewed in full today. Brought up to date unless services declined.  Counselled on the importance of diet, exercise, and its role in overall health and mortality. The patient's FH and SH was reviewed, including their home life, tobacco status, and drug and alcohol status.  Follow-up in 1 year for physical exam or additional follow-up below.  Disposition: No follow-ups on file.  Future Appointments  Date Time Provider Department Center  03/09/2023  9:20 AM Takahiro Godinho, Karleen Hampshire, MD [facility]    No orders of the defined types were placed in this encounter.  There are no discontinued medications. No orders of the defined types were placed in this encounter.   Signed,  Elpidio Galea. Doretta Remmert, MD   Allergies as of 03/09/2023       Reactions   Zantac [  ranitidine Hcl] Rash   Imitrex [sumatriptan] Palpitations   Felt weird        Medication List        Accurate as of March 08, 2023  5:13 PM. If you have any questions, ask your nurse or doctor.          aspirin EC 81 MG tablet Take 81 mg by mouth daily.   benzonatate 200 MG  capsule Commonly known as: TESSALON Take 1 capsule (200 mg total) by mouth 3 (three) times daily as needed for cough.   fluticasone 50 MCG/ACT nasal spray Commonly known as: FLONASE PLACE 1 SPRAY INTO BOTH NOSTRILS 2 (TWO) TIMES DAILY   GLUCOSAMINE-CHONDROITIN PO Take 2 tablets by mouth daily after supper.   irbesartan 150 MG tablet Commonly known as: AVAPRO TAKE 1 TABLET BY MOUTH EVERY DAY   metoprolol tartrate 50 MG tablet Commonly known as: LOPRESSOR Take 1 tablet (50 mg total) by mouth 2 (two) times daily.   OVER THE COUNTER MEDICATION Immetri-2 tablets by mouth daily   OVER THE COUNTER MEDICATION Magnesium, Zinc & Vit D3-2 tablets by mouth two times a day   rosuvastatin 20 MG tablet Commonly known as: CRESTOR Take 20 mg by mouth at bedtime.   Semaglutide (1 MG/DOSE) 4 MG/3ML Sopn Inject 1 mg into the skin once a week.   Semaglutide (2 MG/DOSE) 8 MG/3ML Sopn Inject 2 mg as directed once a week.   traZODone 150 MG tablet Commonly known as: DESYREL TAKE 1 TABLET (150 MG TOTAL) BY MOUTH AT BEDTIME AS NEEDED FOR SLEEP.   Vitamin D3 50 MCG (2000 UT) capsule Take 4,000 Units by mouth daily.

## 2023-03-09 ENCOUNTER — Encounter: Payer: Self-pay | Admitting: Family Medicine

## 2023-03-09 ENCOUNTER — Ambulatory Visit: Payer: No Typology Code available for payment source | Admitting: Family Medicine

## 2023-03-09 VITALS — BP 100/70 | HR 78 | Temp 97.8°F | Ht 67.5 in | Wt 221.2 lb

## 2023-03-09 DIAGNOSIS — F321 Major depressive disorder, single episode, moderate: Secondary | ICD-10-CM | POA: Diagnosis not present

## 2023-03-09 DIAGNOSIS — Z0000 Encounter for general adult medical examination without abnormal findings: Secondary | ICD-10-CM | POA: Diagnosis not present

## 2023-03-09 MED ORDER — ROSUVASTATIN CALCIUM 20 MG PO TABS: 20.0000 mg | ORAL_TABLET | Freq: Every day | ORAL | 3 refills | Status: DC

## 2023-03-09 MED ORDER — ESCITALOPRAM OXALATE 5 MG PO TABS: 5.0000 mg | ORAL_TABLET | Freq: Every day | ORAL | 3 refills | Status: DC

## 2023-03-11 ENCOUNTER — Other Ambulatory Visit: Payer: Self-pay | Admitting: Family Medicine

## 2023-03-31 ENCOUNTER — Other Ambulatory Visit: Payer: Self-pay | Admitting: Family Medicine

## 2023-05-08 ENCOUNTER — Other Ambulatory Visit: Payer: Self-pay | Admitting: Family Medicine

## 2023-05-08 NOTE — Progress Notes (Deleted)
   Kataleah Bejar T. Aquinnah Devin, MD, CAQ Sports Medicine [facility] [address] [place], [address]  Phone: [phone]  FAX: [phone]  Shaylee Stanislawski - 63 y.o. female  MRN [id number]  Date of Birth: 1960-05-13  Date: 05/09/2023  PCP: Hannah Beat, MD  Referral: Hannah Beat, MD  No chief complaint on file.  Subjective:   Shivaun Bilello is a 63 y.o. very pleasant female patient with There is no height or weight on file to calculate BMI. who presents with the following:  She is a very pleasant patient who I have known for many years, and she presents today for follow-up of anxiety and depression.  I did start her on Lexapro 5 mg at her prior general physical.    Review of Systems is noted in the HPI, as appropriate  Objective:   There were no vitals taken for this visit.  GEN: No acute distress; alert,appropriate. PULM: Breathing comfortably in no respiratory distress PSYCH: Normally interactive.   Laboratory and Imaging Data:  Assessment and Plan:   ***

## 2023-05-09 ENCOUNTER — Ambulatory Visit: Payer: No Typology Code available for payment source | Admitting: Family Medicine

## 2023-05-09 DIAGNOSIS — F321 Major depressive disorder, single episode, moderate: Secondary | ICD-10-CM

## 2023-05-10 ENCOUNTER — Encounter: Payer: Self-pay | Admitting: Family Medicine

## 2023-05-27 ENCOUNTER — Other Ambulatory Visit: Payer: Self-pay | Admitting: Family Medicine

## 2023-05-27 NOTE — Telephone Encounter (Signed)
Patient has been scheduled

## 2023-05-27 NOTE — Telephone Encounter (Signed)
Please call and schedule office visit with Dr. Patsy Lager per last AVS:  Return in about 2 months (around 05/09/2023) for depression and anxiety.

## 2023-06-01 ENCOUNTER — Encounter: Payer: Self-pay | Admitting: Family Medicine

## 2023-06-01 ENCOUNTER — Telehealth (INDEPENDENT_AMBULATORY_CARE_PROVIDER_SITE_OTHER): Payer: No Typology Code available for payment source | Admitting: Family Medicine

## 2023-06-01 VITALS — BP 138/72 | HR 65 | Ht 67.5 in

## 2023-06-01 DIAGNOSIS — F411 Generalized anxiety disorder: Secondary | ICD-10-CM

## 2023-06-01 DIAGNOSIS — F321 Major depressive disorder, single episode, moderate: Secondary | ICD-10-CM | POA: Diagnosis not present

## 2023-06-01 MED ORDER — ESCITALOPRAM OXALATE 10 MG PO TABS: 10.0000 mg | ORAL_TABLET | Freq: Every day | ORAL | 1 refills | Status: DC

## 2023-06-01 NOTE — Progress Notes (Unsigned)
   July Nickson T. Eda Magnussen, MD, CAQ Sports Medicine [facility] [address] [place], [address]  Phone: [phone]  FAX: [phone]  Annette Romero - 63 y.o. female  MRN [id number]  Date of Birth: 1960/06/22  Date: 06/01/2023  PCP: Hannah Beat, MD  Referral: Hannah Beat, MD  No chief complaint on file.  Virtual Visit via Video Note:  I connected with  Annette Romero on 06/01/2023 10:20 AM EST by a video enabled telemedicine application and verified that I am speaking with the correct person using two identifiers.   Location patient: home computer, tablet, or smartphone Location provider: work or home office Consent: Verbal consent directly obtained from [employer]. Persons participating in the virtual visit: patient, provider  I discussed the limitations of evaluation and management by telemedicine and the availability of in person appointments. The patient expressed understanding and agreed to proceed.  No chief complaint on file.   History of Present Illness:  She is a very pleasant patient who I have known for many years, and she presents today for follow-up of anxiety and depression.  I did start her on Lexapro 5 mg at her prior general physical.    Review of Systems as above: See pertinent positives and pertinent negatives per HPI No acute distress verbally   Observations/Objective/Exam:  An attempt was made to discern vital signs over the phone and per patient if applicable and possible.   General:    Alert, Oriented, appears well and in no acute distress  Pulmonary:     On inspection no signs of respiratory distress.  Psych / Neurological:     Pleasant and cooperative.  Assessment and Plan:  No diagnosis found.   I discussed the assessment and treatment plan with the patient. The patient was provided an opportunity to ask questions and all were answered. The patient agreed with the plan and demonstrated an  understanding of the instructions.   The patient was advised to call back or seek an in-person evaluation if the symptoms worsen or if the condition fails to improve as anticipated.  Follow-up: prn unless noted otherwise below No follow-ups on file.  No orders of the defined types were placed in this encounter.  No orders of the defined types were placed in this encounter.   Signed,  Elpidio Galea. Evah Rashid, MD

## 2023-07-02 ENCOUNTER — Other Ambulatory Visit: Payer: Self-pay | Admitting: Family Medicine

## 2023-07-04 ENCOUNTER — Other Ambulatory Visit (HOSPITAL_COMMUNITY): Payer: Self-pay

## 2023-07-18 ENCOUNTER — Other Ambulatory Visit (HOSPITAL_COMMUNITY): Payer: Self-pay

## 2023-08-05 ENCOUNTER — Other Ambulatory Visit (HOSPITAL_COMMUNITY): Payer: Self-pay

## 2023-08-22 ENCOUNTER — Telehealth: Payer: Self-pay

## 2023-08-22 ENCOUNTER — Encounter: Payer: Self-pay | Admitting: *Deleted

## 2023-08-22 ENCOUNTER — Other Ambulatory Visit (HOSPITAL_COMMUNITY): Payer: Self-pay

## 2023-08-22 NOTE — Telephone Encounter (Signed)
 Pharmacy Patient Advocate Encounter   Received notification from Patient Pharmacy that prior authorization for Ozempic  8 is required/requested.   Insurance verification completed.   The patient is insured through [employer] .   Per test claim: PA required; PA submitted to above mentioned insurance via CoverMyMeds Key/confirmation #/EOC [id number] Status is pending

## 2023-08-22 NOTE — Telephone Encounter (Signed)
 Pharmacy Patient Advocate Encounter  Received notification from [employer] that Prior Authorization for Ozempic  8 has been APPROVED from 08/22/23 to 08/21/24. Ran test claim, Copay is $24.99. This test claim was processed through [facility]- copay amounts may vary at other pharmacies due to pharmacy/plan contracts, or as the patient moves through the different stages of their insurance plan.   PA #/Case ID/Reference #: [id number]

## 2023-09-01 ENCOUNTER — Other Ambulatory Visit: Payer: Self-pay | Admitting: Family Medicine

## 2023-09-02 ENCOUNTER — Other Ambulatory Visit: Payer: Self-pay | Admitting: Family Medicine

## 2023-10-13 ENCOUNTER — Other Ambulatory Visit: Payer: Self-pay | Admitting: Family Medicine

## 2023-10-20 ENCOUNTER — Ambulatory Visit: Payer: Self-pay | Admitting: *Deleted

## 2023-10-20 NOTE — Telephone Encounter (Signed)
 Appointment with Dr. Watt 10/26/2023

## 2023-10-20 NOTE — Telephone Encounter (Signed)
 Copied from CRM [id number]. Topic: Clinical - Red Word Triage >> Oct 20, 2023  8:21 AM [place] A wrote: [facility] that prompted transfer to Nurse Triage: Right leg is swollen and has pain -has been like this for 3 or 4 days Reason for Disposition  [1] MODERATE leg swelling (e.g., swelling extends up to knees) AND [2] new-onset or getting worse  Answer Assessment - Initial Assessment Questions 1. ONSET: When did the swelling start? (e.g., minutes, hours, days)     I'm having swelling in my right leg on and off for a year.   The last week it's been getting worse.   It's getting worse now. 2. LOCATION: What part of the leg is swollen?  Are both legs swollen or just one leg?     Right leg was swollen bad 2 days ago.   I feel a knot on the back of my leg.  The knot moves up and down.   I'm an LPN.    I wear TED hose and it leaves an indentation.   No pitting edema.  So I'm not sure why it's swollen. 3. SEVERITY: How bad is the swelling? (e.g., localized; mild, moderate, severe)     Knee down it's swollen.    4. REDNESS: Is there redness or signs of infection?     No redness.   It was warm but it was so hot yesterday.   5. PAIN: Is the swelling painful to touch? If Yes, ask: How painful is it?   (Scale 1-10; mild, moderate or severe)     It's painful especially at night.   It feels tight. 6. FEVER: Do you have a fever? If Yes, ask: What is it, how was it measured, and when did it start?      Not asked 7. CAUSE: What do you think is causing the leg swelling?     I'm not sure 8. MEDICAL HISTORY: Do you have a history of blood clots (e.g., DVT), cancer, heart failure, kidney disease, or liver failure?     No history of blood of blood clots.     I broke this ankle a few years ago.   9. RECURRENT SYMPTOM: Have you had leg swelling before? If Yes, ask: When was the last time? What happened that time?     Yes but I had edema with it.   2+ edema at that time.   But no pitting  edema now. 10. OTHER SYMPTOMS: Do you have any other symptoms? (e.g., chest pain, difficulty breathing)       No 11. PREGNANCY: Is there any chance you are pregnant? When was your last menstrual period?       N/A due today  Protocols used: Leg Swelling and Edema-A-AH FYI Only or Action Required?: FYI only for provider.  Patient was last seen in primary care on 06/01/2023 by Watt Mirza, MD.  Called Nurse Triage reporting Leg Swelling.  Symptoms began a week ago.  Interventions attempted: Rest, hydration, or home remedies.  Symptoms are: gradually worsening.  Triage Disposition: See Physician Within 24 Hours  Patient/caregiver understands and will follow disposition?: YesFYI Only or Action Required?: FYI only for provider.  Patient was last seen in primary care on 06/01/2023 by Watt Mirza, MD.  Called Nurse Triage reporting Leg Swelling.  Symptoms began a week ago.  Interventions attempted: Rest, hydration, or home remedies.  Symptoms are: gradually worsening.  Triage Disposition: See Physician Within 24 Hours  Patient/caregiver understands and will follow  disposition?: Yes Pt only wants to see Dr. Watt.  So next available appt made with him.

## 2023-10-25 NOTE — Progress Notes (Deleted)
     Annaelle Kasel T. Issac Moure, MD, CAQ Sports Medicine [facility] [address] [place], [address]  Phone: [phone]  FAX: [phone]  Jeniya Flannigan - 63 y.o. female  MRN [id number]  Date of Birth: [birth year]  Date: 10/26/2023  PCP: Watt Mirza, MD  Referral: Watt Mirza, MD  No chief complaint on file.  Subjective:   Ashlan Dignan is a 63 y.o. very pleasant female patient with There is no height or weight on file to calculate BMI. who presents with the following:  Rhoda is a very nice patient however I recall well over the years.  She presents today for evaluation of ongoing right-sided leg swelling and pain.    Review of Systems is noted in the HPI, as appropriate  Objective:   There were no vitals taken for this visit.  GEN: No acute distress; alert,appropriate. PULM: Breathing comfortably in no respiratory distress PSYCH: Normally interactive.   Laboratory and Imaging Data:  Assessment and Plan:   ***

## 2023-10-26 ENCOUNTER — Ambulatory Visit: Admitting: Family Medicine

## 2023-10-26 DIAGNOSIS — M7989 Other specified soft tissue disorders: Secondary | ICD-10-CM

## 2023-10-28 ENCOUNTER — Ambulatory Visit: Admitting: Family Medicine

## 2023-10-28 ENCOUNTER — Encounter: Payer: Self-pay | Admitting: Family Medicine

## 2023-10-28 ENCOUNTER — Ambulatory Visit: Payer: Self-pay | Admitting: Family Medicine

## 2023-10-28 VITALS — BP 130/84 | HR 65 | Temp 98.2°F | Ht 67.5 in | Wt 211.0 lb

## 2023-10-28 DIAGNOSIS — I10 Essential (primary) hypertension: Secondary | ICD-10-CM

## 2023-10-28 DIAGNOSIS — M79661 Pain in right lower leg: Secondary | ICD-10-CM | POA: Diagnosis not present

## 2023-10-28 DIAGNOSIS — M7989 Other specified soft tissue disorders: Secondary | ICD-10-CM | POA: Diagnosis not present

## 2023-10-28 DIAGNOSIS — E119 Type 2 diabetes mellitus without complications: Secondary | ICD-10-CM | POA: Diagnosis not present

## 2023-10-28 LAB — COMPREHENSIVE METABOLIC PANEL WITH GFR
ALT: 12 U/L (ref 0–35)
AST: 17 U/L (ref 0–37)
Albumin: 3.9 g/dL (ref 3.5–5.2)
Alkaline Phosphatase: 81 U/L (ref 39–117)
BUN: 8 mg/dL (ref 6–23)
CO2: 28 meq/L (ref 19–32)
Calcium: 8.9 mg/dL (ref 8.4–10.5)
Chloride: 109 meq/L (ref 96–112)
Creatinine, Ser: 0.7 mg/dL (ref 0.40–1.20)
GFR: 91.05 mL/min (ref >60.00)
Glucose, Bld: 87 mg/dL (ref 70–99)
Potassium: 3.4 meq/L — ABNORMAL LOW (ref 3.5–5.1)
Sodium: 144 meq/L (ref 135–145)
Total Bilirubin: 0.3 mg/dL (ref 0.2–1.2)
Total Protein: 6.6 g/dL (ref 6.0–8.3)

## 2023-10-28 LAB — HEMOGLOBIN A1C: Hgb A1c MFr Bld: 6.1 % (ref 4.6–6.5)

## 2023-10-28 LAB — MICROALBUMIN / CREATININE URINE RATIO
Creatinine,U: 68.2 mg/dL
Microalb Creat Ratio: UNDETERMINED mg/g (ref 0.0–30.0)
Microalb, Ur: <0.7 mg/dL

## 2023-10-28 LAB — BRAIN NATRIURETIC PEPTIDE: Pro B Natriuretic peptide (BNP): 92 pg/mL (ref 0.0–100.0)

## 2023-10-28 LAB — TSH: TSH: 0.54 u[IU]/mL (ref 0.35–5.50)

## 2023-10-28 NOTE — Assessment & Plan Note (Signed)
 Chronic, she is overdue for evaluation.  Likely improved with significant weight loss.

## 2023-10-28 NOTE — Progress Notes (Signed)
 Patient ID: Annette Romero, female    DOB: [birth year], 63 y.o.   MRN: [id number]  This visit was conducted in person.  BP 130/84   Pulse 65   Temp 98.2 F (36.8 C) (Temporal)   Ht 5' 7.5 (1.715 m)   Wt 211 lb (95.7 kg)   SpO2 98%   BMI 32.56 kg/m    CC:  Chief Complaint  Patient presents with   Leg Swelling    With Numbnes/Tightness    Subjective:   HPI: Annette Romero is a 63 y.o. female with history of hypothyroidism, hypertension, paroxysmal SVT and diabetes. Presenting on 10/28/2023 for Leg Swelling (With Numbnes/Tightness)    New onset  worsening of swelling, right lower leg in last month, ongoing for > 1 year Right leg feel tightness.  No calf pain, Soreness at knee  No issues with other leg.  She feels it may have worsened since on ozempic  in last  last  Since ankle fracture she has noted swelling off and on.   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  She wears compression hose.  Hx of ankle  fracture in 2021, s/p repair.   No new medication.   Since she has lost weight she has noted lightheadedness and BP dropping.. she wonders if she should lower BP meds.    Wt Readings from Last 3 Encounters:  10/28/23 211 lb (95.7 kg)  03/09/23 221 lb 4 oz (100.4 kg)  08/06/22 254 lb (115.2 kg)   Lab Results  Component Value Date   HGBA1C 5.9 03/02/2023         Relevant past medical, surgical, family and social history reviewed and updated as indicated. Interim medical history since our last visit reviewed. Allergies and medications reviewed and updated. Outpatient Medications Prior to Visit  Medication Sig Dispense Refill   Apoaequorin (PREVAGEN PO) Take 1 capsule by mouth daily.     aspirin EC 81 MG tablet Take 81 mg by mouth daily.     CALCIUM  PO Take 1 tablet by mouth daily.     Cholecalciferol (VITAMIN D3) 50 MCG (2000 UT) capsule Take 4,000 Units by mouth daily.     escitalopram  (LEXAPRO ) 10 MG tablet TAKE 1 TABLET BY  MOUTH EVERY DAY 90 tablet 0   irbesartan  (AVAPRO ) 150 MG tablet TAKE 1 TABLET BY MOUTH EVERY DAY 90 tablet 3   metoprolol  tartrate (LOPRESSOR ) 50 MG tablet TAKE 1 TABLET BY MOUTH TWICE A DAY 180 tablet 1   Multiple Vitamins-Minerals (ULTRA MEGA PO) Womens Vitamin Energy/Metabolism 1 tablet by mouth daily     OVER THE COUNTER MEDICATION Magnesium , Zinc & Vit D3-2 tablets by mouth two times a day     rosuvastatin  (CRESTOR ) 20 MG tablet Take 1 tablet (20 mg total) by mouth at bedtime. 90 tablet 3   Semaglutide , 2 MG/DOSE, (OZEMPIC , 2 MG/DOSE,) 8 MG/3ML SOPN INJECT 2 MG AS DIRECTED ONCE A WEEK. 3 mL 5   traZODone  (DESYREL ) 150 MG tablet TAKE 1 TABLET (150 MG TOTAL) BY MOUTH AT BEDTIME AS NEEDED FOR SLEEP. 90 tablet 1   escitalopram  (LEXAPRO ) 5 MG tablet TAKE 1 TABLET (5 MG TOTAL) BY MOUTH DAILY. 90 tablet 1   OVER THE COUNTER MEDICATION Black Seed Oil 10 cc by mouth daily (Patient not taking: Reported on 06/01/2023)     No facility-administered medications prior to visit.     Per HPI unless specifically indicated in ROS section below Review of  Systems Objective:  BP 130/84   Pulse 65   Temp 98.2 F (36.8 C) (Temporal)   Ht 5' 7.5 (1.715 m)   Wt 211 lb (95.7 kg)   SpO2 98%   BMI 32.56 kg/m   Wt Readings from Last 3 Encounters:  10/28/23 211 lb (95.7 kg)  03/09/23 221 lb 4 oz (100.4 kg)  08/06/22 254 lb (115.2 kg)      Physical Exam Constitutional:      General: She is not in acute distress.    Appearance: Normal appearance. She is well-developed. She is not ill-appearing or toxic-appearing.  HENT:     Head: Normocephalic.     Right Ear: Hearing, tympanic membrane, ear canal and external ear normal. Tympanic membrane is not erythematous, retracted or bulging.     Left Ear: Hearing, tympanic membrane, ear canal and external ear normal. Tympanic membrane is not erythematous, retracted or bulging.     Nose: No mucosal edema or rhinorrhea.     Right Sinus: No maxillary sinus tenderness  or frontal sinus tenderness.     Left Sinus: No maxillary sinus tenderness or frontal sinus tenderness.     Mouth/Throat:     Pharynx: Uvula midline.  Eyes:     General: Lids are normal. Lids are everted, no foreign bodies appreciated.     Conjunctiva/sclera: Conjunctivae normal.     Pupils: Pupils are equal, round, and reactive to light.  Neck:     Thyroid : No thyroid  mass or thyromegaly.     Vascular: No carotid bruit.     Trachea: Trachea normal.  Cardiovascular:     Rate and Rhythm: Normal rate and regular rhythm.     Pulses: Normal pulses.          Dorsalis pedis pulses are 2+ on the right side and 2+ on the left side.       Posterior tibial pulses are 2+ on the right side and 2+ on the left side.     Heart sounds: Normal heart sounds, S1 normal and S2 normal. No murmur heard.    No friction rub. No gallop.     Comments:  Mild nonpitting edema right leg Scattered varicose veins right lowe leg Pulmonary:     Effort: Pulmonary effort is normal. No tachypnea or respiratory distress.     Breath sounds: Normal breath sounds. No decreased breath sounds, wheezing, rhonchi or rales.  Abdominal:     General: Bowel sounds are normal.     Palpations: Abdomen is soft.     Tenderness: There is no abdominal tenderness.  Musculoskeletal:     Cervical back: Normal range of motion and neck supple.     Right lower leg: 1+ Edema present.     Left lower leg: No edema.  Skin:    General: Skin is warm and dry.     Findings: No rash.  Neurological:     Mental Status: She is alert.  Psychiatric:        Mood and Affect: Mood is not anxious or depressed.        Speech: Speech normal.        Behavior: Behavior normal. Behavior is cooperative.        Thought Content: Thought content normal.        Judgment: Judgment normal.       Results for orders placed or performed in visit on 03/02/23  VITAMIN D  25 Hydroxy (Vit-D Deficiency, Fractures)   Collection Time: 03/02/23  7:44 AM  Result Value  Ref Range   VITD 53.76 30.00 - 100.00 ng/mL  IBC + Ferritin   Collection Time: 03/02/23  7:44 AM  Result Value Ref Range   Iron 65 42 - 145 ug/dL   Transferrin 830.9 (L) 212.0 - 360.0 mg/dL   Saturation Ratios 72.4 20.0 - 50.0 %   Ferritin 140.5 10.0 - 291.0 ng/mL   TIBC 236.6 (L) 250.0 - 450.0 mcg/dL  Hepatic Function Panel   Collection Time: 03/02/23  7:44 AM  Result Value Ref Range   Total Bilirubin 0.4 0.2 - 1.2 mg/dL   Bilirubin, Direct 0.1 0.0 - 0.3 mg/dL   Alkaline Phosphatase 94 39 - 117 U/L   AST 16 0 - 37 U/L   ALT 8 0 - 35 U/L   Total Protein 6.5 6.0 - 8.3 g/dL   Albumin 3.7 3.5 - 5.2 g/dL  Basic metabolic panel   Collection Time: 03/02/23  7:44 AM  Result Value Ref Range   Sodium 141 135 - 145 mEq/L   Potassium 3.4 (L) 3.5 - 5.1 mEq/L   Chloride 106 96 - 112 mEq/L   CO2 26 19 - 32 mEq/L   Glucose, Bld 104 (H) 70 - 99 mg/dL   BUN 7 6 - 23 mg/dL   Creatinine, Ser 9.24 0.40 - 1.20 mg/dL   GFR 15.79 >39.99 mL/min   Calcium  9.0 8.4 - 10.5 mg/dL  CBC with Differential/Platelet   Collection Time: 03/02/23  7:44 AM  Result Value Ref Range   WBC 6.3 4.0 - 10.5 K/uL   RBC 4.76 3.87 - 5.11 Mil/uL   Hemoglobin 12.2 12.0 - 15.0 g/dL   HCT 62.3 63.9 - 53.9 %   MCV 79.0 78.0 - 100.0 fl   MCHC 32.5 30.0 - 36.0 g/dL   RDW 85.5 88.4 - 84.4 %   Platelets 278.0 150.0 - 400.0 K/uL   Neutrophils Relative % 39.9 (L) 43.0 - 77.0 %   Lymphocytes Relative 51.6 (H) 12.0 - 46.0 %   Monocytes Relative 6.3 3.0 - 12.0 %   Eosinophils Relative 1.6 0.0 - 5.0 %   Basophils Relative 0.6 0.0 - 3.0 %   Neutro Abs 2.5 1.4 - 7.7 K/uL   Lymphs Abs 3.3 0.7 - 4.0 K/uL   Monocytes Absolute 0.4 0.1 - 1.0 K/uL   Eosinophils Absolute 0.1 0.0 - 0.7 K/uL   Basophils Absolute 0.0 0.0 - 0.1 K/uL  Lipid panel   Collection Time: 03/02/23  7:44 AM  Result Value Ref Range   Cholesterol 184 0 - 200 mg/dL   Triglycerides 10.9 0.0 - 149.0 mg/dL   HDL 60.39 >60.99 mg/dL   VLDL 82.1 0.0 - 59.9 mg/dL    LDL Cholesterol 873 (H) 0 - 99 mg/dL   Total CHOL/HDL Ratio 5    NonHDL 143.91   Hemoglobin A1c   Collection Time: 03/02/23  7:44 AM  Result Value Ref Range   Hgb A1c MFr Bld 5.9 4.6 - 6.5 %    Assessment and Plan  Pain and swelling of right lower leg Assessment & Plan: Chronic, with acute worsening Overall this has been going on for greater than a year.  I do not think her symptoms are due to Ozempic . Swelling is likely multifactorial related to past ankle injury as well as her mild varicose veins and likely venous insufficiency.  She is also on her feet a lot during the day.  We will check labs for other causes of peripheral edema. She also notes  some soreness and swelling behind her right kneecap at times that could be a Baker's cyst. There is no focal calf pain/no clear concern for DVT but if symptoms not improving we can always evaluate with ultrasound. Recommended elevating feet above her heart when she is sitting, regular activity and encouraged her to wear compression socks 15 to 30 mmHg compression.  She will follow-up with her PCP in 2 weeks  Orders: -     Comprehensive metabolic panel with GFR -     TSH -     Brain natriuretic peptide  Essential hypertension Assessment & Plan: Chronic, she does note intermittent lightheadedness at times, therefore I do not think she would tolerate a diuretic.  She may be able to decrease her blood pressure medicine given her significant weight loss.  She will follow her blood pressures at home and make a recording and bring them for her PCP to see in the next 2 weeks.   Controlled type 2 diabetes mellitus without complication, without long-term current use of insulin (HCC) Assessment & Plan: Chronic, she is overdue for evaluation.  Likely improved with significant weight loss.  Orders: -     Hemoglobin A1c -     Microalbumin / creatinine urine ratio    Return in about 2 weeks (around 11/11/2023) for DM follow up with PCP [place].    Greig Ring, MD

## 2023-10-28 NOTE — Assessment & Plan Note (Signed)
 Chronic, with acute worsening Overall this has been going on for greater than a year.  I do not think her symptoms are due to Ozempic . Swelling is likely multifactorial related to past ankle injury as well as her mild varicose veins and likely venous insufficiency.  She is also on her feet a lot during the day.  We will check labs for other causes of peripheral edema. She also notes some soreness and swelling behind her right kneecap at times that could be a Baker's cyst. There is no focal calf pain/no clear concern for DVT but if symptoms not improving we can always evaluate with ultrasound. Recommended elevating feet above her heart when she is sitting, regular activity and encouraged her to wear compression socks 15 to 30 mmHg compression.  She will follow-up with her PCP in 2 weeks

## 2023-10-28 NOTE — Assessment & Plan Note (Signed)
 Chronic, she does note intermittent lightheadedness at times, therefore I do not think she would tolerate a diuretic.  She may be able to decrease her blood pressure medicine given her significant weight loss.  She will follow her blood pressures at home and make a recording and bring them for her PCP to see in the next 2 weeks.

## 2023-11-14 ENCOUNTER — Ambulatory Visit: Admitting: Family Medicine

## 2023-11-27 ENCOUNTER — Other Ambulatory Visit: Payer: Self-pay | Admitting: Family Medicine

## 2024-01-25 ENCOUNTER — Emergency Department (HOSPITAL_COMMUNITY)
Admission: EM | Admit: 2024-01-25 | Discharge: 2024-01-25 | Disposition: A | Discharge status: Home / Self Care | Attending: Emergency Medicine | Admitting: Emergency Medicine

## 2024-01-25 ENCOUNTER — Emergency Department (HOSPITAL_COMMUNITY)

## 2024-01-25 ENCOUNTER — Other Ambulatory Visit: Payer: Self-pay

## 2024-01-25 ENCOUNTER — Ambulatory Visit: Payer: Self-pay

## 2024-01-25 DIAGNOSIS — R0789 Other chest pain: Secondary | ICD-10-CM | POA: Insufficient documentation

## 2024-01-25 DIAGNOSIS — S0990XA Unspecified injury of head, initial encounter: Secondary | ICD-10-CM | POA: Diagnosis present

## 2024-01-25 DIAGNOSIS — Z794 Long term (current) use of insulin: Secondary | ICD-10-CM | POA: Diagnosis not present

## 2024-01-25 DIAGNOSIS — Z7982 Long term (current) use of aspirin: Secondary | ICD-10-CM | POA: Diagnosis not present

## 2024-01-25 DIAGNOSIS — E119 Type 2 diabetes mellitus without complications: Secondary | ICD-10-CM | POA: Insufficient documentation

## 2024-01-25 DIAGNOSIS — Z79899 Other long term (current) drug therapy: Secondary | ICD-10-CM | POA: Diagnosis not present

## 2024-01-25 DIAGNOSIS — Y92481 Parking lot as the place of occurrence of the external cause: Secondary | ICD-10-CM | POA: Diagnosis not present

## 2024-01-25 DIAGNOSIS — I10 Essential (primary) hypertension: Secondary | ICD-10-CM | POA: Diagnosis not present

## 2024-01-25 LAB — CBC
HCT: 35.7 % — ABNORMAL LOW (ref 36.0–46.0)
Hemoglobin: 10.9 g/dL — ABNORMAL LOW (ref 12.0–15.0)
MCH: 25.2 pg — ABNORMAL LOW (ref 26.0–34.0)
MCHC: 30.5 g/dL (ref 30.0–36.0)
MCV: 82.4 fL (ref 80.0–100.0)
Platelets: 202 K/uL (ref 150–400)
RBC: 4.33 MIL/uL (ref 3.87–5.11)
RDW: 13.4 % (ref 11.5–15.5)
WBC: 4.2 K/uL (ref 4.0–10.5)
nRBC: 0 % (ref 0.0–0.2)

## 2024-01-25 LAB — TROPONIN T, HIGH SENSITIVITY
Troponin T High Sensitivity: <15 ng/L (ref 0–19)
Troponin T High Sensitivity: <15 ng/L (ref 0–19)

## 2024-01-25 LAB — COMPREHENSIVE METABOLIC PANEL WITH GFR
ALT: 12 U/L (ref 0–44)
AST: 25 U/L (ref 15–41)
Albumin: 4 g/dL (ref 3.5–5.0)
Alkaline Phosphatase: 92 U/L (ref 38–126)
Anion gap: 9 (ref 5–15)
BUN: 7 mg/dL — ABNORMAL LOW (ref 8–23)
CO2: 25 mmol/L (ref 22–32)
Calcium: 9.4 mg/dL (ref 8.9–10.3)
Chloride: 107 mmol/L (ref 98–111)
Creatinine, Ser: 0.8 mg/dL (ref 0.44–1.00)
GFR, Estimated: >60 mL/min (ref >60)
Glucose, Bld: 86 mg/dL (ref 70–99)
Potassium: 3.5 mmol/L (ref 3.5–5.1)
Sodium: 141 mmol/L (ref 135–145)
Total Bilirubin: 0.2 mg/dL (ref 0.0–1.2)
Total Protein: 6.9 g/dL (ref 6.5–8.1)

## 2024-01-25 MED ORDER — IOHEXOL 300 MG/ML  SOLN
75.0000 mL | Freq: Once | INTRAMUSCULAR | Status: AC | PRN
  Administered 2024-01-25: 75 mL via INTRAVENOUS
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
## 2024-01-25 NOTE — Discharge Instructions (Addendum)
 Thank you for letting us  evaluate you today.  Your EKG is without abnormalities.  Your cardiac enzymes were negative.  Your CT imaging of your head, neck, chest were negative for injury.  Chest pain is likely secondary to muscular or bone contusion/bruising.  You may use Tylenol , ibuprofen and admittedly every 6-8 hours as needed for pain.  You may use topical Voltaren gel, lidocaine  patches for topical pain relief.  Return to emergency room if you experience chest pain, shortness of breath, worsening symptoms
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
## 2024-02-01 ENCOUNTER — Ambulatory Visit: Admitting: Family Medicine

## 2024-02-01 ENCOUNTER — Encounter: Payer: Self-pay | Admitting: Family Medicine

## 2024-02-01 VITALS — BP 130/76 | HR 70 | Temp 97.7°F | Ht 67.5 in | Wt 211.5 lb

## 2024-02-01 DIAGNOSIS — Z23 Encounter for immunization: Secondary | ICD-10-CM | POA: Diagnosis not present

## 2024-02-01 DIAGNOSIS — R0789 Other chest pain: Secondary | ICD-10-CM

## 2024-02-02 ENCOUNTER — Encounter: Payer: Self-pay | Admitting: Family Medicine

## 2024-03-10 ENCOUNTER — Other Ambulatory Visit: Payer: Self-pay | Admitting: Family Medicine

## 2024-03-12 ENCOUNTER — Other Ambulatory Visit: Payer: Self-pay | Admitting: Family Medicine

## 2024-03-12 DIAGNOSIS — E119 Type 2 diabetes mellitus without complications: Secondary | ICD-10-CM

## 2024-03-12 DIAGNOSIS — E559 Vitamin D deficiency, unspecified: Secondary | ICD-10-CM

## 2024-03-12 DIAGNOSIS — D508 Other iron deficiency anemias: Secondary | ICD-10-CM

## 2024-03-12 DIAGNOSIS — E1169 Type 2 diabetes mellitus with other specified complication: Secondary | ICD-10-CM

## 2024-03-12 NOTE — Telephone Encounter (Signed)
Please schedule CPE with fasting labs prior for Dr. Copland.  

## 2024-03-13 ENCOUNTER — Other Ambulatory Visit

## 2024-03-14 ENCOUNTER — Other Ambulatory Visit

## 2024-03-14 DIAGNOSIS — E1169 Type 2 diabetes mellitus with other specified complication: Secondary | ICD-10-CM | POA: Diagnosis not present

## 2024-03-14 DIAGNOSIS — E119 Type 2 diabetes mellitus without complications: Secondary | ICD-10-CM

## 2024-03-14 DIAGNOSIS — E559 Vitamin D deficiency, unspecified: Secondary | ICD-10-CM | POA: Diagnosis not present

## 2024-03-14 DIAGNOSIS — D508 Other iron deficiency anemias: Secondary | ICD-10-CM

## 2024-03-14 DIAGNOSIS — E785 Hyperlipidemia, unspecified: Secondary | ICD-10-CM

## 2024-03-14 LAB — HEPATIC FUNCTION PANEL
ALT: 12 U/L (ref 0–35)
AST: 20 U/L (ref 0–37)
Albumin: 4 g/dL (ref 3.5–5.2)
Alkaline Phosphatase: 90 U/L (ref 39–117)
Bilirubin, Direct: 0.1 mg/dL (ref 0.0–0.3)
Total Bilirubin: 0.5 mg/dL (ref 0.2–1.2)
Total Protein: 6.8 g/dL (ref 6.0–8.3)

## 2024-03-14 LAB — BASIC METABOLIC PANEL WITH GFR
BUN: 11 mg/dL (ref 6–23)
CO2: 27 meq/L (ref 19–32)
Calcium: 9.2 mg/dL (ref 8.4–10.5)
Chloride: 106 meq/L (ref 96–112)
Creatinine, Ser: 0.75 mg/dL (ref 0.40–1.20)
GFR: 83.59 mL/min (ref >60.00)
Glucose, Bld: 79 mg/dL (ref 70–99)
Potassium: 3.5 meq/L (ref 3.5–5.1)
Sodium: 141 meq/L (ref 135–145)

## 2024-03-14 LAB — CBC WITH DIFFERENTIAL/PLATELET
Basophils Absolute: 0 K/uL (ref 0.0–0.1)
Basophils Relative: 0.6 % (ref 0.0–3.0)
Eosinophils Absolute: 0.1 K/uL (ref 0.0–0.7)
Eosinophils Relative: 2.2 % (ref 0.0–5.0)
HCT: 35.9 % — ABNORMAL LOW (ref 36.0–46.0)
Hemoglobin: 11.6 g/dL — ABNORMAL LOW (ref 12.0–15.0)
Lymphocytes Relative: 44.9 % (ref 12.0–46.0)
Lymphs Abs: 2.1 K/uL (ref 0.7–4.0)
MCHC: 32.4 g/dL (ref 30.0–36.0)
MCV: 79.1 fl (ref 78.0–100.0)
Monocytes Absolute: 0.4 K/uL (ref 0.1–1.0)
Monocytes Relative: 9.1 % (ref 3.0–12.0)
Neutro Abs: 2 K/uL (ref 1.4–7.7)
Neutrophils Relative %: 43.2 % (ref 43.0–77.0)
Platelets: 222 K/uL (ref 150.0–400.0)
RBC: 4.53 Mil/uL (ref 3.87–5.11)
RDW: 14.5 % (ref 11.5–15.5)
WBC: 4.7 K/uL (ref 4.0–10.5)

## 2024-03-14 LAB — LIPID PANEL
Cholesterol: 136 mg/dL (ref 0–200)
HDL: 51.7 mg/dL (ref >39.00)
LDL Cholesterol: 70 mg/dL (ref 0–99)
NonHDL: 83.82
Total CHOL/HDL Ratio: 3
Triglycerides: 67 mg/dL (ref 0.0–149.0)
VLDL: 13.4 mg/dL (ref 0.0–40.0)

## 2024-03-14 LAB — VITAMIN D 25 HYDROXY (VIT D DEFICIENCY, FRACTURES): VITD: 40.62 ng/mL (ref 30.00–100.00)

## 2024-03-14 LAB — IBC + FERRITIN
Ferritin: 132.6 ng/mL (ref 10.0–291.0)
Iron: 70 ug/dL (ref 42–145)
Saturation Ratios: 25.5 % (ref 20.0–50.0)
TIBC: 274.4 ug/dL (ref 250.0–450.0)
Transferrin: 196 mg/dL — ABNORMAL LOW (ref 212.0–360.0)

## 2024-03-14 LAB — HEMOGLOBIN A1C: Hgb A1c MFr Bld: 5.9 % (ref 4.6–6.5)

## 2024-03-18 NOTE — Progress Notes (Unsigned)
 Annette Romero T. Noga Fogg, MD, CAQ Sports Medicine [facility] at [facility] [address] [place], [address]  Phone: [phone]  FAX: [phone]  Annette Romero - 63 y.o. female  MRN [id number]  Date of Birth: 1960/06/18  Date: 03/21/2024  PCP: Watt Mirza, MD  Referral: Watt Mirza, MD  No chief complaint on file.  Patient Care Team: Watt Mirza, MD as PCP - General (Family Medicine) Darliss Rogue, MD as PCP - Cardiology (Cardiology) Gretta Bonnie RAMAN, MD (Inactive) as Consulting Physician (Endocrinology) Subjective:   Annette Romero is a 63 y.o. pleasant patient who presents with the following:  Discussed the use of AI scribe software for clinical note transcription with the patient, who gave verbal consent to proceed.  History of Present Illness     Health Maintenance Summary Reviewed and updated, unless pt declines services.  Tobacco History Reviewed. Non-smoker Alcohol: No concerns, no excessive use Exercise Habits: Some activity, rec at least 30 mins 5 times a week STD concerns: none Drug Use: None Lumps or breast concerns: no  Eye exam Prevnar Shingrix Mammogram Bone density COVID-vaccine  Diabetes Mellitus: Tolerating Medications: She has done great on Ozempic  Compliance with diet: fair, There is no height or weight on file to calculate BMI. Exercise: minimal / intermittent Avg blood sugars at home: not checking Foot problems: none Hypoglycemia: none No nausea, vomitting, blurred vision, polyuria.  Lab Results  Component Value Date   HGBA1C 5.9 03/14/2024   HGBA1C 6.1 10/28/2023   HGBA1C 5.9 03/02/2023   Lab Results  Component Value Date   MICROALBUR <0.7 10/28/2023   LDLCALC 70 03/14/2024   CREATININE 0.75 03/14/2024    Wt Readings from Last 3 Encounters:  02/01/24 211 lb 8 oz (95.9 kg)  10/28/23 211 lb (95.7 kg)  03/09/23 221 lb 4 oz (100.4 kg)     Health Maintenance  Topic Date Due    OPHTHALMOLOGY EXAM  Never done   Pneumococcal Vaccine: 50+ Years (1 of 2 - PCV) Never done   Zoster Vaccines- Shingrix (1 of 2) Never done   Mammogram  06/23/2019   Bone Density Scan  Never done   COVID-19 Vaccine (3 - 2025-26 season) 12/12/2023   FOOT EXAM  03/08/2024   HEMOGLOBIN A1C  09/12/2024   Diabetic kidney evaluation - Urine ACR  10/27/2024   DTaP/Tdap/Td (2 - Td or Tdap) 01/26/2025   Diabetic kidney evaluation - eGFR measurement  03/14/2025   Colonoscopy  06/10/2027   Influenza Vaccine  Completed   Hepatitis C Screening  Completed   HIV Screening  Completed   Hepatitis B Vaccines 19-59 Average Risk  Aged Out   Meningococcal B Vaccine  Aged Out    Immunization History  Administered Date(s) Administered   INFLUENZA, HIGH DOSE SEASONAL PF 02/01/2024   Influenza,inj,Quad PF,6+ Mos 01/27/2015   Influenza-Unspecified 01/20/2016, 01/10/2018   Moderna Sars-Covid-2 Vaccination 05/02/2019, 05/30/2019   Tdap 01/27/2015   Patient Active Problem List   Diagnosis Date Noted   Controlled type 2 diabetes mellitus without complication, without long-term current use of insulin (HCC) 08/18/2020    Priority: High   Hyperthyroidism     Priority: Medium    Essential hypertension     Priority: Medium    Vitamin D  deficiency     Priority: Low   GERD (gastroesophageal reflux disease)     Priority: Low   Pain and swelling of right lower leg 10/28/2023   Depression, major, single episode, moderate (HCC) 03/09/2023  Paroxysmal SVT (supraventricular tachycardia) (HCC) 10/11/2013    Past Medical History:  Diagnosis Date   Closed right trimalleolar fracture 04/30/2016   Controlled type 2 diabetes mellitus without complication, without long-term current use of insulin (HCC) 08/18/2020   GERD (gastroesophageal reflux disease)    Hypertension    Hyperthyroidism    Tachycardia    with increased thyroid  level   Vitamin D  deficiency     Past Surgical History:  Procedure Laterality Date    BREAST BIOPSY  1974   breast cyst   BREAST CYST ASPIRATION     BREAST EXCISIONAL BIOPSY Left    at age of 35   CESAREAN SECTION  1991   HARDWARE REMOVAL Right 04/28/2017   Procedure: HARDWARE REMOVAL-RIGHT ANKLE;  Surgeon: Kathlynn Sharper, MD;  Location: [facility] ORS;  Service: Orthopedics;  Laterality: Right;   ORIF ANKLE FRACTURE Right 04/30/2016   Procedure: OPEN REDUCTION INTERNAL FIXATION (ORIF) ANKLE FRACTURE;  Surgeon: Sharper Kathlynn, MD;  Location: [facility] ORS;  Service: Orthopedics;  Laterality: Right;   SVT ABLATION N/A 12/28/2019   Procedure: SVT ABLATION;  Surgeon: Waddell Danelle ORN, MD;  Location: [facility] INVASIVE CV LAB;  Service: Cardiovascular;  Laterality: N/A;   VAGINAL HYSTERECTOMY  2001   heavy bleeding due to fibroids, not cancer    Family History  Problem Relation Age of Onset   CVA Father    Heart attack Father    Heart failure Father    Seizures Mother    Drug abuse Brother        d/c from drugs   Breast cancer Neg Hx     Social History   Social History Narrative   Engineer, Civil (consulting) at [employer]   2 children   Son, 33, lives in [place], [address], [place]. Junior    Past Medical History, Surgical History, Social History, Family History, Problem List, Medications, and Allergies have been reviewed and updated if relevant.  Review of Systems: Pertinent positives are listed above.  Otherwise, a full 14 point review of systems has been done in full and it is negative except where it is noted positive.  Objective:   There were no vitals taken for this visit. Ideal Body Weight:   No results found.    10/28/2023    8:54 AM 03/09/2023    9:29 AM 07/15/2022    9:40 AM 08/18/2020   10:49 AM  Depression screen PHQ 2/9  Decreased Interest 0 3 0 0  Down, Depressed, Hopeless 0 2 0 0  PHQ - 2 Score 0 5 0 0  Altered sleeping 0 3    Tired, decreased energy 3 3    Change in appetite 3 3    Feeling bad or failure about yourself  0 2    Trouble concentrating 1 3     Moving slowly or fidgety/restless 2 3    Suicidal thoughts 0 0    PHQ-9 Score 9  22     Difficult doing work/chores Somewhat difficult        Data saved with a previous flowsheet row definition     GEN: well developed, well nourished, no acute distress Eyes: conjunctiva and lids normal, PERRLA, EOMI ENT: TM clear, nares clear, oral exam WNL Neck: supple, no lymphadenopathy, no thyromegaly, no JVD Pulm: clear to auscultation and percussion, respiratory effort normal CV: regular rate and rhythm, S1-S2, no murmur, rub or gallop, no bruits Chest: no scars, masses, no lumps BREAST: breast exam declined GI:  soft, non-tender; no hepatosplenomegaly, masses; active bowel sounds all quadrants GU: GU exam declined Lymph: no cervical, axillary or inguinal adenopathy MSK: gait normal, muscle tone and strength WNL, no joint swelling, effusions, discoloration, crepitus  SKIN: clear, good turgor, color WNL, no rashes, lesions, or ulcerations Neuro: normal mental status, normal strength, sensation, and motion Psych: alert; oriented to person, place and time, normally interactive and not anxious or depressed in appearance.   All labs reviewed with patient. Results for orders placed or performed in visit on 03/14/24  VITAMIN D  25 Hydroxy (Vit-D Deficiency, Fractures)   Collection Time: 03/14/24 10:16 AM  Result Value Ref Range   VITD 40.62 30.00 - 100.00 ng/mL  IBC + Ferritin   Collection Time: 03/14/24 10:16 AM  Result Value Ref Range   Iron 70 42 - 145 ug/dL   Transferrin 803.9 (L) 212.0 - 360.0 mg/dL   Saturation Ratios 74.4 20.0 - 50.0 %   Ferritin 132.6 10.0 - 291.0 ng/mL   TIBC 274.4 250.0 - 450.0 mcg/dL  Hepatic Function Panel   Collection Time: 03/14/24 10:16 AM  Result Value Ref Range   Total Bilirubin 0.5 0.2 - 1.2 mg/dL   Bilirubin, Direct 0.1 0.0 - 0.3 mg/dL   Alkaline Phosphatase 90 39 - 117 U/L   AST 20 0 - 37 U/L   ALT 12 0 - 35 U/L   Total Protein 6.8 6.0 - 8.3 g/dL    Albumin 4.0 3.5 - 5.2 g/dL  Basic metabolic panel   Collection Time: 03/14/24 10:16 AM  Result Value Ref Range   Sodium 141 135 - 145 mEq/L   Potassium 3.5 3.5 - 5.1 mEq/L   Chloride 106 96 - 112 mEq/L   CO2 27 19 - 32 mEq/L   Glucose, Bld 79 70 - 99 mg/dL   BUN 11 6 - 23 mg/dL   Creatinine, Ser 9.24 0.40 - 1.20 mg/dL   GFR 16.40 >39.99 mL/min   Calcium  9.2 8.4 - 10.5 mg/dL  CBC with Differential/Platelet   Collection Time: 03/14/24 10:16 AM  Result Value Ref Range   WBC 4.7 4.0 - 10.5 K/uL   RBC 4.53 3.87 - 5.11 Mil/uL   Hemoglobin 11.6 (L) 12.0 - 15.0 g/dL   HCT 64.0 (L) 63.9 - 53.9 %   MCV 79.1 78.0 - 100.0 fl   MCHC 32.4 30.0 - 36.0 g/dL   RDW 85.4 88.4 - 84.4 %   Platelets 222.0 150.0 - 400.0 K/uL   Neutrophils Relative % 43.2 43.0 - 77.0 %   Lymphocytes Relative 44.9 12.0 - 46.0 %   Monocytes Relative 9.1 3.0 - 12.0 %   Eosinophils Relative 2.2 0.0 - 5.0 %   Basophils Relative 0.6 0.0 - 3.0 %   Neutro Abs 2.0 1.4 - 7.7 K/uL   Lymphs Abs 2.1 0.7 - 4.0 K/uL   Monocytes Absolute 0.4 0.1 - 1.0 K/uL   Eosinophils Absolute 0.1 0.0 - 0.7 K/uL   Basophils Absolute 0.0 0.0 - 0.1 K/uL  Lipid panel   Collection Time: 03/14/24 10:16 AM  Result Value Ref Range   Cholesterol 136 0 - 200 mg/dL   Triglycerides 32.9 0.0 - 149.0 mg/dL   HDL 48.29 >60.99 mg/dL   VLDL 86.5 0.0 - 59.9 mg/dL   LDL Cholesterol 70 0 - 99 mg/dL   Total CHOL/HDL Ratio 3    NonHDL 83.82   Hemoglobin A1c   Collection Time: 03/14/24 10:16 AM  Result Value Ref Range   Hgb A1c MFr  Bld 5.9 4.6 - 6.5 %   No results found.  Assessment and Plan:     ICD-10-CM   1. Healthcare maintenance  Z00.00      Assessment & Plan   Health Maintenance Exam: The patient's preventative maintenance and recommended screening tests for an annual wellness exam were reviewed in full today. Brought up to date unless services declined.  Counselled on the importance of diet, exercise, and its role in overall health and  mortality. The patient's FH and SH was reviewed, including their home life, tobacco status, and drug and alcohol status.  Follow-up in 1 year for physical exam or additional follow-up below.  Disposition: No follow-ups on file.  Future Appointments  Date Time Provider Department Center  03/21/2024 10:20 AM Tawan Degroote, Jacques, MD [facility] [address]    No orders of the defined types were placed in this encounter.  There are no discontinued medications. No orders of the defined types were placed in this encounter.   Signed,  Jacques DASEN. Ludwin Flahive, MD   Allergies as of 03/21/2024       Reactions   Zantac [ranitidine Hcl] Rash   Imitrex  [sumatriptan ] Palpitations   Felt weird        Medication List        Accurate as of March 18, 2024 11:42 AM. If you have any questions, ask your nurse or doctor.          aspirin EC 81 MG tablet Take 81 mg by mouth daily.   CALCIUM  PO Take 1 tablet by mouth daily.   escitalopram  10 MG tablet Commonly known as: LEXAPRO  TAKE 1 TABLET BY MOUTH EVERY DAY   irbesartan  150 MG tablet Commonly known as: AVAPRO  TAKE 1 TABLET BY MOUTH EVERY DAY   Lidocaine  4 % Oint Apply 1 Application topically daily as needed.   metoprolol  tartrate 50 MG tablet Commonly known as: LOPRESSOR  TAKE 1 TABLET BY MOUTH TWICE A DAY   OVER THE COUNTER MEDICATION Magnesium , Zinc & Vit D3-2 tablets by mouth two times a day   Ozempic  (2 MG/DOSE) 8 MG/3ML Sopn Generic drug: Semaglutide  (2 MG/DOSE) INJECT 2 MG UNDER THE SKIN AS DIRECTED ONCE A WEEK.   PREVAGEN PO Take 1 capsule by mouth daily.   rosuvastatin  20 MG tablet Commonly known as: CRESTOR  Take 1 tablet (20 mg total) by mouth at bedtime.   traZODone  150 MG tablet Commonly known as: DESYREL  TAKE 1 TABLET (150 MG TOTAL) BY MOUTH AT BEDTIME AS NEEDED FOR SLEEP.   ULTRA MEGA PO Womens Vitamin Energy/Metabolism 1 tablet by mouth daily   Vitamin D3 50 MCG (2000 UT) capsule Take 4,000 Units by  mouth daily.   Voltaren 1 % Gel Generic drug: diclofenac Sodium Apply 4 g topically 4 (four) times daily as needed.

## 2024-03-21 ENCOUNTER — Encounter: Payer: Self-pay | Admitting: Family Medicine

## 2024-03-21 ENCOUNTER — Ambulatory Visit: Admitting: Family Medicine

## 2024-03-21 VITALS — BP 118/70 | HR 84 | Temp 98.0°F | Ht 67.0 in | Wt 217.5 lb

## 2024-03-21 DIAGNOSIS — Z23 Encounter for immunization: Secondary | ICD-10-CM | POA: Diagnosis not present

## 2024-03-21 DIAGNOSIS — Z0000 Encounter for general adult medical examination without abnormal findings: Secondary | ICD-10-CM

## 2024-03-21 DIAGNOSIS — Z780 Asymptomatic menopausal state: Secondary | ICD-10-CM

## 2024-03-21 NOTE — Patient Instructions (Signed)
  You do not need a referral to make a mammogram appointment, and you may call to make her own mammogram appointment directly around your schedule.  MAMMOGRAPHY IN [place]:  [facility] [phone] [address] [place], [place] [address]  [facility] (Formerly [facility]) [address] [place], [place] [address] Phone: [phone] Toll Free: [phone]  MAMMOGRAPHY IN [place]:  [facility] ([place] or [place]) [phone] Located on the campus of [facility] ([place])  [facility] ([place] [facility]) [address]  [place], [place] [address]  

## 2024-03-22 ENCOUNTER — Other Ambulatory Visit: Payer: Self-pay | Admitting: Family Medicine

## 2024-04-07 ENCOUNTER — Other Ambulatory Visit: Payer: Self-pay | Admitting: Family Medicine

## 2024-04-20 ENCOUNTER — Other Ambulatory Visit: Payer: Self-pay | Admitting: Family Medicine
